# Patient Record
Sex: Male | Born: 1970
Health system: Southern US, Community
[De-identification: ages and names within clinical notes are randomized; demographics above are authoritative.]

## PROBLEM LIST (undated history)

## (undated) ENCOUNTER — Emergency Department (HOSPITAL_BASED_OUTPATIENT_CLINIC_OR_DEPARTMENT_OTHER): Admission: EM | Payer: Commercial Managed Care - PPO

## (undated) DIAGNOSIS — K859 Acute pancreatitis without necrosis or infection, unspecified: Secondary | ICD-10-CM

## (undated) DIAGNOSIS — R0789 Other chest pain: Secondary | ICD-10-CM

## (undated) DIAGNOSIS — J189 Pneumonia, unspecified organism: Secondary | ICD-10-CM

## (undated) DIAGNOSIS — I4891 Unspecified atrial fibrillation: Secondary | ICD-10-CM

## (undated) DIAGNOSIS — I471 Supraventricular tachycardia, unspecified: Secondary | ICD-10-CM

## (undated) DIAGNOSIS — F419 Anxiety disorder, unspecified: Secondary | ICD-10-CM

## (undated) DIAGNOSIS — I499 Cardiac arrhythmia, unspecified: Secondary | ICD-10-CM

## (undated) DIAGNOSIS — I498 Other specified cardiac arrhythmias: Secondary | ICD-10-CM

## (undated) DIAGNOSIS — J45909 Unspecified asthma, uncomplicated: Secondary | ICD-10-CM

## (undated) DIAGNOSIS — R0683 Snoring: Secondary | ICD-10-CM

## (undated) DIAGNOSIS — R002 Palpitations: Secondary | ICD-10-CM

## (undated) DIAGNOSIS — I214 Non-ST elevation (NSTEMI) myocardial infarction: Secondary | ICD-10-CM

## (undated) DIAGNOSIS — R0602 Shortness of breath: Secondary | ICD-10-CM

## (undated) DIAGNOSIS — I456 Pre-excitation syndrome: Secondary | ICD-10-CM

## (undated) DIAGNOSIS — K219 Gastro-esophageal reflux disease without esophagitis: Secondary | ICD-10-CM

## (undated) DIAGNOSIS — E119 Type 2 diabetes mellitus without complications: Secondary | ICD-10-CM

## (undated) HISTORY — DX: Other specified cardiac arrhythmias: I49.8

## (undated) HISTORY — PX: WRIST FRACTURE SURGERY: SHX121

## (undated) HISTORY — DX: Shortness of breath: R06.02

## (undated) HISTORY — DX: Anxiety disorder, unspecified: F41.9

## (undated) HISTORY — DX: Palpitations: R00.2

## (undated) HISTORY — DX: Other chest pain: R07.89

## (undated) HISTORY — DX: Pre-excitation syndrome: I45.6

## (undated) HISTORY — PX: FRACTURE SURGERY: SHX138

## (undated) HISTORY — DX: Snoring: R06.83

## (undated) HISTORY — PX: CARDIAC CATHETERIZATION: SHX172

## (undated) HISTORY — DX: Unspecified atrial fibrillation: I48.91

## (undated) HISTORY — PX: ATRIAL FIBRILLATION ABLATION: SHX5732

## (undated) HISTORY — DX: Morbid (severe) obesity due to excess calories: E66.01

## (undated) HISTORY — DX: Type 2 diabetes mellitus without complications: E11.9

---

## 2009-07-05 ENCOUNTER — Emergency Department (HOSPITAL_COMMUNITY): Admission: EM | Admit: 2009-07-05 | Discharge: 2009-07-06 | Payer: Self-pay | Admitting: Emergency Medicine

## 2010-11-19 LAB — POCT CARDIAC MARKERS
CKMB, poc: 1.4 ng/mL (ref 1.0–8.0)
CKMB, poc: 1.9 ng/mL (ref 1.0–8.0)
Myoglobin, poc: 66.2 ng/mL (ref 12–200)

## 2010-11-19 LAB — CBC
HCT: 44.8 % (ref 39.0–52.0)
Hemoglobin: 15.3 g/dL (ref 13.0–17.0)
MCHC: 34.2 g/dL (ref 30.0–36.0)
MCV: 94.5 fL (ref 78.0–100.0)
RBC: 4.74 MIL/uL (ref 4.22–5.81)

## 2010-11-19 LAB — DIFFERENTIAL
Basophils Relative: 2 % — ABNORMAL HIGH (ref 0–1)
Eosinophils Absolute: 0.2 10*3/uL (ref 0.0–0.7)
Eosinophils Relative: 1 % (ref 0–5)
Monocytes Absolute: 1.2 10*3/uL — ABNORMAL HIGH (ref 0.1–1.0)
Monocytes Relative: 6 % (ref 3–12)

## 2010-11-19 LAB — BASIC METABOLIC PANEL
CO2: 29 mEq/L (ref 19–32)
Chloride: 102 mEq/L (ref 96–112)
GFR calc Af Amer: >60 mL/min (ref >60)
Sodium: 138 mEq/L (ref 135–145)

## 2013-05-01 DIAGNOSIS — R079 Chest pain, unspecified: Secondary | ICD-10-CM | POA: Insufficient documentation

## 2013-07-12 DIAGNOSIS — M25559 Pain in unspecified hip: Secondary | ICD-10-CM | POA: Insufficient documentation

## 2013-07-12 DIAGNOSIS — M87059 Idiopathic aseptic necrosis of unspecified femur: Secondary | ICD-10-CM | POA: Insufficient documentation

## 2013-07-12 DIAGNOSIS — K219 Gastro-esophageal reflux disease without esophagitis: Secondary | ICD-10-CM | POA: Insufficient documentation

## 2014-05-08 ENCOUNTER — Inpatient Hospital Stay (HOSPITAL_COMMUNITY)
Admission: EM | Admit: 2014-05-08 | Discharge: 2014-05-10 | DRG: 309 | Disposition: A | Discharge status: Home / Self Care | Payer: Medicare Other | Attending: Cardiology | Admitting: Cardiology

## 2014-05-08 ENCOUNTER — Emergency Department (HOSPITAL_COMMUNITY): Payer: Medicare Other

## 2014-05-08 ENCOUNTER — Encounter (HOSPITAL_COMMUNITY): Payer: Self-pay | Admitting: Emergency Medicine

## 2014-05-08 DIAGNOSIS — I498 Other specified cardiac arrhythmias: Principal | ICD-10-CM | POA: Diagnosis present

## 2014-05-08 DIAGNOSIS — Z79899 Other long term (current) drug therapy: Secondary | ICD-10-CM

## 2014-05-08 DIAGNOSIS — Z6841 Body Mass Index (BMI) 40.0 and over, adult: Secondary | ICD-10-CM

## 2014-05-08 DIAGNOSIS — F17200 Nicotine dependence, unspecified, uncomplicated: Secondary | ICD-10-CM | POA: Diagnosis present

## 2014-05-08 DIAGNOSIS — I4891 Unspecified atrial fibrillation: Secondary | ICD-10-CM | POA: Diagnosis present

## 2014-05-08 DIAGNOSIS — R0789 Other chest pain: Secondary | ICD-10-CM

## 2014-05-08 DIAGNOSIS — I456 Pre-excitation syndrome: Secondary | ICD-10-CM

## 2014-05-08 DIAGNOSIS — E119 Type 2 diabetes mellitus without complications: Secondary | ICD-10-CM | POA: Diagnosis present

## 2014-05-08 DIAGNOSIS — Z7982 Long term (current) use of aspirin: Secondary | ICD-10-CM

## 2014-05-08 DIAGNOSIS — Z8679 Personal history of other diseases of the circulatory system: Secondary | ICD-10-CM | POA: Diagnosis present

## 2014-05-08 DIAGNOSIS — R0602 Shortness of breath: Secondary | ICD-10-CM

## 2014-05-08 DIAGNOSIS — R002 Palpitations: Secondary | ICD-10-CM

## 2014-05-08 DIAGNOSIS — I471 Supraventricular tachycardia: Secondary | ICD-10-CM

## 2014-05-08 DIAGNOSIS — J45909 Unspecified asthma, uncomplicated: Secondary | ICD-10-CM | POA: Diagnosis present

## 2014-05-08 DIAGNOSIS — F411 Generalized anxiety disorder: Secondary | ICD-10-CM | POA: Diagnosis present

## 2014-05-08 HISTORY — DX: Shortness of breath: R06.02

## 2014-05-08 HISTORY — DX: Other chest pain: R07.89

## 2014-05-08 HISTORY — DX: Palpitations: R00.2

## 2014-05-08 HISTORY — DX: Supraventricular tachycardia, unspecified: I47.10

## 2014-05-08 HISTORY — DX: Supraventricular tachycardia: I47.1

## 2014-05-08 HISTORY — DX: Unspecified asthma, uncomplicated: J45.909

## 2014-05-08 LAB — BASIC METABOLIC PANEL
Anion gap: 15 (ref 5–15)
BUN: 13 mg/dL (ref 6–23)
CHLORIDE: 99 meq/L (ref 96–112)
CO2: 25 mEq/L (ref 19–32)
CREATININE: 0.64 mg/dL (ref 0.50–1.35)
Calcium: 9.1 mg/dL (ref 8.4–10.5)
GFR calc non Af Amer: >90 mL/min (ref >90)
GLUCOSE: 168 mg/dL — AB (ref 70–99)
POTASSIUM: 4.1 meq/L (ref 3.7–5.3)
Sodium: 139 mEq/L (ref 137–147)

## 2014-05-08 LAB — CBC
HEMATOCRIT: 45.5 % (ref 39.0–52.0)
HEMOGLOBIN: 16.2 g/dL (ref 13.0–17.0)
MCH: 31.5 pg (ref 26.0–34.0)
MCHC: 35.6 g/dL (ref 30.0–36.0)
MCV: 88.3 fL (ref 78.0–100.0)
Platelets: 287 10*3/uL (ref 150–400)
RBC: 5.15 MIL/uL (ref 4.22–5.81)
RDW: 12.7 % (ref 11.5–15.5)
WBC: 16.8 10*3/uL — ABNORMAL HIGH (ref 4.0–10.5)

## 2014-05-08 LAB — I-STAT TROPONIN, ED: Troponin i, poc: 0 ng/mL (ref 0.00–0.08)

## 2014-05-08 MED ORDER — DILTIAZEM HCL 100 MG IV SOLR
5.0000 mg/h | INTRAVENOUS | Status: DC
  Administered 2014-05-08: 10 mg/h via INTRAVENOUS
  Administered 2014-05-09: 15 mg/h via INTRAVENOUS
  Administered 2014-05-09: 20 mg/h via INTRAVENOUS
  Administered 2014-05-09 (×2): 15 mg/h via INTRAVENOUS
  Administered 2014-05-09: 20 mg/h via INTRAVENOUS
  Administered 2014-05-10: 15 mg/h via INTRAVENOUS

## 2014-05-08 MED ORDER — DILTIAZEM LOAD VIA INFUSION
10.0000 mg | Freq: Once | INTRAVENOUS | Status: AC
  Administered 2014-05-08: 10 mg via INTRAVENOUS
  Filled 2014-05-08: qty 10

## 2014-05-08 NOTE — ED Notes (Signed)
Pt in NSR, hr 82 after Cardizem bolus; pads in place. Reports hx of defibrillation in the past for SVT. Has had extensive workup for same without etiology. Pt reports cardiologist has started patient on various medications which "control rhythm, then stop."

## 2014-05-08 NOTE — ED Notes (Signed)
Pads placed on pt for frequent runs of SVT

## 2014-05-08 NOTE — ED Notes (Signed)
Pt to ED via [facility] c/o headache, shortness of breath, and chest pain. Pt reports intermittent headache starting today, described as "my head feels like it's going to blow off." Pt intermittently in SVT on assessment; vagals then converts to a-fib. Dr.Delo at bedside

## 2014-05-08 NOTE — ED Provider Notes (Signed)
CSN: [id number]     Arrival date & time 05/08/14  2203 History   First MD Initiated Contact with Patient 05/08/14 2226     Chief Complaint  Patient presents with  . Shortness of Breath  . Headache  . Chest Pain     (Consider location/radiation/quality/duration/timing/severity/associated sxs/prior Treatment) HPI Comments: Patient is a 43 year old male with history of paroxysmal atrial fibrillation status post multiple ablations in the past. He is followed by a cardiologist at [facility]. He presents today with complaints of tightness in his chest, palpitations and shortness of breath that has been occurring intermittently throughout the evening. He feels himself go into A. fib, then converts himself with vagal maneuvers. Shortly thereafter the arrhythmia returns.  Patient is a 43 y.o. male presenting with chest pain. The history is provided by the patient.  Chest Pain Pain location:  Substernal area Pain quality: tightness   Pain radiates to:  Does not radiate Pain radiates to the back: no   Pain severity:  Moderate Onset quality:  Sudden Duration:  6 hours Timing:  Intermittent Progression:  Worsening Chronicity:  Recurrent Relieved by:  Nothing Worsened by:  Nothing tried Ineffective treatments:  None tried   Past Medical History  Diagnosis Date  . Asthma   . SVT (supraventricular tachycardia)    Past Surgical History  Procedure Laterality Date  . Ablation    . Wrist surgery     History reviewed. No pertinent family history. History  Substance Use Topics  . Smoking status: Current Every Day Smoker  . Smokeless tobacco: Not on file  . Alcohol Use: Yes    Review of Systems  Cardiovascular: Positive for chest pain.  All other systems reviewed and are negative.     Allergies  Prednisone  Home Medications   Prior to Admission medications   Not on File   BP 127/64  Pulse 88  Temp(Src) 97.9 F (36.6 C)  Resp 18  SpO2 99% Physical Exam  Nursing note and  vitals reviewed. Constitutional: He is oriented to person, place, and time. He appears well-developed and well-nourished. No distress.  HENT:  Head: Normocephalic and atraumatic.  Mouth/Throat: Oropharynx is clear and moist.  Neck: Normal range of motion. Neck supple.  Cardiovascular: Normal rate, regular rhythm and normal heart sounds.   No murmur heard. Pulmonary/Chest: Effort normal and breath sounds normal. No respiratory distress. He has no wheezes.  Abdominal: Soft. Bowel sounds are normal. He exhibits no distension. There is no tenderness.  Musculoskeletal: Normal range of motion. He exhibits no edema.  Lymphadenopathy:    He has no cervical adenopathy.  Neurological: He is alert and oriented to person, place, and time.  Skin: Skin is warm and dry. He is not diaphoretic.    ED Course  Procedures (including critical care time) Labs Review Labs Reviewed - No data to display  Imaging Review No results found.   Date: 05/10/2014  Rate: 90's  Rhythm: sinus rhythm  QRS Axis: normal  Intervals: normal  ST/T Wave abnormalities: none  Conduction Disutrbances:none  Narrative Interpretation:   Old EKG Reviewed: unchanged    MDM   Final diagnoses:  None    Patient presents with complaints of palpitations and chest pain.  He has a history of paroxysmal afib and multiple ablations in the past at [facility].  He has gone in and out of afib with RVR on several occasions since arriving to the ED.  Labs are essentially unremarkable.  Troponin is negative.  I have initiated  a cardizem drip and will consult cardiology for admission.  CRITICAL CARE Performed by: Geoffery Lyons Total critical care time: 30 minutes Critical care time was exclusive of separately billable procedures and treating other patients. Critical care was necessary to treat or prevent imminent or life-threatening deterioration. Critical care was time spent personally by me on the following activities: development of  treatment plan with patient and/or surrogate as well as nursing, discussions with consultants, evaluation of patient's response to treatment, examination of patient, obtaining history from patient or surrogate, ordering and performing treatments and interventions, ordering and review of laboratory studies, ordering and review of radiographic studies, pulse oximetry and re-evaluation of patient's condition.     Geoffery Lyons, MD 05/10/14 [phone]

## 2014-05-09 DIAGNOSIS — R0789 Other chest pain: Secondary | ICD-10-CM | POA: Diagnosis present

## 2014-05-09 DIAGNOSIS — F17200 Nicotine dependence, unspecified, uncomplicated: Secondary | ICD-10-CM | POA: Diagnosis present

## 2014-05-09 DIAGNOSIS — J45909 Unspecified asthma, uncomplicated: Secondary | ICD-10-CM | POA: Diagnosis present

## 2014-05-09 DIAGNOSIS — I498 Other specified cardiac arrhythmias: Secondary | ICD-10-CM | POA: Diagnosis present

## 2014-05-09 DIAGNOSIS — I4891 Unspecified atrial fibrillation: Secondary | ICD-10-CM | POA: Diagnosis present

## 2014-05-09 DIAGNOSIS — Z6841 Body Mass Index (BMI) 40.0 and over, adult: Secondary | ICD-10-CM | POA: Diagnosis not present

## 2014-05-09 DIAGNOSIS — Z8679 Personal history of other diseases of the circulatory system: Secondary | ICD-10-CM | POA: Diagnosis present

## 2014-05-09 DIAGNOSIS — F411 Generalized anxiety disorder: Secondary | ICD-10-CM | POA: Diagnosis present

## 2014-05-09 DIAGNOSIS — Z79899 Other long term (current) drug therapy: Secondary | ICD-10-CM | POA: Diagnosis not present

## 2014-05-09 DIAGNOSIS — I369 Nonrheumatic tricuspid valve disorder, unspecified: Secondary | ICD-10-CM

## 2014-05-09 DIAGNOSIS — I456 Pre-excitation syndrome: Secondary | ICD-10-CM

## 2014-05-09 DIAGNOSIS — Z7982 Long term (current) use of aspirin: Secondary | ICD-10-CM | POA: Diagnosis not present

## 2014-05-09 DIAGNOSIS — E119 Type 2 diabetes mellitus without complications: Secondary | ICD-10-CM | POA: Diagnosis present

## 2014-05-09 LAB — CBC
HEMATOCRIT: 44.8 % (ref 39.0–52.0)
HEMOGLOBIN: 15.6 g/dL (ref 13.0–17.0)
MCH: 31.4 pg (ref 26.0–34.0)
MCHC: 34.8 g/dL (ref 30.0–36.0)
MCV: 90.1 fL (ref 78.0–100.0)
Platelets: 265 10*3/uL (ref 150–400)
RBC: 4.97 MIL/uL (ref 4.22–5.81)
RDW: 13 % (ref 11.5–15.5)
WBC: 15 10*3/uL — ABNORMAL HIGH (ref 4.0–10.5)

## 2014-05-09 LAB — BASIC METABOLIC PANEL
Anion gap: 11 (ref 5–15)
BUN: 12 mg/dL (ref 6–23)
CHLORIDE: 98 meq/L (ref 96–112)
CO2: 30 meq/L (ref 19–32)
CREATININE: 0.81 mg/dL (ref 0.50–1.35)
Calcium: 8.9 mg/dL (ref 8.4–10.5)
GFR calc non Af Amer: >90 mL/min (ref >90)
GLUCOSE: 242 mg/dL — AB (ref 70–99)
Potassium: 4.4 mEq/L (ref 3.7–5.3)
Sodium: 139 mEq/L (ref 137–147)

## 2014-05-09 LAB — MRSA PCR SCREENING: MRSA BY PCR: NEGATIVE

## 2014-05-09 LAB — MAGNESIUM: MAGNESIUM: 1.6 mg/dL (ref 1.5–2.5)

## 2014-05-09 LAB — LIPID PANEL
CHOL/HDL RATIO: 4.1 ratio
Cholesterol: 161 mg/dL (ref 0–200)
HDL: 39 mg/dL — ABNORMAL LOW (ref >39)
LDL Cholesterol: 82 mg/dL (ref 0–99)
TRIGLYCERIDES: 199 mg/dL — AB (ref <150)
VLDL: 40 mg/dL (ref 0–40)

## 2014-05-09 LAB — PROTIME-INR
INR: 1.04 (ref 0.00–1.49)
Prothrombin Time: 13.6 seconds (ref 11.6–15.2)

## 2014-05-09 LAB — T4, FREE: FREE T4: 1.17 ng/dL (ref 0.80–1.80)

## 2014-05-09 LAB — TSH: TSH: 3.09 u[IU]/mL (ref 0.350–4.500)

## 2014-05-09 LAB — HEMOGLOBIN A1C
Hgb A1c MFr Bld: 8.9 % — ABNORMAL HIGH (ref <5.7)
MEAN PLASMA GLUCOSE: 209 mg/dL — AB (ref <117)

## 2014-05-09 MED ORDER — ACETAMINOPHEN 325 MG PO TABS
650.0000 mg | ORAL_TABLET | ORAL | Status: DC | PRN
  Administered 2014-05-09 (×2): 650 mg via ORAL
  Filled 2014-05-09 (×2): qty 2

## 2014-05-09 MED ORDER — ADULT MULTIVITAMIN W/MINERALS CH
1.0000 | ORAL_TABLET | Freq: Every day | ORAL | Status: DC
  Administered 2014-05-09 – 2014-05-10 (×2): 1 via ORAL
  Filled 2014-05-09 (×2): qty 1

## 2014-05-09 MED ORDER — ALPRAZOLAM 0.25 MG PO TABS: 0.2500 mg | ORAL_TABLET | Freq: Three times a day (TID) | ORAL | Status: DC | PRN

## 2014-05-09 MED ORDER — NICOTINE 21 MG/24HR TD PT24
21.0000 mg | MEDICATED_PATCH | Freq: Every day | TRANSDERMAL | Status: DC
  Administered 2014-05-09 – 2014-05-10 (×2): 21 mg via TRANSDERMAL
  Filled 2014-05-09 (×2): qty 1

## 2014-05-09 MED ORDER — LEVALBUTEROL HCL 0.63 MG/3ML IN NEBU
0.6300 mg | INHALATION_SOLUTION | Freq: Four times a day (QID) | RESPIRATORY_TRACT | Status: DC | PRN
  Administered 2014-05-09 – 2014-05-10 (×4): 0.63 mg via RESPIRATORY_TRACT
  Filled 2014-05-09 (×3): qty 3

## 2014-05-09 MED ORDER — MONTELUKAST SODIUM 10 MG PO TABS
10.0000 mg | ORAL_TABLET | Freq: Every day | ORAL | Status: DC
  Administered 2014-05-09: 10 mg via ORAL
  Filled 2014-05-09 (×2): qty 1

## 2014-05-09 MED ORDER — LEVALBUTEROL HCL 0.63 MG/3ML IN NEBU
INHALATION_SOLUTION | RESPIRATORY_TRACT | Status: AC
  Filled 2014-05-09: qty 3

## 2014-05-09 MED ORDER — ALBUTEROL SULFATE (2.5 MG/3ML) 0.083% IN NEBU: 2.5000 mg | INHALATION_SOLUTION | Freq: Four times a day (QID) | RESPIRATORY_TRACT | Status: DC | PRN

## 2014-05-09 MED ORDER — LORATADINE 10 MG PO TABS
10.0000 mg | ORAL_TABLET | Freq: Every day | ORAL | Status: DC
  Administered 2014-05-09 – 2014-05-10 (×2): 10 mg via ORAL
  Filled 2014-05-09 (×2): qty 1

## 2014-05-09 MED ORDER — ONDANSETRON HCL 4 MG/2ML IJ SOLN: 4.0000 mg | Freq: Four times a day (QID) | INTRAMUSCULAR | Status: DC | PRN

## 2014-05-09 MED ORDER — CENTRUM PO CHEW: 1.0000 | CHEWABLE_TABLET | Freq: Every day | ORAL | Status: DC

## 2014-05-09 MED ORDER — DILTIAZEM HCL ER COATED BEADS 180 MG PO CP24
180.0000 mg | ORAL_CAPSULE | Freq: Two times a day (BID) | ORAL | Status: DC
  Administered 2014-05-09 – 2014-05-10 (×3): 180 mg via ORAL
  Filled 2014-05-09 (×5): qty 1

## 2014-05-09 MED ORDER — ALPRAZOLAM 0.5 MG PO TABS: 2.0000 mg | ORAL_TABLET | Freq: Three times a day (TID) | ORAL | Status: DC | PRN

## 2014-05-09 MED ORDER — PANTOPRAZOLE SODIUM 40 MG PO TBEC
40.0000 mg | DELAYED_RELEASE_TABLET | Freq: Every day | ORAL | Status: DC
Start: 2014-05-09 — End: 2014-05-10
  Administered 2014-05-09 – 2014-05-10 (×2): 40 mg via ORAL
  Filled 2014-05-09 (×2): qty 1

## 2014-05-09 MED ORDER — ASPIRIN EC 81 MG PO TBEC
81.0000 mg | DELAYED_RELEASE_TABLET | Freq: Every day | ORAL | Status: DC
  Administered 2014-05-09 – 2014-05-10 (×2): 81 mg via ORAL
  Filled 2014-05-09 (×2): qty 1

## 2014-05-09 MED ORDER — LEVALBUTEROL TARTRATE 45 MCG/ACT IN AERO: 1.0000 | INHALATION_SPRAY | Freq: Four times a day (QID) | RESPIRATORY_TRACT | Status: DC | PRN

## 2014-05-09 MED ORDER — ALPRAZOLAM 0.5 MG PO TABS
1.0000 mg | ORAL_TABLET | Freq: Three times a day (TID) | ORAL | Status: DC | PRN
  Administered 2014-05-09 (×2): 1 mg via ORAL
  Filled 2014-05-09 (×2): qty 2

## 2014-05-09 MED ORDER — ENOXAPARIN SODIUM 100 MG/ML ~~LOC~~ SOLN
85.0000 mg | SUBCUTANEOUS | Status: DC
  Administered 2014-05-09 – 2014-05-10 (×2): 85 mg via SUBCUTANEOUS
  Filled 2014-05-09 (×3): qty 1

## 2014-05-09 MED ORDER — DRONEDARONE HCL 400 MG PO TABS
400.0000 mg | ORAL_TABLET | Freq: Two times a day (BID) | ORAL | Status: DC
  Administered 2014-05-09 – 2014-05-10 (×3): 400 mg via ORAL
  Filled 2014-05-09 (×5): qty 1

## 2014-05-09 NOTE — H&P (Addendum)
Patient ID: Dean [name] MRN: [id number], DOB/AGE: 1971-02-13   Admit date: 05/08/2014   Primary Physician: No PCP Per Patient Primary Cardiologist: None  Pt. 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   Problem List  Past Medical History  Diagnosis Date  . Asthma   . SVT (supraventricular tachycardia)     Past Surgical History  Procedure Laterality Date  . Ablation    . Wrist surgery       Allergies  Allergies  Allergen Reactions  . Prednisone Other (See Comments)    "goes crazy"    HPI  44M with obesity, WPW and multiple atrial arrhythmias s/p ablation who presents with recurrent atrial arrhythmias.   Dean [name] reports he was diagnosed with WPW as a teenager and underwent an ablation at the age of 77. He reports that over the years he has been plaqued by many atrial arhythmias. He has previously failed flecainide (and he things propafenone) and is now on Multaq but is taking this daily. He has had a total of 6 ablations, all at [facility]. He reports that until a few days ago, he had been arrythmia free since his last ablation 3 years ago.   He states that over the past few days he has felt his "heart racing" like his "head was going to fall off." Episodes had associated chest tightness and SOB. In the setting of recurrent episodes he presented to the ED for evaluation. He was found to have many runs of fast SVT (up to over 200bpm). He was started on a diltizem drip and cardiology was consulted.   Of note, he is a a 1PPD smoker. Denies recreational drugs. Drinks a cup of coffee in the AM, otherwise no caffeine or energy drinks. His fiance states that he frequently snores and has apneic events. He has not been tested for OSA.   Of note, we wishes to transfer his CV care to [facility].   Home Medications  Prior to Admission medications   Medication Sig Start Date End Date Taking? Authorizing  Provider  alprazolam Prudy Feeler) 2 MG tablet Take 2 mg by mouth at bedtime as needed for sleep.    Yes Historical Provider, MD  aspirin 81 MG tablet Take 81 mg by mouth daily.   Yes Historical Provider, MD  budesonide-formoterol (SYMBICORT) 160-4.5 MCG/ACT inhaler Inhale 2 puffs into the lungs daily as needed (shortness of breath).    Yes Historical Provider, MD  cetirizine (ZYRTEC) 10 MG tablet Take 10 mg by mouth daily.   Yes Historical Provider, MD  diltiazem (CARDIZEM CD) 180 MG 24 hr capsule Take 180 mg by mouth 2 (two) times daily.   Yes Historical Provider, MD  dronedarone (MULTAQ) 400 MG tablet Take 400 mg by mouth daily.   Yes Historical Provider, MD  levalbuterol [facility] HFA) 45 MCG/ACT inhaler Inhale 1-2 puffs into the lungs every 6 (six) hours as needed for wheezing or shortness of breath.   Yes Historical Provider, MD  montelukast (SINGULAIR) 10 MG tablet Take 10 mg by mouth at bedtime.   Yes Historical Provider, MD  multivitamin-iron-minerals-folic acid (CENTRUM) chewable tablet Chew 1 tablet by mouth daily.   Yes Historical Provider, MD  omeprazole (PRILOSEC) 20 MG capsule Take 20 mg by mouth 2 (two) times daily.   Yes Historical Provider, MD    Family History  Father and grandmother with fatal MI. Father with unspecified heart rhythm problem.  Social History  History   Social History  . Marital Status: Single    Spouse Name: N/A    Number of Children: N/A  . Years of Education: N/A   Occupational History  . Not on file.   Social History Main Topics  . Smoking status: Current Every Day Smoker  . Smokeless tobacco: Not on file  . Alcohol Use: Yes  . Drug Use: No  . Sexual Activity: Not on file   Other Topics Concern  . Not on file   Social History Narrative  . No narrative on file     Review of Systems General:  No chills, fever, night sweats or weight changes.  Cardiovascular: See HPI Dermatological: No rash, lesions/masses Respiratory: No cough Urologic:  No hematuria, dysuria Abdominal:   No nausea, vomiting, diarrhea, bright red blood per rectum, melena, or hematemesis Neurologic:  No visual changes, wkns, changes in mental status. All other systems reviewed and are otherwise negative except as noted above.  Physical Exam  Blood pressure 124/58, pulse 43, temperature 97.4 F (36.3 C), temperature source Oral, resp. rate 15, height  (1.981 m), weight 170.4 kg (375 lb 10.6 oz), SpO2 93.00%.  General: Pleasant, NAD, obese Psych: Normal affect. Neuro: Alert and oriented X 3. Moves all extremities spontaneously. HEENT: Normal  Neck: Supple without bruits or JVD. Lungs:  Resp regular and unlabored, occasional expiratory wheeze Heart: RRR no s3, s4, or murmurs. Abdomen: Soft, non-tender, non-distended, BS + x 4.  Extremities: No clubbing, cyanosis or edema. DP/PT/Radials 2+ and equal bilaterally.  Labs  Troponin (Point of Care Test)  Recent Labs  05/08/14 2240  TROPIPOC 0.00   No results found for this basename: CKTOTAL, CKMB, TROPONINI,  in the last 72 hours Lab Results  Component Value Date   WBC 16.8* 05/08/2014   HGB 16.2 05/08/2014   HCT 45.5 05/08/2014   MCV 88.3 05/08/2014   PLT 287 05/08/2014    Recent Labs Lab 05/08/14 2222  NA 139  K 4.1  CL 99  CO2 25  BUN 13  CREATININE 0.64  CALCIUM 9.1  GLUCOSE 168*   No results found for this basename: CHOL, HDL, LDLCALC, TRIG   No results found for this basename: DDIMER     Radiology/Studies  Dg Chest Port 1 View  05/08/2014   CLINICAL DATA:  Chest pain and shortness of breath  EXAM: PORTABLE CHEST - 1 VIEW  COMPARISON:  06/1909  FINDINGS: Low lung volumes. Mild bilateral perihilar peribronchial wall thickening. Heart size normal. Vascular pattern normal. Lungs clear without effusion.  IMPRESSION: Mild chronic appearing bronchitic change.  Otherwise negative.   Electronically Signed   By: Esperanza Heir M.D.   On: 05/08/2014 22:55    ECG NSR, PAC. No delta wave.    Tele - Short bursts of AF - Occasional sustained narrow complex long RP tachs - Occasional WCT of unclear etiology  ASSESSMENT AND PLAN 49M with obesity, WPW and multiple atrial arrhythmias s/p ablation who presents with recurrent atrial arrhythmias. Suspect worsened atrial arrhythmias may be related at least in part to OSA. Bursts of AT are now better controlled on diltiazem drip. CHADSVASC = 0.  Plan Continue dilt drip for now along with home oral dilt Increase dronedarone to  BID [facility] records; will need TTE if no recent echo at [facility] EP consult for possible repeat atrial ablation TSH K>4, Mg>2 Outpatient sleep study Nicotine patch and smoking cessation counseling  Signed, Glori Luis, MD 05/09/2014, 3:57 AM

## 2014-05-09 NOTE — ED Notes (Addendum)
Patient went into VT for greater than 20 beats then went into SVT and vagel maneuvers were unsuccessful. Patient became diaphoretic and developed chest pain. He stated he felt like he was going to pass out. Finally after patient beared down he had a pause and converted to NSR. EDP at bedside. Cardiology notified.

## 2014-05-09 NOTE — Consult Note (Signed)
ELECTROPHYSIOLOGY CONSULT NOTE    Patient ID: Dean [name] MRN: [id number], DOB/AGE: [birth year] 41 y.o.  Admit date: 05/08/2014 Date of Consult: 05-09-14  Primary Physician: No PCP Per Patient Primary Cardiologist: previously [facility], would like to transfer to [facility]  Reason for Consultation: atrial arrhythmias  HPI:  Dean [name] is a 43 y.o. male with a past medical history significant for asthma, atrial arrhythmias, and anxiety.  He has been followed at [facility] since he was 43 years old.  He has had a total of 5 or 6 ablations since that time.  The first being around the age of 47 for what he remembers is WPW. He is unsure of the rhythms that were ablated subsequent to that.  He has been maintained on Multaq for the last 2 and 1/2 years and has been relatively well controlled.  Yesterday, he developed tachy-palpitations, chest tightness and shortness of breath.  He came to [name] for further evaluation.  He has been placed on IV Cardizem with reversion to SR.  Telemetry overnight demonstrated atrial fibrillation with RVR, ventricular rates up to 200 as well as a short RP tachycardia with a cycle length of about .    He is sleeping and difficult to arouse this morning.  His significant other provides most of his history.  Per their report, he has previously failed flecainide and propafenone.  Last echo >2 years ago.  He has had a remote sleep study, but does not remember results.  He does snore and has occasional apnea.   He is a current smoker but denies recreational drug use.    TSH and electrolytes are normal.  Echo pending.   EP has been asked to evaluate for treatment options.  ROS is negative except as outlined above.   Past Medical History  Diagnosis Date  . Asthma   . SVT (supraventricular tachycardia)      Surgical History:  Past Surgical History  Procedure Laterality Date  . Ablation    . Wrist surgery       Prescriptions prior to admission  Medication Sig Dispense  Refill  . alprazolam (XANAX) 2 MG tablet Take 2 mg by mouth at bedtime as needed for sleep.       Marland Kitchen aspirin 81 MG tablet Take 81 mg by mouth daily.      . budesonide-formoterol (SYMBICORT) 160-4.5 MCG/ACT inhaler Inhale 2 puffs into the lungs daily as needed (shortness of breath).       . cetirizine (ZYRTEC) 10 MG tablet Take 10 mg by mouth daily.      Marland Kitchen diltiazem (CARDIZEM CD) 180 MG 24 hr capsule Take 180 mg by mouth 2 (two) times daily.      Marland Kitchen dronedarone (MULTAQ) 400 MG tablet Take 400 mg by mouth daily.      Marland Kitchen levalbuterol (XOPENEX HFA) 45 MCG/ACT inhaler Inhale 1-2 puffs into the lungs every 6 (six) hours as needed for wheezing or shortness of breath.      . montelukast (SINGULAIR) 10 MG tablet Take 10 mg by mouth at bedtime.      . multivitamin-iron-minerals-folic acid (CENTRUM) chewable tablet Chew 1 tablet by mouth daily.      Marland Kitchen omeprazole (PRILOSEC) 20 MG capsule Take 20 mg by mouth 2 (two) times daily.        Inpatient Medications:  . aspirin EC  81 mg Oral Daily  . diltiazem  180 mg Oral BID  . dronedarone  400 mg Oral BID WC  . enoxaparin (LOVENOX)  injection  85 mg Subcutaneous Q24H  . levalbuterol      . loratadine  10 mg Oral Daily  . montelukast  10 mg Oral QHS  . multivitamin with minerals  1 tablet Oral Daily  . nicotine  21 mg Transdermal Daily  . pantoprazole  40 mg Oral Daily    Allergies:  Allergies  Allergen Reactions  . Prednisone Other (See Comments)    "goes crazy"    History   Social History  . Marital Status: Single    Spouse Name: N/A    Number of Children: N/A  . Years of Education: N/A   Occupational History  . Not on file.   Social History Main Topics  . Smoking status: Current Every Day Smoker  . Smokeless tobacco: Not on file  . Alcohol Use: Yes  . Drug Use: No  . Sexual Activity: Not on file   Other Topics Concern  . Not on file   Social History Narrative  . No narrative on file     Family History - pt unable to  provide  BP 129/55  Pulse 66  Temp(Src) 97.4 F (36.3 C) (Oral)  Resp 15  Ht  (1.981 m)  Wt 375 lb 10.6 oz (170.4 kg)  BMI 43.42 kg/m2  SpO2 99%  Physical Exam: Morbidly obese appearing 43 yo man, NAD HEENT: Unremarkable,[place], AT Neck:  6 JVD, no thyromegally Back:  No CVA tenderness Lungs:  Clear with no wheezes, rales, or rhonchi HEART:  Regular rate rhythm, no murmurs, no rubs, no clicks Abd:  soft, obese, positive bowel sounds, no organomegally, no rebound, no guarding Ext:  2 plus pulses, no edema, no cyanosis, no clubbing Skin:  No rashes no nodules Neuro:  CN II through XII intact, motor grossly intact    Labs:   Lab Results  Component Value Date   WBC 15.0* 05/09/2014   HGB 15.6 05/09/2014   HCT 44.8 05/09/2014   MCV 90.1 05/09/2014   PLT 265 05/09/2014    Recent Labs Lab 05/09/14 0436  NA 139  K 4.4  CL 98  CO2 30  BUN 12  CREATININE 0.81  CALCIUM 8.9  GLUCOSE 242*    Radiology/Studies: Dg Chest Port 1 View 05/08/2014   CLINICAL DATA:  Chest pain and shortness of breath  EXAM: PORTABLE CHEST - 1 VIEW  COMPARISON:  06/1909  FINDINGS: Low lung volumes. Mild bilateral perihilar peribronchial wall thickening. Heart size normal. Vascular pattern normal. Lungs clear without effusion.  IMPRESSION: Mild chronic appearing bronchitic change.  Otherwise negative.   Electronically Signed   By: Esperanza Heir M.D.   On: 05/08/2014 22:55    GNF:AOZHY rhythm, PACs, normal intervals, QTc 442  TELEMETRY: sinus rhythm, atrial fibrillation with ventricular rates up to 200, short RP tachycardia cycle length  A/P 1. Recurrent SVT and atrial fib 2. WPW syndrome with minimal pre-excitations status post multiple ablations. The patient states that his extra circuit is very close to his normal electrical connection but we do not have any records from [facility]. 3. Morbid obesity 4. DM Rec: He appears to have only been taking Multaq once a day. He has had this  changed to twice daily. I have recommended that he restart twice daily Multaq and we will see how he does. If his arrhythmia cannot be controlled on Multaq, I would switch to amiodarone despite his relative young age. He is physiologically not young. He could be discharge home in the next  24-48 hours if his SVT is controlled.    Leonia Reeves.D.

## 2014-05-09 NOTE — Progress Notes (Signed)
Utilization Review Completed.Dean [name] T9/23/2015  

## 2014-05-09 NOTE — Progress Notes (Signed)
  Echocardiogram 2D Echocardiogram has been performed.  Arvil Chaco 05/09/2014, 11:18 AM

## 2014-05-09 NOTE — Progress Notes (Signed)
eLink Physician-Brief Progress Note Patient Name: Dean [name] DOB: [birth year] MRN: [id number]   Date of Service  05/09/2014  HPI/Events of Note  43 yo man, admitted to ICU with runs of SVT and wide complex tachycardia in setting of HA. He had near syncope on at least one occasion. He is being managed with dilt IV gtt, is stable.   eICU Interventions  No new CCM intervention needed at this time. Will follow on tele     Intervention Category Intermediate Interventions: Other:  Dean [name],Dean S. 05/09/2014, 3:10 AM

## 2014-05-10 DIAGNOSIS — E119 Type 2 diabetes mellitus without complications: Secondary | ICD-10-CM

## 2014-05-10 MED ORDER — DRONEDARONE HCL 400 MG PO TABS: 400.0000 mg | ORAL_TABLET | Freq: Two times a day (BID) | ORAL | Status: DC

## 2014-05-10 NOTE — Progress Notes (Signed)
Patient Name: Dean [name]      SUBJECTIVE:no sighnificant symptons  Past Medical History  Diagnosis Date  . Asthma   . SVT (supraventricular tachycardia)     Scheduled Meds:  Scheduled Meds: . aspirin EC  81 mg Oral Daily  . diltiazem  180 mg Oral BID  . dronedarone  400 mg Oral BID WC  . enoxaparin (LOVENOX) injection  85 mg Subcutaneous Q24H  . loratadine  10 mg Oral Daily  . montelukast  10 mg Oral QHS  . multivitamin with minerals  1 tablet Oral Daily  . nicotine  21 mg Transdermal Daily  . pantoprazole  40 mg Oral Daily   Continuous Infusions: . diltiazem (CARDIZEM) infusion Stopped (05/10/14 0600)   acetaminophen, ALPRAZolam, levalbuterol, ondansetron (ZOFRAN) IV    PHYSICAL EXAM Filed Vitals:   05/10/14 0400 05/10/14 0500 05/10/14 0600 05/10/14 0800  BP: 154/58 151/73 148/73 129/43  Pulse: 70 71 67 69  Temp:      TempSrc:      Resp: Height:      Weight:      SpO2: 93% 96% 96% 96%  Well developed and nourished in no acute distress HENT normal Neck supple with JVP-flat wheezes Regular rate and rhythm, no murmurs or gallops Abd-soft with active BS No Clubbing cyanosis edema Skin-warm and dry A & Oriented  Grossly normal sensory and motor function   TELEMETRY: Reviewed telemetry pt in short runs of short RP tachy   Intake/Output Summary (Last 24 hours) at 05/10/14 0926 Last data filed at 05/10/14 0600  Gross per 24 hour  Intake    865 ml  Output    700 ml  Net    165 ml    LABS: Basic Metabolic Panel:  Recent Labs Lab 05/08/14 2222 05/09/14 0436  NA 139 139  K 4.1 4.4  CL 99 98  CO2 25 30  GLUCOSE 168* 242*  BUN 13 12  CREATININE 0.64 0.81  CALCIUM 9.1 8.9  MG  --  1.6   Cardiac Enzymes: No results found for this basename: CKTOTAL, CKMB, CKMBINDEX, TROPONINI,  in the last 72 hours CBC:  Recent Labs Lab 05/08/14 2222 05/09/14 0436  WBC 16.8* 15.0*  HGB 16.2 15.6  HCT 45.5 44.8  MCV 88.3 90.1    PLT 287 265   PROTIME:  Recent Labs  05/09/14 0436  LABPROT 13.6  INR 1.04   Liver Function Tests: No results found for this basename: AST, ALT, ALKPHOS, BILITOT, PROT, ALBUMIN,  in the last 72 hours No results found for this basename: LIPASE, AMYLASE,  in the last 72 hours BNP: BNP (last 3 results) No results found for this basename: PROBNP,  in the last 8760 hours D-Dimer: No results found for this basename: DDIMER,  in the last 72 hours Hemoglobin A1C:  Recent Labs  05/09/14 0436  HGBA1C 8.9*   Fasting Lipid Panel:  Recent Labs  05/09/14 0436  CHOL 161  HDL 39*  LDLCALC 82  TRIG 409*  CHOLHDL 4.1   Thyroid Function Tests:  Recent Labs  05/09/14 0436  TSH 3.090      ASSESSMENT AND PLAN:  Principal Problem:   SVT (supraventricular tachycardia) Active Problems:   WPW (Wolff-Parkinson-White syndrome)   Type II or unspecified type diabetes mellitus without mention of complication, not stated as uncontrolled  Complex hx   dronaderone resumed  Will discharge to home  And followup with [facility]  in 3-4 weeks Diabetec coordinator consult  Prev lost 80 lbs on low carb diet and DM resolved Signed, Sherryl Manges MD  05/10/2014

## 2014-05-10 NOTE — Discharge Summary (Signed)
CARDIOLOGY DISCHARGE SUMMARY   Patient ID: Dean [name] MRN: [id number] DOB/AGE: 1971-01-10 43 y.o.  Admit date: 05/08/2014 Discharge date: 05/10/2014  PCP: No PCP Per Patient Primary Cardiologist: Dr. Ladona Ridgel  Primary Discharge Diagnosis: SVT (supraventricular tachycardia) Secondary Discharge Diagnosis:    WPW (Wolff-Parkinson-White syndrome)   Type II or unspecified type diabetes mellitus without mention of complication, not stated as uncontrolled  Consults: Electrophysiology  Procedures: 2-D echocardiogram  Hospital Course: Dean [name] is a 43 y.o. male with a history of WPW and multiple atrial arrhythmias with ablation. He was previously followed at [facility]. He had chest tightness and palpitations so he came to the emergency room. He had converted himself with vagal maneuvers several times but the arrhythmia was recurrent. He was found to have recurrent atrial arrhythmias and was admitted for further evaluation and treatment.  He was seen in consult by Dr. Ladona Ridgel with electrophysiology. He was noted to have atrial fibrillation with rapid ventricular response, ventricular rate up to 200 as well as short RP tachycardia, cycle length of 240 ms. He had previously failed flecainide and pop and then. Based on symptomatology, there is also concern for sleep apnea.  His TSH was normal. An echocardiogram was performed that also showed preserved left ventricular function, results below.  He had been on Multaq prior to admission, but was only taking it once a day. It was increased to twice daily, and he will be followed. He could be switched to amiodarone but Dr. Ladona Ridgel was reluctant to do this.  On 09/24, he was seen by Dr. Graciela Husbands and all data were reviewed. His rhythm was more controlled, and he was feeling much better. He was seen by the diabetes coordinator and his A1c was noted to be significantly elevated. He had been on insulin in the past but had been taken off it after his blood  sugars improved. He was encouraged to stick tightly to a diabetic diet and follow up with his primary care physician.  Dr. Graciela Husbands felt no further inpatient workup is indicated and Dean [name] is considered stable for discharge, to follow up as an outpatient.  Labs:   Lab Results  Component Value Date   WBC 15.0* 05/09/2014   HGB 15.6 05/09/2014   HCT 44.8 05/09/2014   MCV 90.1 05/09/2014   PLT 265 05/09/2014     Recent Labs Lab 05/09/14 0436  NA 139  K 4.4  CL 98  CO2 30  BUN 12  CREATININE 0.81  CALCIUM 8.9  GLUCOSE 242*   Lipid Panel     Component Value Date/Time   CHOL 161 05/09/2014 0436   TRIG 199* 05/09/2014 0436   HDL 39* 05/09/2014 0436   CHOLHDL 4.1 05/09/2014 0436   VLDL 40 05/09/2014 0436   LDLCALC 82 05/09/2014 0436    Recent Labs  05/09/14 0436  INR 1.04   Lab Results  Component Value Date   TSH 3.090 05/09/2014   Lab Results  Component Value Date   HGBA1C 8.9* 05/09/2014      Radiology: Dg Chest Port 1 View 05/08/2014   CLINICAL DATA:  Chest pain and shortness of breath  EXAM: PORTABLE CHEST - 1 VIEW  COMPARISON:  06/1909  FINDINGS: Low lung volumes. Mild bilateral perihilar peribronchial wall thickening. Heart size normal. Vascular pattern normal. Lungs clear without effusion.  IMPRESSION: Mild chronic appearing bronchitic change.  Otherwise negative.   Electronically Signed   By: Esperanza Heir M.D.   On: 05/08/2014 22:55  EKG: 05/08/2014 Sinus rhythm Vent. rate 86 BPM PR interval 157 ms QRS duration 85 ms QT/QTc 370/442 ms P-R-T axes 68 68 69  Echo: 05/09/2014 Conclusions - Left ventricle: Abnormal septal motion. The cavity size was normal. Systolic function was normal. The estimated ejection fraction was in the range of 50% to 55%. - Left atrium: The atrium was mildly to moderately dilated. - Atrial septum: No defect or patent foramen ovale was identified.  FOLLOW UP PLANS AND APPOINTMENTS Allergies  Allergen Reactions  . Prednisone Other  (See Comments)    "goes crazy"     Medication List         alprazolam 2 MG tablet  Commonly known as:  XANAX  Take 2 mg by mouth at bedtime as needed for sleep.     aspirin 81 MG tablet  Take 81 mg by mouth daily.     budesonide-formoterol 160-4.5 MCG/ACT inhaler  Commonly known as:  SYMBICORT  Inhale 2 puffs into the lungs daily as needed (shortness of breath).     cetirizine 10 MG tablet  Commonly known as:  ZYRTEC  Take 10 mg by mouth daily.     diltiazem 180 MG 24 hr capsule  Commonly known as:  CARDIZEM CD  Take 180 mg by mouth 2 (two) times daily.     dronedarone 400 MG tablet  Commonly known as:  MULTAQ  Take 1 tablet (400 mg total) by mouth 2 (two) times daily with a meal.     levalbuterol 45 MCG/ACT inhaler  Commonly known as:  XOPENEX HFA  Inhale 1-2 puffs into the lungs every 6 (six) hours as needed for wheezing or shortness of breath.     montelukast 10 MG tablet  Commonly known as:  SINGULAIR  Take 10 mg by mouth at bedtime.     multivitamin-iron-minerals-folic acid chewable tablet  Chew 1 tablet by mouth daily.     omeprazole 20 MG capsule  Commonly known as:  PRILOSEC  Take 20 mg by mouth 2 (two) times daily.        Discharge Instructions   Diet - low sodium heart healthy    Complete by:  As directed      Diet Carb Modified    Complete by:  As directed      Increase activity slowly    Complete by:  As directed           Follow-up Information   Follow up with Lewayne Bunting, MD. (The office will call.)    Specialty:  Cardiology   Contact information:   [address] [place] [address] [phone]       BRING ALL MEDICATIONS WITH YOU TO FOLLOW UP APPOINTMENTS  Time spent with patient to include physician time: 38 min Signed: Theodore Demark, PA-C 05/10/2014, 2:28 PM Co-Sign MD

## 2014-05-10 NOTE — Progress Notes (Signed)
Reviewed AVS with patient and his significant other using teachback method. Pt and significant other deny questions at this time. Belongings including phone and charger sent home with patient. Pt taken out by wheelchair to private vehicle. No complaints at this time.   Dawson Bills, RN

## 2014-05-10 NOTE — Progress Notes (Signed)
Inpatient Diabetes Program Recommendations  AACE/ADA: New Consensus Statement on Inpatient Glycemic Control (2013)  Target Ranges:  Prepandial:   less than 140 mg/dL      Peak postprandial:   less than 180 mg/dL (1-2 hours)      Critically ill patients:  140 - 180 mg/dL   Inpatient Diabetes Program Recommendations HgbA1C: =8.9 This coordinator met with patient to discuss A1C=8.9 and recommend follow-up after discharge with PCP.  Patient reports he has been on insulin before and he has a glucose meter at home.  He has not been monitoring his glucose and has not taken insulin for approximately 3 years because his doctor told him he did not need the insulin anymore.  Recommended lifestyle intervention through healthy eating and exercise.  Also recommended patient monitor glucose at least before breakfast and at bedtime and report to PCP. Patient was given a diabetes meal planning guide. No further questions/concerns at the end of our visit.   Thank you  Raoul Pitch BSN, RN,CDE Inpatient Diabetes Coordinator [phone] (team pager)

## 2014-06-14 ENCOUNTER — Ambulatory Visit: Payer: Medicare Other | Admitting: Internal Medicine

## 2014-06-18 ENCOUNTER — Encounter: Payer: Self-pay | Admitting: Internal Medicine

## 2014-06-18 ENCOUNTER — Ambulatory Visit (INDEPENDENT_AMBULATORY_CARE_PROVIDER_SITE_OTHER): Payer: Medicare Other | Admitting: Internal Medicine

## 2014-06-18 VITALS — BP 160/72 | HR 136 | Ht 78.0 in | Wt 377.6 lb

## 2014-06-18 DIAGNOSIS — I481 Persistent atrial fibrillation: Secondary | ICD-10-CM

## 2014-06-18 DIAGNOSIS — I4819 Other persistent atrial fibrillation: Secondary | ICD-10-CM

## 2014-06-18 NOTE — Patient Instructions (Signed)
Your physician recommends that you schedule a follow-up appointment in: 3-4 months with Dr Taylor  

## 2014-06-18 NOTE — Progress Notes (Signed)
Primary Care Physician: No PCP Per Patient Referring Physician:   [name] is a 43 y.o. male  Here today for EP evaluation with a h/o WPW, s/p several ablations at [facility], morbid obesity, tobacco abuse, ? hypertension and borderline DM. Recently hospitalized for afib with RVR. Recommendations were to increase multaq to 400 mg bid as the patient was taking incorrectly at once a day. He has failed flecainide and tikosyn in the past. Amiodarone was discussed with recent hospitalization but  avoided at this point  due to his young age and possible side effect profile. Chadsvasc score is 1-2 with borderline diabetes, BP today at 160 systolic but he states BP is usually controlled. He states he was on  hctz at one point but it was d/c. He does drink 10-12 beers on the week-end and smokes a pack a day. Drinks many diet mountain dews during the day. Snores, but the wife denies apneic episodes. He does not think he could tolerate mask if sleep study positive. He had BiPAP at hospital and could not tolerate. He did lose a lot of weight several years ago and has lost 4-5 pounds over the last few weeks. Multaq was working well until last few days, when he started noticing afib in the evening. He does feel the palps but is only minimally symptomatic. Can usually break the rhythm with valsalva maneuvers.   Today, he denies symptoms of palpitations, chest pain, shortness of breath, orthopnea, PND, lower extremity edema, dizziness, presyncope, syncope, or neurologic sequela. The patient is tolerating medications without difficulties and is otherwise without complaint today.   Past Medical History  Diagnosis Date  . Asthma   . SVT (supraventricular tachycardia)   . WPW (Wolff-Parkinson-White syndrome)   . Diabetes mellitus, type II   . Atrial arrhythmia     multiples with 5-6 ablations  . Chest tightness 05/08/14  . Palpitations 05/08/14  . Atrial fibrillation with rapid ventricular response       With rates up to 200 as well as short PR tachycardia, cycle length of 240 ms.  . Anxiety   . SOB (shortness of breath) 05/08/14  . Snores     Wil occasional apnea. Had a remote sleep study but does not remember results.  . Morbid obesity    Past Surgical History  Procedure Laterality Date  . Ablation    . Wrist surgery      Current Outpatient Prescriptions  Medication Sig Dispense Refill  . alprazolam (XANAX) 2 MG tablet Take 2 mg by mouth at bedtime as needed for sleep.     Marland Kitchen. aspirin 81 MG tablet Take 81 mg by mouth daily.    . budesonide-formoterol (SYMBICORT) 160-4.5 MCG/ACT inhaler Inhale 2 puffs into the lungs daily as needed (shortness of breath).     . cetirizine (ZYRTEC) 10 MG tablet Take 10 mg by mouth daily.    Marland Kitchen. diltiazem (CARDIZEM CD) 180 MG 24 hr capsule Take 180 mg by mouth 2 (two) times daily.    Marland Kitchen. dronedarone (MULTAQ) 400 MG tablet Take 1 tablet (400 mg total) by mouth 2 (two) times daily with a meal. 60 tablet 6  . levalbuterol (XOPENEX HFA) 45 MCG/ACT inhaler Inhale 1-2 puffs into the lungs every 6 (six) hours as needed for wheezing or shortness of breath.    . montelukast (SINGULAIR) 10 MG tablet Take 10 mg by mouth at bedtime.    . multivitamin-iron-minerals-folic acid (CENTRUM) chewable tablet Chew 1 tablet by mouth daily.    .Marland Kitchen  omeprazole (PRILOSEC) 20 MG capsule Take 20 mg by mouth 2 (two) times daily.     No current facility-administered medications for this visit.    Allergies  Allergen Reactions  . Prednisone Other (See Comments)    "goes crazy"    History   Social History  . Marital Status: Single    Spouse Name: N/A    Number of Children: N/A  . Years of Education: N/A   Occupational History  . Not on file.   Social History Main Topics  . Smoking status: Current Every Day Smoker  . Smokeless tobacco: Not on file  . Alcohol Use: Yes  . Drug Use: No  . Sexual Activity: Not on file   Other Topics Concern  . Not on file   Social  History Narrative    No family history on file.  ROS- All systems are reviewed and negative except as per the HPI above  Physical Exam: Filed Vitals:   06/18/14 1403  BP: 160/72  Pulse: 136  Height: 6\' 6"  (1.981 m)  Weight: 377 lb 9.6 oz (171.278 kg)    GEN- The patient is well appearing, obese, alert and oriented x 3 today.   Head- normocephalic, atraumatic Eyes-  Sclera clear, conjunctiva pink Ears- hearing intact Oropharynx- clear Neck- supple, no JVP Lungs-Scattered wheezes, normal work of breathing. Heart- Regular rate and rhythm, no murmurs, rubs or gallops, PMI not laterally displaced GI- soft, NT, ND, + BS Extremities- no clubbing, cyanosis, or edema MS- no significant deformity or atrophy Skin- no rash or lesion Psych- euthymic mood, full affect Neuro- strength and sensation are intact  EKG- showed SR with brief episode of afib with RVR at 136 bpm.  ECHO-- Left ventricle: Abnormal septal motion. The cavity size was normal. Systolic function was normal. The estimated ejection fraction was in the range of 50% to 55%. - Left atrium: The atrium was mildly to moderately dilated, 45.36  Atrial septum: No defect or patent foramen ovale was identified   Assessment and Plan:  1. WPW s/p ablation at [facility] Patient will sign consent  to have records released to further understand prior procedures. By history he has a midseptal AP, limiting ablation options out of concern for creating complete heart block  2. Afib with RVR Continue Multaq for now. Pt will be very hard to manage due to already failing several antiarrythmic's, s/p several ablations, as well as morbid obesity, tobacco abuse, alcohol and caffeine use, possible sleep apnea. Recommendations are to work on lifestyle efforts as above, continue multaq 400 mg bid, obtain records for further review, and continue with asa for now, with borderline chadsvasc score of 1-2. Ultimately, he will likely  require repeat ablation and may need PPM.  F/u with me in 3 months.

## 2014-06-20 ENCOUNTER — Encounter: Payer: Self-pay | Admitting: Internal Medicine

## 2014-06-25 ENCOUNTER — Encounter: Payer: Self-pay | Admitting: Emergency Medicine

## 2014-10-02 ENCOUNTER — Ambulatory Visit: Payer: Medicare Other | Admitting: Internal Medicine

## 2015-06-03 ENCOUNTER — Telehealth: Payer: Self-pay | Admitting: Internal Medicine

## 2015-06-03 NOTE — Telephone Encounter (Signed)
New Message      Pt's fiance calling stating that pt's PCP wants pt to start taking Zorelto 20 mg and they are wanting to know if that is ok w/ Dr. Ladona Ridgelaylor. Please call back and advise.

## 2015-06-03 NOTE — Telephone Encounter (Signed)
Needs follow up appointment.  Was due to see Dr Ladona Ridgelaylor I Feb 2016 and the appointment was canceled.  I will have Melissa call schedule the patient

## 2015-06-13 ENCOUNTER — Ambulatory Visit: Payer: Medicare Other | Admitting: Internal Medicine

## 2015-07-18 ENCOUNTER — Ambulatory Visit (INDEPENDENT_AMBULATORY_CARE_PROVIDER_SITE_OTHER): Payer: Medicare Other | Admitting: Internal Medicine

## 2015-07-18 ENCOUNTER — Encounter: Payer: Self-pay | Admitting: Internal Medicine

## 2015-07-18 ENCOUNTER — Other Ambulatory Visit: Payer: Self-pay

## 2015-07-18 VITALS — BP 130/82 | HR 150 | Ht 78.0 in | Wt 376.0 lb

## 2015-07-18 DIAGNOSIS — I480 Paroxysmal atrial fibrillation: Secondary | ICD-10-CM | POA: Diagnosis not present

## 2015-07-18 DIAGNOSIS — Z131 Encounter for screening for diabetes mellitus: Secondary | ICD-10-CM | POA: Diagnosis not present

## 2015-07-18 DIAGNOSIS — I10 Essential (primary) hypertension: Secondary | ICD-10-CM | POA: Insufficient documentation

## 2015-07-18 DIAGNOSIS — I456 Pre-excitation syndrome: Secondary | ICD-10-CM

## 2015-07-18 LAB — HEPATIC FUNCTION PANEL
ALT: 55 U/L — ABNORMAL HIGH (ref 9–46)
AST: 37 U/L (ref 10–40)
Albumin: 4.2 g/dL (ref 3.6–5.1)
Alkaline Phosphatase: 133 U/L — ABNORMAL HIGH (ref 40–115)
Bilirubin, Direct: 0.1 mg/dL (ref <=0.2)
Indirect Bilirubin: 0.4 mg/dL (ref 0.2–1.2)
TOTAL PROTEIN: 7 g/dL (ref 6.1–8.1)
Total Bilirubin: 0.5 mg/dL (ref 0.2–1.2)

## 2015-07-18 LAB — BASIC METABOLIC PANEL
BUN: 19 mg/dL (ref 7–25)
CO2: 27 mmol/L (ref 20–31)
Calcium: 9.3 mg/dL (ref 8.6–10.3)
Chloride: 98 mmol/L (ref 98–110)
Creat: 0.87 mg/dL (ref 0.60–1.35)
GLUCOSE: 234 mg/dL — AB (ref 65–99)
Potassium: 5.1 mmol/L (ref 3.5–5.3)
SODIUM: 136 mmol/L (ref 135–146)

## 2015-07-18 LAB — HEMOGLOBIN A1C
Hgb A1c MFr Bld: 9.5 % — ABNORMAL HIGH (ref <5.7)
Mean Plasma Glucose: 226 mg/dL — ABNORMAL HIGH (ref <117)

## 2015-07-18 LAB — TSH: TSH: 2.722 u[IU]/mL (ref 0.350–4.500)

## 2015-07-18 MED ORDER — AMIODARONE HCL 200 MG PO TABS: ORAL_TABLET | ORAL | Status: DC

## 2015-07-18 NOTE — Assessment & Plan Note (Signed)
He is in atrial fib today with a RVR. I am concerned about the development of a tachy induced CM. He has failed multiple AA meds. I have recommended he start Xarelto and amiodarone. He will stop Multaq. I will obtain screening labs. His CHADSVASC score is 1 but I suspect he has DM and will be obtaining a hgb A1C today. I will see him back in 5-6 weeks. I would anticipate checking a 2D echo once his rate is controlled. If he is still out of rhythm, he will need to undergo DCCV.

## 2015-07-18 NOTE — Progress Notes (Signed)
HPI Dean [name] returns today for followup. He is a pleasant 44 yo morbidly obese man with a long h/o tachypalpitations and WPW syndrome. He has undergone multiple catheter ablations but has persistent WPW and has developed worsening atrial fib with a RVR. He does not feel palpitations. He has had progressive PND and orthopnea. The patient got a cold several weeks ago. He denies syncope or chest pain. Minimal peripheral edema. 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  Allergies  Allergen Reactions  . Prednisone Other (See Comments)    "goes crazy"     Current Outpatient Prescriptions  Medication Sig Dispense Refill  . alprazolam (XANAX) 2 MG tablet Take 2 mg by mouth 2 (two) times daily.     Marland Kitchen aspirin 81 MG tablet Take 81 mg by mouth daily.    . budesonide-formoterol (SYMBICORT) 160-4.5 MCG/ACT inhaler Inhale 2 puffs into the lungs daily as needed (shortness of breath).     . cetirizine (ZYRTEC) 10 MG tablet Take 10 mg by mouth daily.    Marland Kitchen diltiazem (CARDIZEM CD) 180 MG 24 hr capsule Take 180 mg by mouth daily.     Marland Kitchen dronedarone (MULTAQ) 400 MG tablet Take 400 mg by mouth daily.    Marland Kitchen levalbuterol (XOPENEX HFA) 45 MCG/ACT inhaler Inhale 1-2 puffs into the lungs every 6 (six) hours as needed for wheezing or shortness of breath.    . montelukast (SINGULAIR) 10 MG tablet Take 10 mg by mouth at bedtime.    . multivitamin-iron-minerals-folic acid (CENTRUM) chewable tablet Chew 1 tablet by mouth daily.    Marland Kitchen omeprazole (PRILOSEC) 20 MG capsule Take 20 mg by mouth 2 (two) times daily.     No current facility-administered medications for this visit.     Past Medical History  Diagnosis Date  . Asthma   . SVT (supraventricular tachycardia) (HCC)   . WPW (Wolff-Parkinson-White syndrome)   . Diabetes mellitus, type II (HCC)   . Atrial arrhythmia     multiples with 5-6 ablations  . Chest tightness 05/08/14  . Palpitations 05/08/14  . Atrial fibrillation with rapid ventricular  response (HCC)     With rates up to 200 as well as short PR tachycardia, cycle length of 240 ms.  . Anxiety   . SOB (shortness of breath) 05/08/14  . Snores     Wil occasional apnea. Had a remote sleep study but does not remember results.  . Morbid obesity (HCC)     ROS:   All systems reviewed and negative except as noted in the HPI.   Past Surgical History  Procedure Laterality Date  . Ablation    . Wrist surgery       No family history on file.   Social History   Social History  . Marital Status: Single    Spouse Name: N/A  . Number of Children: N/A  . Years of Education: N/A   Occupational History  . Not on file.   Social History Main Topics  . Smoking status: Current Every Day Smoker  . Smokeless tobacco: Not on file  . Alcohol Use: Yes  . Drug Use: No  . Sexual Activity: Not on file   Other Topics Concern  . Not on file   Social History Narrative     BP 130/82 mmHg  Pulse 150  Ht  (1.981 m)  Wt 376 lb (170.552 kg)  BMI 43.46 kg/m2  Physical Exam:  obese appearing 44 yo man,  NAD HEENT: Unremarkable Neck:  7 cm JVD, no thyromegally Lymphatics:  No adenopathy Back:  No CVA tenderness Lungs:  Clear with no wheezes HEART:  IRIR rate and rhythm, no murmurs, no rubs, no clicks Abd:  soft, positive bowel sounds, no organomegally, no rebound, no guarding Ext:  2 plus pulses, no edema, no cyanosis, no clubbing Skin:  No rashes no nodules Neuro:  CN II through XII intact, motor grossly intact  EKG - atrial fib with a RVR, minimal pre-excitation  Assess/Plan:

## 2015-07-18 NOTE — Patient Instructions (Signed)
Medication Instructions: 1) Stop Aspirin 2) Stop Multaq 3) Start Xarelto 20 mg one tablet by mouth once daily in the evening 4) Start Amiodarone 200 mg two tablets (400 mg) twice daily x 2 weeks, then decrease to one tablet (200 mg) by mouth twice daily  Labwork: - Your physician recommends that you return for lab work today: HgBA1C, BMP/ TSH/ Liver  Procedures/Testing: - none  Follow-Up: - Your physician recommends that you schedule a follow-up appointment: the first week of January with Dr. Ladona Ridgelaylor  Any Additional Special Instructions Will Be Listed Below (If Applicable). - none

## 2015-07-18 NOTE — Assessment & Plan Note (Signed)
We have started to discuss weight loss. He needs to lose 100 lbs.

## 2015-07-18 NOTE — Assessment & Plan Note (Signed)
I suspect his pre-excitation will go away with amiodarone.

## 2015-07-18 NOTE — Addendum Note (Signed)
Addended bySherri Rad: Klaryssa Fauth C on: 07/18/2015 10:11 AM   Modules accepted: Orders

## 2015-07-18 NOTE — Assessment & Plan Note (Signed)
His blood pressure is controlled. Will follow.  

## 2015-07-23 ENCOUNTER — Telehealth: Payer: Self-pay | Admitting: Internal Medicine

## 2015-07-23 NOTE — Telephone Encounter (Signed)
[name] authorized us to talk with his girlfriend, Dean [name].  She states since starting the Amiodarone he has been very weak.  States he wants to sit in chair all the time.  This AM HR was 90 per pulse Ox.  Has not taken BP. Advised that the medication could make him feel weak initially. Advised to try to reassure him and to try to get him to do some walking around house.  She states she will try to encourage to be more active.  Also advised would route message to Anselm PancoastKelly Lanier,RN, Dr. Lubertha Basqueaylor's nurse for any recommendations.  He is to be on Amiodarone 400 mg BID starting 12/1 for 2 wks then 200 mg BID.

## 2015-07-23 NOTE — Telephone Encounter (Signed)
Girlfriend calling. We do not have a DPR stating we can speak with her. He is not currently with her.  Advised her to have him call us.  She states she will.

## 2015-07-23 NOTE — Telephone Encounter (Signed)
New message     Pt is complaining of weakness and body is "sore".  He is on amiodarone and xarelto----according to girlfriend.  Could these medications be making his feel weak?

## 2015-08-15 ENCOUNTER — Telehealth: Payer: Self-pay | Admitting: Cardiology

## 2015-08-15 NOTE — Telephone Encounter (Signed)
Contacted by [facility], patient in ER with abdominal pain. Cardiology contacted for aflutter with rates of 120s. Patient is hemodynamically stable, primary complaint is regarding his abdominal pain. From chart review he has history of WPW with multiple failed ablations, he has history of afib followed by Dr Ladona Ridgelaylor that has failed tikasyn, flecanide, and multaq. Recently started on amiodarone. I have recommended his workup for abdominal pain be completed in the ER (a CT scan is planned) and if admission is required an admission to medicine for his abdominal pain with cardiology following as consult. Our likely management would be IV loading of amiodarone for his aflutter. If [facility] admitted to [facility] may contact us overnight for consultation.    Dominga FerryJ Charlot Gouin MD

## 2015-08-16 DIAGNOSIS — R109 Unspecified abdominal pain: Secondary | ICD-10-CM | POA: Insufficient documentation

## 2015-08-16 DIAGNOSIS — D751 Secondary polycythemia: Secondary | ICD-10-CM | POA: Insufficient documentation

## 2015-08-16 DIAGNOSIS — J9600 Acute respiratory failure, unspecified whether with hypoxia or hypercapnia: Secondary | ICD-10-CM | POA: Insufficient documentation

## 2015-08-16 DIAGNOSIS — R1013 Epigastric pain: Secondary | ICD-10-CM | POA: Insufficient documentation

## 2015-08-16 DIAGNOSIS — Z7901 Long term (current) use of anticoagulants: Secondary | ICD-10-CM | POA: Insufficient documentation

## 2015-08-16 DIAGNOSIS — K2970 Gastritis, unspecified, without bleeding: Secondary | ICD-10-CM | POA: Insufficient documentation

## 2015-08-16 DIAGNOSIS — Z9889 Other specified postprocedural states: Secondary | ICD-10-CM | POA: Insufficient documentation

## 2015-08-16 DIAGNOSIS — Z8679 Personal history of other diseases of the circulatory system: Secondary | ICD-10-CM | POA: Insufficient documentation

## 2015-08-22 ENCOUNTER — Encounter: Payer: Self-pay | Admitting: Internal Medicine

## 2015-08-22 ENCOUNTER — Ambulatory Visit (INDEPENDENT_AMBULATORY_CARE_PROVIDER_SITE_OTHER): Payer: Medicare Other | Admitting: Internal Medicine

## 2015-08-22 VITALS — HR 78 | Ht 78.0 in | Wt 369.0 lb

## 2015-08-22 DIAGNOSIS — I480 Paroxysmal atrial fibrillation: Secondary | ICD-10-CM

## 2015-08-22 DIAGNOSIS — I10 Essential (primary) hypertension: Secondary | ICD-10-CM

## 2015-08-22 DIAGNOSIS — I456 Pre-excitation syndrome: Secondary | ICD-10-CM | POA: Diagnosis not present

## 2015-08-22 NOTE — Assessment & Plan Note (Signed)
He has minimal pre-excitation on his ECG. Will follow.

## 2015-08-22 NOTE — Progress Notes (Signed)
HPI Dean [name] returns today for followup. He is a pleasant 45 yo morbidly obese man with a long h/o tachypalpitations and WPW syndrome. He has undergone multiple catheter ablations but has persistent WPW and has developed worsening atrial fib with a RVR. He does not feel palpitations. He has had progressive PND and orthopnea.  He denies syncope or chest pain. Minimal peripheral edema. 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 When I saw him last several weeks ago, he was in atrial fib with an RVR and his heart failure symptoms had worsened. I started amiodarone. He developed some gastritis and has been placed on protonix. He is now seeing someone for his diabetes and his sugars are better. He has lost 10 lbs.  Allergies  Allergen Reactions  . Prednisone Other (See Comments)    "goes crazy"     Current Outpatient Prescriptions  Medication Sig Dispense Refill  . alprazolam (XANAX) 2 MG tablet Take 2 mg by mouth 2 (two) times daily.     Marland Kitchen. amiodarone (PACERONE) 200 MG tablet Take 1 tablet by mouth 2 (two) times daily.    . budesonide-formoterol (SYMBICORT) 160-4.5 MCG/ACT inhaler Inhale 2 puffs into the lungs daily as needed (shortness of breath).     . cetirizine (ZYRTEC) 10 MG tablet Take 10 mg by mouth daily.    Marland Kitchen. diltiazem (CARDIZEM CD) 180 MG 24 hr capsule Take 180 mg by mouth daily.     Marland Kitchen. HYDROcodone-acetaminophen (NORCO/VICODIN) 5-325 MG tablet Take 1-2 tablets by mouth every 6 (six) hours as needed.    Marland Kitchen. ipratropium (ATROVENT) 0.02 % nebulizer solution Inhale 250 mcg into the lungs every 4 (four) hours as needed. As needed for shortness of breath    . levalbuterol (XOPENEX HFA) 45 MCG/ACT inhaler Inhale 1-2 puffs into the lungs every 6 (six) hours as needed for wheezing or shortness of breath.    . metFORMIN (GLUCOPHAGE) 1000 MG tablet Take 1 tablet by mouth 2 (two) times daily.    . montelukast (SINGULAIR) 10 MG tablet Take 10 mg by mouth at bedtime.    .  multivitamin-iron-minerals-folic acid (CENTRUM) chewable tablet Chew 1 tablet by mouth daily.    . pantoprazole (PROTONIX) 40 MG tablet Take 40 mg by mouth.    . rivaroxaban (XARELTO) 20 MG TABS tablet Take 1 tablet (20 mg total) by mouth daily with supper.     No current facility-administered medications for this visit.     Past Medical History  Diagnosis Date  . Asthma   . SVT (supraventricular tachycardia) (HCC)   . WPW (Wolff-Parkinson-White syndrome)   . Diabetes mellitus, type II (HCC)   . Atrial arrhythmia     multiples with 5-6 ablations  . Chest tightness 05/08/14  . Palpitations 05/08/14  . Atrial fibrillation with rapid ventricular response (HCC)     With rates up to 200 as well as short PR tachycardia, cycle length of 240 ms.  . Anxiety   . SOB (shortness of breath) 05/08/14  . Snores     Wil occasional apnea. Had a remote sleep study but does not remember results.  . Morbid obesity (HCC)     ROS:   All systems reviewed and negative except as noted in the HPI.   Past Surgical History  Procedure Laterality Date  . Ablation    . Wrist surgery       No family history on file.   Social History   Social History  .  Marital Status: Single    Spouse Name: N/A  . Number of Children: N/A  . Years of Education: N/A   Occupational History  . Not on file.   Social History Main Topics  . Smoking status: Current Every Day Smoker  . Smokeless tobacco: Not on file  . Alcohol Use: Yes  . Drug Use: No  . Sexual Activity: Not on file   Other Topics Concern  . Not on file   Social History Narrative     Ht 6\' 6"  (1.981 m)  Wt 369 lb (167.377 kg)  BMI 42.65 kg/m2  Physical Exam:  obese appearing 45 yo man, NAD HEENT: Unremarkable Neck:  7 cm JVD, no thyromegally Lymphatics:  No adenopathy Back:  No CVA tenderness Lungs:  Clear with no wheezes HEART:  IRIR rate and rhythm, no murmurs, no rubs, no clicks Abd:  soft, positive bowel sounds, no  organomegally, no rebound, no guarding Ext:  2 plus pulses, no edema, no cyanosis, no clubbing Skin:  No rashes no nodules Neuro:  CN II through XII intact, motor grossly intact  EKG - NSR with minimal pre-excitation  Assess/Plan:

## 2015-08-22 NOTE — Assessment & Plan Note (Signed)
His blood pressure was 142/84 by me today. Will follow. He is encouraged to lose weight.

## 2015-08-22 NOTE — Assessment & Plan Note (Signed)
We discussed portion control and the importance of weight loss. He will try and lose weight.

## 2015-08-22 NOTE — Patient Instructions (Signed)
Medication Instructions:  Your physician recommends that you continue on your current medications as directed. Please refer to the Current Medication list given to you today.   Labwork: None ordered   Testing/Procedures: Your physician has requested that you have an echocardiogram. Echocardiography is a painless test that uses sound waves to create images of your heart. It provides your doctor with information about the size and shape of your heart and how well your heart's chambers and valves are working. This procedure takes approximately one hour. There are no restrictions for this procedure.    Follow-Up: Your physician recommends that you schedule a follow-up appointment in: 3 months with Dr Sinda Duaylor---same day as echo   Any Other Special Instructions Will Be Listed Below (If Applicable).     If you need a refill on your cardiac medications before your next appointment, please call your pharmacy.

## 2015-08-22 NOTE — Assessment & Plan Note (Signed)
He is maintaining NSR. He will continue his amiodarone. I plan to reduce his dose in 3 months if he is maintaining NSR.

## 2015-08-23 NOTE — Addendum Note (Signed)
Addended by: Reesa ChewJONES, Demond Shallenberger G on: 08/23/2015 04:48 PM   Modules accepted: Orders

## 2015-09-10 ENCOUNTER — Other Ambulatory Visit: Payer: Self-pay | Admitting: Internal Medicine

## 2015-11-05 ENCOUNTER — Other Ambulatory Visit: Payer: Self-pay | Admitting: Internal Medicine

## 2015-11-19 ENCOUNTER — Ambulatory Visit: Payer: Medicare Other | Admitting: Internal Medicine

## 2015-11-19 ENCOUNTER — Other Ambulatory Visit (HOSPITAL_COMMUNITY): Payer: Medicare Other

## 2015-12-24 ENCOUNTER — Ambulatory Visit: Payer: Medicare Other | Admitting: Internal Medicine

## 2015-12-24 ENCOUNTER — Other Ambulatory Visit (HOSPITAL_COMMUNITY): Payer: Medicare Other

## 2015-12-25 ENCOUNTER — Encounter: Payer: Self-pay | Admitting: Internal Medicine

## 2016-01-10 ENCOUNTER — Encounter: Payer: Self-pay | Admitting: Internal Medicine

## 2016-01-10 ENCOUNTER — Telehealth: Payer: Self-pay | Admitting: Internal Medicine

## 2016-01-10 NOTE — Telephone Encounter (Signed)
Error attempting to send letter

## 2016-03-02 ENCOUNTER — Encounter (INDEPENDENT_AMBULATORY_CARE_PROVIDER_SITE_OTHER): Payer: Self-pay

## 2016-03-02 ENCOUNTER — Other Ambulatory Visit: Payer: Self-pay

## 2016-03-02 ENCOUNTER — Ambulatory Visit (HOSPITAL_COMMUNITY): Payer: Medicare Other | Attending: Cardiology

## 2016-03-02 ENCOUNTER — Ambulatory Visit (INDEPENDENT_AMBULATORY_CARE_PROVIDER_SITE_OTHER): Payer: Medicare Other | Admitting: Internal Medicine

## 2016-03-02 ENCOUNTER — Encounter: Payer: Self-pay | Admitting: Internal Medicine

## 2016-03-02 VITALS — BP 154/86 | HR 81 | Ht 78.75 in | Wt 371.0 lb

## 2016-03-02 DIAGNOSIS — I059 Rheumatic mitral valve disease, unspecified: Secondary | ICD-10-CM | POA: Insufficient documentation

## 2016-03-02 DIAGNOSIS — Z6841 Body Mass Index (BMI) 40.0 and over, adult: Secondary | ICD-10-CM | POA: Insufficient documentation

## 2016-03-02 DIAGNOSIS — Z720 Tobacco use: Secondary | ICD-10-CM | POA: Diagnosis not present

## 2016-03-02 DIAGNOSIS — E119 Type 2 diabetes mellitus without complications: Secondary | ICD-10-CM | POA: Insufficient documentation

## 2016-03-02 DIAGNOSIS — I4891 Unspecified atrial fibrillation: Secondary | ICD-10-CM | POA: Insufficient documentation

## 2016-03-02 DIAGNOSIS — I456 Pre-excitation syndrome: Secondary | ICD-10-CM | POA: Diagnosis not present

## 2016-03-02 DIAGNOSIS — I480 Paroxysmal atrial fibrillation: Secondary | ICD-10-CM

## 2016-03-02 DIAGNOSIS — I119 Hypertensive heart disease without heart failure: Secondary | ICD-10-CM | POA: Diagnosis not present

## 2016-03-02 DIAGNOSIS — Z79899 Other long term (current) drug therapy: Secondary | ICD-10-CM | POA: Diagnosis not present

## 2016-03-02 LAB — ECHOCARDIOGRAM COMPLETE
AOASC: 32 cm
CHL CUP MV DEC (S): 204
EERAT: 6.6
EWDT: 204 ms
FS: 36 % (ref 28–44)
IVS/LV PW RATIO, ED: 1.11
LA diam end sys: 53 mm
LA diam index: 1.71 cm/m2
LA vol index: 20.3 mL/m2
LA vol: 63 mL
LASIZE: 53 mm
LAVOLA4C: 56 mL
LV PW d: 12.3 mm — AB (ref 0.6–1.1)
LV SIMPSON'S DISK: 45
LV TDI E'MEDIAL: 9.47
LV dias vol index: 33 mL/m2
LV e' LATERAL: 12.4 cm/s
LVDIAVOL: 102 mL (ref 62–150)
LVEEAVG: 6.6
LVEEMED: 6.6
LVOT VTI: 18.3 cm
LVOT area: 4.15 cm2
LVOTD: 23 mm
LVOTPV: 104 cm/s
LVOTSV: 76 mL
LVSYSVOL: 56 mL (ref 21–61)
LVSYSVOLIN: 18 mL/m2
MV Peak grad: 3 mmHg
MV pk A vel: 64.4 m/s
MVPKEVEL: 81.8 m/s
RV LATERAL S' VELOCITY: 14.1 cm/s
Stroke v: 46 ml
TDI e' lateral: 12.4

## 2016-03-02 LAB — HEPATIC FUNCTION PANEL
ALK PHOS: 140 U/L — AB (ref 40–115)
ALT: 59 U/L — AB (ref 9–46)
AST: 32 U/L (ref 10–40)
Albumin: 4.1 g/dL (ref 3.6–5.1)
BILIRUBIN DIRECT: 0.1 mg/dL (ref <=0.2)
BILIRUBIN INDIRECT: 0.2 mg/dL (ref 0.2–1.2)
TOTAL PROTEIN: 6.9 g/dL (ref 6.1–8.1)
Total Bilirubin: 0.3 mg/dL (ref 0.2–1.2)

## 2016-03-02 LAB — TSH: TSH: 2.74 mIU/L (ref 0.40–4.50)

## 2016-03-02 MED ORDER — AMIODARONE HCL 200 MG PO TABS: 200.0000 mg | ORAL_TABLET | Freq: Every day | ORAL | Status: DC

## 2016-03-02 NOTE — Progress Notes (Signed)
HPI Mr. Phineas RealMabe returns today for followup. He is a pleasant 45 yo morbidly obese man with a long h/o tachypalpitations and WPW syndrome. He has undergone multiple catheter ablations but has persistent WPW and has developed worsening atrial fib with a RVR. Since being placed on amio, he has improved. No heart racing and no sob. He is outside all day and is quite sun exposed due to the amio.   Allergies  Allergen Reactions  . Prednisone Other (See Comments)    "goes crazy"     Current Outpatient Prescriptions  Medication Sig Dispense Refill  . alprazolam (XANAX) 2 MG tablet Take 2 mg by mouth 2 (two) times daily.     Marland Kitchen. amiodarone (PACERONE) 200 MG tablet Take 1 tablet (200 mg total) by mouth 2 (two) times daily. 60 tablet 9  . budesonide-formoterol (SYMBICORT) 160-4.5 MCG/ACT inhaler Inhale 2 puffs into the lungs daily as needed (shortness of breath).     . cetirizine (ZYRTEC) 10 MG tablet Take 10 mg by mouth daily.    Marland Kitchen. diltiazem (CARDIZEM CD) 180 MG 24 hr capsule Take 180 mg by mouth daily.     Marland Kitchen. HYDROcodone-acetaminophen (NORCO/VICODIN) 5-325 MG tablet Take 1-2 tablets by mouth every 6 (six) hours as needed (pain).     Marland Kitchen. ipratropium (ATROVENT) 0.02 % nebulizer solution Take 250 mcg by nebulization every 4 (four) hours as needed. As needed for shortness of breath    . levalbuterol (XOPENEX HFA) 45 MCG/ACT inhaler Inhale 1-2 puffs into the lungs every 6 (six) hours as needed for wheezing or shortness of breath.    . metFORMIN (GLUCOPHAGE) 1000 MG tablet Take 1 tablet by mouth 2 (two) times daily.    . montelukast (SINGULAIR) 10 MG tablet Take 10 mg by mouth at bedtime.    . multivitamin-iron-minerals-folic acid (CENTRUM) chewable tablet Chew 1 tablet by mouth daily.    . pantoprazole (PROTONIX) 40 MG tablet Take 40 mg by mouth daily.     . rivaroxaban (XARELTO) 20 MG TABS tablet Take 1 tablet (20 mg total) by mouth daily with supper.     No current facility-administered medications  for this visit.     Past Medical History  Diagnosis Date  . Asthma   . SVT (supraventricular tachycardia) (HCC)   . WPW (Wolff-Parkinson-White syndrome)   . Diabetes mellitus, type II (HCC)   . Atrial arrhythmia     multiples with 5-6 ablations  . Chest tightness 05/08/14  . Palpitations 05/08/14  . Atrial fibrillation with rapid ventricular response (HCC)     With rates up to 200 as well as short PR tachycardia, cycle length of 240 ms.  . Anxiety   . SOB (shortness of breath) 05/08/14  . Snores     Wil occasional apnea. Had a remote sleep study but does not remember results.  . Morbid obesity (HCC)     ROS:   All systems reviewed and negative except as noted in the HPI.   Past Surgical History  Procedure Laterality Date  . Ablation    . Wrist surgery       No family history on file.   Social History   Social History  . Marital Status: Single    Spouse Name: N/A  . Number of Children: N/A  . Years of Education: N/A   Occupational History  . Not on file.   Social History Main Topics  . Smoking status: Current Every Day Smoker  . Smokeless tobacco:  Not on file  . Alcohol Use: Yes  . Drug Use: No  . Sexual Activity: Not on file   Other Topics Concern  . Not on file   Social History Narrative     BP 154/86 mmHg  Pulse 81  Ht 6' 6.75" (2 m)  Wt 371 lb (168.284 kg)  BMI 42.07 kg/m2  Physical Exam:  obese appearing 45 yo man, NAD HEENT: Unremarkable Neck:  7 cm JVD, no thyromegally Lymphatics:  No adenopathy Back:  No CVA tenderness Lungs:  Clear with no wheezes HEART:  reg rate and rhythm, no murmurs, no rubs, no clicks Abd:  soft, positive bowel sounds, no organomegally, no rebound, no guarding Ext:  2 plus pulses, no edema, no cyanosis, no clubbing Skin:  No rashes no nodules Neuro:  CN II through XII intact, motor grossly intact  EKG - NSR with no pre-excitation  Assess/Plan: 1. WPW - he is without pre-excitation on his ECG today. Will  reduce his dose of amio to 200 mg daily. Will check TSH and liver panel. 2. PAF - he is maintaining NSR. Continue current meds. 3. Coags - he is tolerating Xarelto 4. HTN - his blood pressure remains elevated. He is encouraged to lose weight  Leonia Reeves.D.

## 2016-03-02 NOTE — Patient Instructions (Signed)
Medication Instructions:  Your physician has recommended you make the following change in your medication:  1) DECREASE Amiodarone to 200 mg once daily  Labwork: Today: TSH and LFT  Testing/Procedures: None ordered  Follow-Up: Your physician wants you to follow-up in: 6 months with Dr. Ladona Ridgelaylor. You will receive a reminder letter in the mail two months in advance. If you don't receive a letter, please call our office to schedule the follow-up appointment.  If you need a refill on your cardiac medications before your next appointment, please call your pharmacy.  Thank you for choosing [facility]!!

## 2016-05-17 ENCOUNTER — Emergency Department (HOSPITAL_COMMUNITY): Payer: Medicare Other

## 2016-05-17 ENCOUNTER — Inpatient Hospital Stay (HOSPITAL_COMMUNITY)
Admission: EM | Admit: 2016-05-17 | Discharge: 2016-05-19 | DRG: 065 | Disposition: A | Discharge status: Home / Self Care | Payer: Medicare Other | Attending: Internal Medicine | Admitting: Internal Medicine

## 2016-05-17 DIAGNOSIS — F17200 Nicotine dependence, unspecified, uncomplicated: Secondary | ICD-10-CM | POA: Diagnosis present

## 2016-05-17 DIAGNOSIS — I10 Essential (primary) hypertension: Secondary | ICD-10-CM | POA: Diagnosis present

## 2016-05-17 DIAGNOSIS — I639 Cerebral infarction, unspecified: Secondary | ICD-10-CM | POA: Diagnosis not present

## 2016-05-17 DIAGNOSIS — D72829 Elevated white blood cell count, unspecified: Secondary | ICD-10-CM | POA: Diagnosis present

## 2016-05-17 DIAGNOSIS — G8194 Hemiplegia, unspecified affecting left nondominant side: Secondary | ICD-10-CM | POA: Diagnosis present

## 2016-05-17 DIAGNOSIS — Y907 Blood alcohol level of 200-239 mg/100 ml: Secondary | ICD-10-CM | POA: Diagnosis present

## 2016-05-17 DIAGNOSIS — I2510 Atherosclerotic heart disease of native coronary artery without angina pectoris: Secondary | ICD-10-CM | POA: Diagnosis present

## 2016-05-17 DIAGNOSIS — I456 Pre-excitation syndrome: Secondary | ICD-10-CM | POA: Diagnosis present

## 2016-05-17 DIAGNOSIS — J449 Chronic obstructive pulmonary disease, unspecified: Secondary | ICD-10-CM | POA: Diagnosis present

## 2016-05-17 DIAGNOSIS — E782 Mixed hyperlipidemia: Secondary | ICD-10-CM | POA: Diagnosis present

## 2016-05-17 DIAGNOSIS — Z7984 Long term (current) use of oral hypoglycemic drugs: Secondary | ICD-10-CM

## 2016-05-17 DIAGNOSIS — Z79899 Other long term (current) drug therapy: Secondary | ICD-10-CM

## 2016-05-17 DIAGNOSIS — I480 Paroxysmal atrial fibrillation: Secondary | ICD-10-CM | POA: Diagnosis present

## 2016-05-17 DIAGNOSIS — F1010 Alcohol abuse, uncomplicated: Secondary | ICD-10-CM | POA: Diagnosis present

## 2016-05-17 DIAGNOSIS — I63311 Cerebral infarction due to thrombosis of right middle cerebral artery: Secondary | ICD-10-CM

## 2016-05-17 DIAGNOSIS — Z6841 Body Mass Index (BMI) 40.0 and over, adult: Secondary | ICD-10-CM

## 2016-05-17 DIAGNOSIS — G4733 Obstructive sleep apnea (adult) (pediatric): Secondary | ICD-10-CM | POA: Diagnosis present

## 2016-05-17 DIAGNOSIS — E1165 Type 2 diabetes mellitus with hyperglycemia: Secondary | ICD-10-CM | POA: Diagnosis present

## 2016-05-17 DIAGNOSIS — R4781 Slurred speech: Secondary | ICD-10-CM | POA: Diagnosis present

## 2016-05-17 DIAGNOSIS — F10129 Alcohol abuse with intoxication, unspecified: Secondary | ICD-10-CM | POA: Diagnosis present

## 2016-05-17 DIAGNOSIS — R29810 Facial weakness: Secondary | ICD-10-CM | POA: Diagnosis present

## 2016-05-17 DIAGNOSIS — G459 Transient cerebral ischemic attack, unspecified: Secondary | ICD-10-CM

## 2016-05-17 DIAGNOSIS — Z8679 Personal history of other diseases of the circulatory system: Secondary | ICD-10-CM

## 2016-05-17 DIAGNOSIS — D751 Secondary polycythemia: Secondary | ICD-10-CM | POA: Diagnosis present

## 2016-05-17 DIAGNOSIS — E119 Type 2 diabetes mellitus without complications: Secondary | ICD-10-CM

## 2016-05-17 DIAGNOSIS — Z8249 Family history of ischemic heart disease and other diseases of the circulatory system: Secondary | ICD-10-CM

## 2016-05-17 DIAGNOSIS — R2990 Unspecified symptoms and signs involving the nervous system: Secondary | ICD-10-CM | POA: Diagnosis present

## 2016-05-17 DIAGNOSIS — K219 Gastro-esophageal reflux disease without esophagitis: Secondary | ICD-10-CM | POA: Diagnosis present

## 2016-05-17 DIAGNOSIS — R911 Solitary pulmonary nodule: Secondary | ICD-10-CM | POA: Diagnosis present

## 2016-05-17 DIAGNOSIS — F419 Anxiety disorder, unspecified: Secondary | ICD-10-CM | POA: Diagnosis present

## 2016-05-17 DIAGNOSIS — R29898 Other symptoms and signs involving the musculoskeletal system: Secondary | ICD-10-CM

## 2016-05-17 DIAGNOSIS — R531 Weakness: Secondary | ICD-10-CM | POA: Diagnosis not present

## 2016-05-17 DIAGNOSIS — Z7901 Long term (current) use of anticoagulants: Secondary | ICD-10-CM

## 2016-05-17 HISTORY — DX: Pneumonia, unspecified organism: J18.9

## 2016-05-17 HISTORY — DX: Gastro-esophageal reflux disease without esophagitis: K21.9

## 2016-05-17 LAB — I-STAT CHEM 8, ED
BUN: 8 mg/dL (ref 6–20)
CALCIUM ION: 0.99 mmol/L — AB (ref 1.15–1.40)
Chloride: 95 mmol/L — ABNORMAL LOW (ref 101–111)
Creatinine, Ser: 1.2 mg/dL (ref 0.61–1.24)
Glucose, Bld: 260 mg/dL — ABNORMAL HIGH (ref 65–99)
HEMATOCRIT: 53 % — AB (ref 39.0–52.0)
HEMOGLOBIN: 18 g/dL — AB (ref 13.0–17.0)
Potassium: 3.9 mmol/L (ref 3.5–5.1)
SODIUM: 134 mmol/L — AB (ref 135–145)
TCO2: 25 mmol/L (ref 0–100)

## 2016-05-17 LAB — I-STAT TROPONIN, ED: TROPONIN I, POC: 0.01 ng/mL (ref 0.00–0.08)

## 2016-05-17 LAB — CBG MONITORING, ED: GLUCOSE-CAPILLARY: 272 mg/dL — AB (ref 65–99)

## 2016-05-17 MED ORDER — IOPAMIDOL (ISOVUE-370) INJECTION 76%
INTRAVENOUS | Status: AC
  Filled 2016-05-17: qty 100

## 2016-05-17 NOTE — ED Provider Notes (Signed)
[facility]-EMERGENCY DEPT Provider Note   CSN: [id number] Arrival date & time: 05/17/16  2333 By signing my name below, I, Levon Hedger, attest that this documentation has been prepared under the direction and in the presence of Tilden Fossa, MD . Electronically Signed: Levon Hedger, Scribe. 05/17/2016. 11:45 PM.   History   Chief Complaint Chief Complaint  Patient presents with  . Code Stroke   Level 5 caveat due to acuity of condition  HPI Dean [name] is a 45 y.o. male with hx of DM, A-fib, and HTN who presents to the Emergency Department complaining of sudden onset left-sided numbness. Per ems, he told his wife he wasn't feeling well, stood up to go to the restroom, and had syncopal episode, falling face first and hitting his head. Pt states he has had an exsisting headache for 3 or 4 days.  Pt also complains of dull, worsening chest pain onset 3 days ago. He rates his pain as an 8/10. Pt endorses alcohol use today, but is unsure of how much. He is taking Xarelto. BP on arrival was 163/80.  The history is provided by the patient and the EMS personnel. The history is limited by the condition of the patient. No language interpreter was used.   Past Medical History:  Diagnosis Date  . Anxiety   . Asthma   . Atrial arrhythmia    multiples with 5-6 ablations  . Atrial fibrillation with rapid ventricular response (HCC)    With rates up to 200 as well as short PR tachycardia, cycle length of 240 ms.  . Chest tightness 05/08/14  . Diabetes mellitus, type II (HCC)   . Morbid obesity (HCC)   . Palpitations 05/08/14  . Snores    Wil occasional apnea. Had a remote sleep study but does not remember results.  . SOB (shortness of breath) 05/08/14  . SVT (supraventricular tachycardia) (HCC)   . WPW (Wolff-Parkinson-White syndrome)     Patient Active Problem List   Diagnosis Date Noted  . Abdominal pain, epigastric 08/16/2015  . Abdominal pain 08/16/2015  . Acute respiratory failure (HCC)  08/16/2015  . Atrial fibrillation and flutter (HCC) 08/16/2015  . Long term current use of anticoagulant 08/16/2015  . Diabetes mellitus type 2 in obese (HCC) 08/16/2015  . Gastric catarrh 08/16/2015  . History of biliary T-tube placement 08/16/2015  . Personal history of other diseases of the circulatory system 08/16/2015  . Polycythemia 08/16/2015  . Atrial fibrillation (HCC) 07/18/2015  . Morbid obesity (HCC) 07/18/2015  . Essential hypertension 07/18/2015  . Type II or unspecified type diabetes mellitus without mention of complication, not stated as uncontrolled 05/10/2014  . SVT (supraventricular tachycardia) (HCC) 05/09/2014  . WPW (Wolff-Parkinson-White syndrome) 05/09/2014  . Avascular necrosis of bone of hip (HCC) 07/12/2013  . Acid reflux 07/12/2013  . Arthralgia of hip 07/12/2013  . Chest pain 05/01/2013   Past Surgical History:  Procedure Laterality Date  . ABLATION    . WRIST SURGERY      Home Medications    Prior to Admission medications   Medication Sig Start Date End Date Taking? Authorizing Provider  alprazolam Prudy Feeler) 2 MG tablet Take 2 mg by mouth 2 (two) times daily.     Historical Provider, MD  amiodarone (PACERONE) 200 MG tablet Take 1 tablet (200 mg total) by mouth daily. 03/02/16   Marinus Maw, MD  budesonide-formoterol [facility]) 160-4.5 MCG/ACT inhaler Inhale 2 puffs into the lungs daily as needed (shortness of breath).  Historical Provider, MD  cetirizine (ZYRTEC) 10 MG tablet Take 10 mg by mouth daily.    Historical Provider, MD  diltiazem (CARDIZEM CD) 180 MG 24 hr capsule Take 180 mg by mouth daily.     Historical Provider, MD  HYDROcodone-acetaminophen (NORCO/VICODIN) 5-325 MG tablet Take 1-2 tablets by mouth every 6 (six) hours as needed (pain).  08/16/15   Historical Provider, MD  ipratropium (ATROVENT) 0.02 % nebulizer solution Take 250 mcg by nebulization every 4 (four) hours as needed. As needed for shortness of breath    Historical  Provider, MD  levalbuterol (XOPENEX HFA) 45 MCG/ACT inhaler Inhale 1-2 puffs into the lungs every 6 (six) hours as needed for wheezing or shortness of breath.    Historical Provider, MD  metFORMIN (GLUCOPHAGE) 1000 MG tablet Take 1 tablet by mouth 2 (two) times daily. 08/17/15   Historical Provider, MD  montelukast (SINGULAIR) 10 MG tablet Take 10 mg by mouth at bedtime.    Historical Provider, MD  multivitamin-iron-minerals-folic acid (CENTRUM) chewable tablet Chew 1 tablet by mouth daily.    Historical Provider, MD  pantoprazole (PROTONIX) 40 MG tablet Take 40 mg by mouth daily.  08/16/15 08/15/16  Historical Provider, MD  rivaroxaban (XARELTO) 20 MG TABS tablet Take 1 tablet (20 mg total) by mouth daily with supper. 07/18/15   Marinus MawGregg W Taylor, MD    Family History No family history on file.  Social History Social History  Substance Use Topics  . Smoking status: Current Every Day Smoker  . Smokeless tobacco: Not on file  . Alcohol use Yes   Allergies   Prednisone  Review of Systems Review of Systems  Unable to perform ROS: Acuity of condition   Physical Exam Updated Vital Signs Ht 6\' 7"  (2.007 m)   Wt (!) 375 lb (170.1 kg)   BMI 42.25 kg/m   Physical Exam  Constitutional: He is oriented to person, place, and time. He appears well-developed and well-nourished.  HENT:  Head: Normocephalic and atraumatic.  Cardiovascular: Normal rate and regular rhythm.   No murmur heard. Pulmonary/Chest: Effort normal. No respiratory distress.  Occasional and expiratory wheezes  Abdominal: Soft. There is no tenderness. There is no rebound and no guarding.  Musculoskeletal: He exhibits no edema or tenderness.  Neurological: He is alert and oriented to person, place, and time.  Left sided weakness. Confusion. Intermittently follows commands.  Skin: Skin is warm and dry.  Erythroderma  Psychiatric: He has a normal mood and affect. His behavior is normal.  Nursing note and vitals  reviewed.   ED Treatments / Results  DIAGNOSTIC STUDIES:    COORDINATION OF CARE:  11:38 PM Discussed treatment plan with pt at bedside and pt agreed to plan.  Labs (all labs ordered are listed, but only abnormal results are displayed) Labs Reviewed - No data to display  EKG  EKG Interpretation None       Radiology No results found.  Procedures Procedures (including critical care time)  Medications Ordered in ED Medications - No data to display   Initial Impression / Assessment and Plan / ED Course  I have reviewed the triage vital signs and the nursing notes.  Pertinent labs & imaging results that were available during my care of the patient were reviewed by me and considered in my medical decision making (see chart for details).  Clinical Course    Patient presented as a code stroke for left-sided deficits. He has a history of atrial fibrillation and takes around toe.  Given his history of syncope, chest pain, headache CTA chest was obtained to rule out dissection. CT head negative for acute stroke and CTA is negative for dissection. Plan to admit to the hospitalist service for further workup and management.   Final Clinical Impressions(s) / ED Diagnoses   Final diagnoses:  TIA (transient ischemic attack)  TIA (transient ischemic attack)    New Prescriptions New Prescriptions   No medications on file  I personally performed the services described in this documentation, which was scribed in my presence. The recorded information has been reviewed and is accurate.    Tilden Fossa, MD 05/18/16 [phone]

## 2016-05-18 ENCOUNTER — Observation Stay (HOSPITAL_COMMUNITY): Payer: Medicare Other

## 2016-05-18 ENCOUNTER — Other Ambulatory Visit (HOSPITAL_COMMUNITY): Payer: Medicare Other

## 2016-05-18 ENCOUNTER — Encounter (HOSPITAL_COMMUNITY): Payer: Self-pay | Admitting: *Deleted

## 2016-05-18 DIAGNOSIS — I63311 Cerebral infarction due to thrombosis of right middle cerebral artery: Secondary | ICD-10-CM | POA: Diagnosis not present

## 2016-05-18 DIAGNOSIS — G4733 Obstructive sleep apnea (adult) (pediatric): Secondary | ICD-10-CM | POA: Diagnosis present

## 2016-05-18 DIAGNOSIS — R0789 Other chest pain: Secondary | ICD-10-CM

## 2016-05-18 DIAGNOSIS — E1165 Type 2 diabetes mellitus with hyperglycemia: Secondary | ICD-10-CM | POA: Diagnosis present

## 2016-05-18 DIAGNOSIS — I2510 Atherosclerotic heart disease of native coronary artery without angina pectoris: Secondary | ICD-10-CM | POA: Diagnosis present

## 2016-05-18 DIAGNOSIS — E782 Mixed hyperlipidemia: Secondary | ICD-10-CM

## 2016-05-18 DIAGNOSIS — F419 Anxiety disorder, unspecified: Secondary | ICD-10-CM | POA: Diagnosis present

## 2016-05-18 DIAGNOSIS — F1010 Alcohol abuse, uncomplicated: Secondary | ICD-10-CM | POA: Diagnosis not present

## 2016-05-18 DIAGNOSIS — R2990 Unspecified symptoms and signs involving the nervous system: Secondary | ICD-10-CM | POA: Diagnosis not present

## 2016-05-18 DIAGNOSIS — I10 Essential (primary) hypertension: Secondary | ICD-10-CM | POA: Diagnosis present

## 2016-05-18 DIAGNOSIS — I480 Paroxysmal atrial fibrillation: Secondary | ICD-10-CM

## 2016-05-18 DIAGNOSIS — F17200 Nicotine dependence, unspecified, uncomplicated: Secondary | ICD-10-CM | POA: Diagnosis present

## 2016-05-18 DIAGNOSIS — Z8249 Family history of ischemic heart disease and other diseases of the circulatory system: Secondary | ICD-10-CM | POA: Diagnosis not present

## 2016-05-18 DIAGNOSIS — R911 Solitary pulmonary nodule: Secondary | ICD-10-CM | POA: Diagnosis present

## 2016-05-18 DIAGNOSIS — Y907 Blood alcohol level of 200-239 mg/100 ml: Secondary | ICD-10-CM | POA: Diagnosis present

## 2016-05-18 DIAGNOSIS — R4781 Slurred speech: Secondary | ICD-10-CM | POA: Diagnosis present

## 2016-05-18 DIAGNOSIS — Z6841 Body Mass Index (BMI) 40.0 and over, adult: Secondary | ICD-10-CM | POA: Diagnosis not present

## 2016-05-18 DIAGNOSIS — G8194 Hemiplegia, unspecified affecting left nondominant side: Secondary | ICD-10-CM | POA: Diagnosis present

## 2016-05-18 DIAGNOSIS — J449 Chronic obstructive pulmonary disease, unspecified: Secondary | ICD-10-CM | POA: Diagnosis present

## 2016-05-18 DIAGNOSIS — R29810 Facial weakness: Secondary | ICD-10-CM | POA: Diagnosis present

## 2016-05-18 DIAGNOSIS — Z8679 Personal history of other diseases of the circulatory system: Secondary | ICD-10-CM | POA: Diagnosis not present

## 2016-05-18 DIAGNOSIS — D72829 Elevated white blood cell count, unspecified: Secondary | ICD-10-CM | POA: Diagnosis present

## 2016-05-18 DIAGNOSIS — R531 Weakness: Secondary | ICD-10-CM | POA: Diagnosis present

## 2016-05-18 DIAGNOSIS — F10129 Alcohol abuse with intoxication, unspecified: Secondary | ICD-10-CM | POA: Diagnosis present

## 2016-05-18 DIAGNOSIS — I639 Cerebral infarction, unspecified: Secondary | ICD-10-CM | POA: Diagnosis present

## 2016-05-18 DIAGNOSIS — Z7984 Long term (current) use of oral hypoglycemic drugs: Secondary | ICD-10-CM | POA: Diagnosis not present

## 2016-05-18 DIAGNOSIS — Z7901 Long term (current) use of anticoagulants: Secondary | ICD-10-CM | POA: Diagnosis not present

## 2016-05-18 DIAGNOSIS — I6300 Cerebral infarction due to thrombosis of unspecified precerebral artery: Secondary | ICD-10-CM

## 2016-05-18 DIAGNOSIS — E119 Type 2 diabetes mellitus without complications: Secondary | ICD-10-CM

## 2016-05-18 DIAGNOSIS — K219 Gastro-esophageal reflux disease without esophagitis: Secondary | ICD-10-CM | POA: Diagnosis present

## 2016-05-18 DIAGNOSIS — D751 Secondary polycythemia: Secondary | ICD-10-CM | POA: Diagnosis present

## 2016-05-18 DIAGNOSIS — Z79899 Other long term (current) drug therapy: Secondary | ICD-10-CM | POA: Diagnosis not present

## 2016-05-18 LAB — CBC
HCT: 48 % (ref 39.0–52.0)
Hemoglobin: 16.1 g/dL (ref 13.0–17.0)
MCH: 29.9 pg (ref 26.0–34.0)
MCHC: 33.5 g/dL (ref 30.0–36.0)
MCV: 89.1 fL (ref 78.0–100.0)
PLATELETS: 248 10*3/uL (ref 150–400)
RBC: 5.39 MIL/uL (ref 4.22–5.81)
RDW: 14.1 % (ref 11.5–15.5)
WBC: 13 10*3/uL — ABNORMAL HIGH (ref 4.0–10.5)

## 2016-05-18 LAB — BASIC METABOLIC PANEL
Anion gap: 13 (ref 5–15)
BUN: 7 mg/dL (ref 6–20)
CALCIUM: 8.9 mg/dL (ref 8.9–10.3)
CO2: 24 mmol/L (ref 22–32)
CREATININE: 0.9 mg/dL (ref 0.61–1.24)
Chloride: 100 mmol/L — ABNORMAL LOW (ref 101–111)
GFR calc Af Amer: >60 mL/min (ref >60)
GFR calc non Af Amer: >60 mL/min (ref >60)
GLUCOSE: 257 mg/dL — AB (ref 65–99)
Potassium: 4.2 mmol/L (ref 3.5–5.1)
Sodium: 137 mmol/L (ref 135–145)

## 2016-05-18 LAB — URINALYSIS, ROUTINE W REFLEX MICROSCOPIC
BILIRUBIN URINE: NEGATIVE
KETONES UR: NEGATIVE mg/dL
Leukocytes, UA: NEGATIVE
NITRITE: NEGATIVE
PH: 5.5 (ref 5.0–8.0)
Protein, ur: NEGATIVE mg/dL

## 2016-05-18 LAB — RAPID URINE DRUG SCREEN, HOSP PERFORMED
AMPHETAMINES: NOT DETECTED
BENZODIAZEPINES: NOT DETECTED
Barbiturates: NOT DETECTED
COCAINE: NOT DETECTED
OPIATES: NOT DETECTED
TETRAHYDROCANNABINOL: NOT DETECTED

## 2016-05-18 LAB — APTT: aPTT: 34 seconds (ref 24–36)

## 2016-05-18 LAB — GLUCOSE, CAPILLARY
GLUCOSE-CAPILLARY: 314 mg/dL — AB (ref 65–99)
Glucose-Capillary: 277 mg/dL — ABNORMAL HIGH (ref 65–99)
Glucose-Capillary: 343 mg/dL — ABNORMAL HIGH (ref 65–99)
Glucose-Capillary: 323 mg/dL — ABNORMAL HIGH (ref 65–99)

## 2016-05-18 LAB — CBG MONITORING, ED: Glucose-Capillary: 279 mg/dL — ABNORMAL HIGH (ref 65–99)

## 2016-05-18 LAB — DIFFERENTIAL
BASOS ABS: 0 10*3/uL (ref 0.0–0.1)
BASOS PCT: 0 %
EOS ABS: 0.3 10*3/uL (ref 0.0–0.7)
EOS PCT: 2 %
LYMPHS ABS: 3.2 10*3/uL (ref 0.7–4.0)
Lymphocytes Relative: 25 %
MONOS PCT: 6 %
Monocytes Absolute: 0.8 10*3/uL (ref 0.1–1.0)
Neutro Abs: 8.6 10*3/uL — ABNORMAL HIGH (ref 1.7–7.7)
Neutrophils Relative %: 67 %

## 2016-05-18 LAB — URINE MICROSCOPIC-ADD ON

## 2016-05-18 LAB — COMPREHENSIVE METABOLIC PANEL
ALT: 70 U/L — ABNORMAL HIGH (ref 17–63)
ANION GAP: 12 (ref 5–15)
AST: 41 U/L (ref 15–41)
Albumin: 3.9 g/dL (ref 3.5–5.0)
Alkaline Phosphatase: 133 U/L — ABNORMAL HIGH (ref 38–126)
BILIRUBIN TOTAL: 0.4 mg/dL (ref 0.3–1.2)
BUN: 7 mg/dL (ref 6–20)
CHLORIDE: 96 mmol/L — AB (ref 101–111)
CO2: 25 mmol/L (ref 22–32)
Calcium: 8.9 mg/dL (ref 8.9–10.3)
Creatinine, Ser: 0.96 mg/dL (ref 0.61–1.24)
Glucose, Bld: 260 mg/dL — ABNORMAL HIGH (ref 65–99)
POTASSIUM: 3.9 mmol/L (ref 3.5–5.1)
Sodium: 133 mmol/L — ABNORMAL LOW (ref 135–145)
TOTAL PROTEIN: 7.6 g/dL (ref 6.5–8.1)

## 2016-05-18 LAB — LIPID PANEL
CHOL/HDL RATIO: 5.9 ratio
Cholesterol: 196 mg/dL (ref 0–200)
HDL: 33 mg/dL — AB (ref >40)
LDL CALC: 98 mg/dL (ref 0–99)
Triglycerides: 324 mg/dL — ABNORMAL HIGH (ref <150)
VLDL: 65 mg/dL — AB (ref 0–40)

## 2016-05-18 LAB — TROPONIN I: Troponin I: <0.03 ng/mL (ref <0.03)

## 2016-05-18 LAB — PROTIME-INR
INR: 1.09
PROTHROMBIN TIME: 14.1 s (ref 11.4–15.2)

## 2016-05-18 LAB — ETHANOL: Alcohol, Ethyl (B): 200 mg/dL — ABNORMAL HIGH (ref <5)

## 2016-05-18 MED ORDER — LORAZEPAM 1 MG PO TABS
1.0000 mg | ORAL_TABLET | Freq: Four times a day (QID) | ORAL | Status: DC | PRN
  Administered 2016-05-18: 1 mg via ORAL
  Filled 2016-05-18: qty 1

## 2016-05-18 MED ORDER — INSULIN ASPART 100 UNIT/ML ~~LOC~~ SOLN
0.0000 [IU] | SUBCUTANEOUS | Status: DC
  Administered 2016-05-18 (×2): 8 [IU] via SUBCUTANEOUS
  Administered 2016-05-18 – 2016-05-19 (×3): 11 [IU] via SUBCUTANEOUS
  Administered 2016-05-19: 8 [IU] via SUBCUTANEOUS
  Filled 2016-05-18: qty 1

## 2016-05-18 MED ORDER — ACETAMINOPHEN 650 MG RE SUPP: 650.0000 mg | RECTAL | Status: DC | PRN

## 2016-05-18 MED ORDER — LEVALBUTEROL HCL 1.25 MG/0.5ML IN NEBU
1.2500 mg | INHALATION_SOLUTION | Freq: Four times a day (QID) | RESPIRATORY_TRACT | Status: DC | PRN
  Filled 2016-05-18: qty 0.5

## 2016-05-18 MED ORDER — STROKE: EARLY STAGES OF RECOVERY BOOK
Freq: Once | Status: DC
  Filled 2016-05-18: qty 1

## 2016-05-18 MED ORDER — AMIODARONE HCL 200 MG PO TABS
200.0000 mg | ORAL_TABLET | Freq: Every day | ORAL | Status: DC
  Administered 2016-05-19: 200 mg via ORAL
  Filled 2016-05-18: qty 1

## 2016-05-18 MED ORDER — LEVALBUTEROL HCL 1.25 MG/0.5ML IN NEBU
1.2500 mg | INHALATION_SOLUTION | Freq: Once | RESPIRATORY_TRACT | Status: AC
  Administered 2016-05-18: 1.25 mg via RESPIRATORY_TRACT
  Filled 2016-05-18: qty 0.5

## 2016-05-18 MED ORDER — SODIUM CHLORIDE 0.9 % IV SOLN
INTRAVENOUS | Status: DC
  Administered 2016-05-18: 04:00:00 via INTRAVENOUS

## 2016-05-18 MED ORDER — ACETAMINOPHEN 325 MG PO TABS: 650.0000 mg | ORAL_TABLET | ORAL | Status: DC | PRN

## 2016-05-18 MED ORDER — ASPIRIN 300 MG RE SUPP: 300.0000 mg | Freq: Every day | RECTAL | Status: DC

## 2016-05-18 MED ORDER — FOLIC ACID 1 MG PO TABS
1.0000 mg | ORAL_TABLET | Freq: Every day | ORAL | Status: DC
  Administered 2016-05-18 – 2016-05-19 (×2): 1 mg via ORAL
  Filled 2016-05-18 (×2): qty 1

## 2016-05-18 MED ORDER — MOMETASONE FURO-FORMOTEROL FUM 200-5 MCG/ACT IN AERO
2.0000 | INHALATION_SPRAY | Freq: Two times a day (BID) | RESPIRATORY_TRACT | Status: DC
  Administered 2016-05-18: 2 via RESPIRATORY_TRACT
  Filled 2016-05-18: qty 8.8

## 2016-05-18 MED ORDER — THIAMINE HCL 100 MG/ML IJ SOLN: 100.0000 mg | Freq: Every day | INTRAMUSCULAR | Status: DC

## 2016-05-18 MED ORDER — ASPIRIN 325 MG PO TABS
325.0000 mg | ORAL_TABLET | Freq: Every day | ORAL | Status: DC
  Administered 2016-05-18 – 2016-05-19 (×2): 325 mg via ORAL
  Filled 2016-05-18 (×2): qty 1

## 2016-05-18 MED ORDER — ENOXAPARIN SODIUM 80 MG/0.8ML ~~LOC~~ SOLN
80.0000 mg | Freq: Every day | SUBCUTANEOUS | Status: DC
  Administered 2016-05-18 – 2016-05-19 (×2): 80 mg via SUBCUTANEOUS
  Filled 2016-05-18 (×2): qty 0.8

## 2016-05-18 MED ORDER — ATORVASTATIN CALCIUM 10 MG PO TABS
20.0000 mg | ORAL_TABLET | Freq: Every day | ORAL | Status: DC
  Administered 2016-05-18 – 2016-05-19 (×2): 20 mg via ORAL
  Filled 2016-05-18 (×3): qty 2

## 2016-05-18 MED ORDER — ADULT MULTIVITAMIN W/MINERALS CH
1.0000 | ORAL_TABLET | Freq: Every day | ORAL | Status: DC
  Administered 2016-05-18 – 2016-05-19 (×2): 1 via ORAL
  Filled 2016-05-18 (×2): qty 1

## 2016-05-18 MED ORDER — METOPROLOL TARTRATE 5 MG/5ML IV SOLN
5.0000 mg | Freq: Three times a day (TID) | INTRAVENOUS | Status: DC
  Administered 2016-05-18 – 2016-05-19 (×5): 5 mg via INTRAVENOUS
  Filled 2016-05-18 (×5): qty 5

## 2016-05-18 MED ORDER — PANTOPRAZOLE SODIUM 40 MG PO TBEC
40.0000 mg | DELAYED_RELEASE_TABLET | Freq: Every day | ORAL | Status: DC
Start: 2016-05-19 — End: 2016-05-19
  Administered 2016-05-19: 40 mg via ORAL
  Filled 2016-05-18: qty 1

## 2016-05-18 MED ORDER — IPRATROPIUM-ALBUTEROL 0.5-2.5 (3) MG/3ML IN SOLN
3.0000 mL | Freq: Once | RESPIRATORY_TRACT | Status: DC
  Filled 2016-05-18: qty 3

## 2016-05-18 MED ORDER — IPRATROPIUM-ALBUTEROL 0.5-2.5 (3) MG/3ML IN SOLN
3.0000 mL | Freq: Four times a day (QID) | RESPIRATORY_TRACT | Status: DC
  Administered 2016-05-18 (×2): 3 mL via RESPIRATORY_TRACT
  Filled 2016-05-18 (×3): qty 3

## 2016-05-18 MED ORDER — LORAZEPAM 2 MG/ML IJ SOLN: 1.0000 mg | Freq: Four times a day (QID) | INTRAMUSCULAR | Status: DC | PRN

## 2016-05-18 MED ORDER — IOPAMIDOL (ISOVUE-370) INJECTION 76%
INTRAVENOUS | Status: AC
  Filled 2016-05-18: qty 100

## 2016-05-18 MED ORDER — NICOTINE 14 MG/24HR TD PT24
14.0000 mg | MEDICATED_PATCH | Freq: Every day | TRANSDERMAL | Status: DC
Start: 2016-05-18 — End: 2016-05-19
  Administered 2016-05-18 – 2016-05-19 (×2): 14 mg via TRANSDERMAL
  Filled 2016-05-18 (×2): qty 1

## 2016-05-18 MED ORDER — VITAMIN B-1 100 MG PO TABS
100.0000 mg | ORAL_TABLET | Freq: Every day | ORAL | Status: DC
  Administered 2016-05-18 – 2016-05-19 (×2): 100 mg via ORAL
  Filled 2016-05-18 (×2): qty 1

## 2016-05-18 MED ORDER — IPRATROPIUM-ALBUTEROL 0.5-2.5 (3) MG/3ML IN SOLN
3.0000 mL | Freq: Three times a day (TID) | RESPIRATORY_TRACT | Status: DC
  Administered 2016-05-19: 3 mL via RESPIRATORY_TRACT
  Filled 2016-05-18 (×2): qty 3

## 2016-05-18 MED ORDER — DILTIAZEM HCL ER COATED BEADS 180 MG PO CP24
180.0000 mg | ORAL_CAPSULE | Freq: Every day | ORAL | Status: DC
Start: 2016-05-19 — End: 2016-05-19
  Administered 2016-05-19: 180 mg via ORAL
  Filled 2016-05-18: qty 1

## 2016-05-18 MED ORDER — LORAZEPAM 1 MG PO TABS: 2.0000 mg | ORAL_TABLET | Freq: Once | ORAL | Status: DC

## 2016-05-18 MED ORDER — SODIUM CHLORIDE 0.9 % IV SOLN
INTRAVENOUS | Status: DC
  Administered 2016-05-19: 06:00:00 via INTRAVENOUS

## 2016-05-18 NOTE — ED Notes (Addendum)
Pt has been comfortably sleeping for this RN since 4am; Pt was a little combative on arrival for prior RN; Pt given BP med at 6am; Pt's spouse is Crystal and can be reached at [phone]; pt has Ethanol of 200 on arrival.

## 2016-05-18 NOTE — Progress Notes (Signed)
Inpatient Diabetes Program Recommendations  AACE/ADA: New Consensus Statement on Inpatient Glycemic Control (2015)  Target Ranges:  Prepandial:   less than 140 mg/dL      Peak postprandial:   less than 180 mg/dL (1-2 hours)      Critically ill patients:  140 - 180 mg/dL   Lab Results  Component Value Date   GLUCAP 277 (H) 05/18/2016   HGBA1C 9.5 (H) 07/18/2015    Review of Glycemic ControlResults for Pecola LeisureMABE, Beniah (MRN [id number]) as of 05/18/2016 12:50  Ref. Range 05/17/2016 23:35 05/18/2016 03:59 05/18/2016 11:57  Glucose-Capillary Latest Ref Range: 65 - 99 mg/dL 409272 (H) 811279 (H) 914277 (H)    Diabetes history: Type 2 diabetes Outpatient Diabetes medications: Metformin 1000 mg bid Current orders for Inpatient glycemic control:  Novolog moderate tid with meals and HS  Inpatient Diabetes Program Recommendations:    Please consider adding basal insulin such as Lantus 25 units daily. A1C pending.   Thanks, Beryl MeagerJenny Regie Bunner, RN, BC-ADM Inpatient Diabetes Coordinator Pager [phone] (8a-5p)

## 2016-05-18 NOTE — ED Notes (Signed)
A repeat ekg was requested by the admitting doctor

## 2016-05-18 NOTE — Progress Notes (Signed)
STROKE TEAM PROGRESS NOTE   SUBJECTIVE (INTERVAL HISTORY) His wife and mom are at the bedside.  Overall he feels his condition is stable. He still has left hemiparesis but able to lift up the arm. Wife said left facial droop and slurry speech getting better than yesterday. Pt initially refused MRI due to anxiety but after my explanation, he is OK with it. Will give ativan.    OBJECTIVE Temp:  [97.7 F (36.5 C)] 97.7 F (36.5 C) (10/02 0758) Pulse Rate:  [80-108] 95 (10/02 0758) Cardiac Rhythm: Normal sinus rhythm;Atrial fibrillation (10/02 0833) Resp:  [14-21] 16 (10/02 0758) BP: (124-177)/(61-101) 157/79 (10/02 0758) SpO2:  [91 %-97 %] 93 % (10/02 0758) Weight:  [375 lb (170.1 kg)] 375 lb (170.1 kg) (10/02 0050)   Recent Labs Lab 05/17/16 2335 05/18/16 0359 05/18/16 1157  GLUCAP 272* 279* 277*    Recent Labs Lab 05/17/16 2334 05/17/16 2352 05/18/16 0405  NA 133* 134* 137  K 3.9 3.9 4.2  CL 96* 95* 100*  CO2 25  --  24  GLUCOSE 260* 260* 257*  BUN 7 8 7   CREATININE 0.96 1.20 0.90  CALCIUM 8.9  --  8.9    Recent Labs Lab 05/17/16 2334  AST 41  ALT 70*  ALKPHOS 133*  BILITOT 0.4  PROT 7.6  ALBUMIN 3.9    Recent Labs Lab 05/17/16 2334 05/17/16 2352  WBC 13.0*  --   NEUTROABS 8.6*  --   HGB 16.1 18.0*  HCT 48.0 53.0*  MCV 89.1  --   PLT 248  --     Recent Labs Lab 05/18/16 0405  TROPONINI <0.03    Recent Labs  05/17/16 2334  LABPROT 14.1  INR 1.09    Recent Labs  05/18/16 0058  COLORURINE YELLOW  LABSPEC <1.005*  PHURINE 5.5  GLUCOSEU >1000*  HGBUR TRACE*  BILIRUBINUR NEGATIVE  KETONESUR NEGATIVE  PROTEINUR NEGATIVE  NITRITE NEGATIVE  LEUKOCYTESUR NEGATIVE       Component Value Date/Time   CHOL 196 05/18/2016 0405   TRIG 324 (H) 05/18/2016 0405   HDL 33 (L) 05/18/2016 0405   CHOLHDL 5.9 05/18/2016 0405   VLDL 65 (H) 05/18/2016 0405   LDLCALC 98 05/18/2016 0405   Lab Results  Component Value Date   HGBA1C 9.5 (H)  07/18/2015      Component Value Date/Time   LABOPIA NONE DETECTED 05/18/2016 0058   COCAINSCRNUR NONE DETECTED 05/18/2016 0058   LABBENZ NONE DETECTED 05/18/2016 0058   AMPHETMU NONE DETECTED 05/18/2016 0058   THCU NONE DETECTED 05/18/2016 0058   LABBARB NONE DETECTED 05/18/2016 0058     Recent Labs Lab 05/17/16 2334  ETH 200*    I have personally reviewed the radiological images below and agree with the radiology interpretations.  Ct Angio Head and neck W Or Wo Contrast 05/18/2016 IMPRESSION: Early venous phase acquisition. Suboptimal assessment for small aneurysm or stenosis. 1. Patent carotid and vertebral arteries of the neck. 2. Patent circle of Willis.   Ct Angio Chest/abd/pel For Dissection W And/or W/wo 05/18/2016 IMPRESSION: No acute intrathoracic, abdominal, or pelvic pathology. No CT evidence of aortic dissection or aneurysm. Coronary vascular calcification advanced for the patient's age. Cardiology consult/referral is advised. An incidental finding of potential clinical significance has been found. A 12 mm right upper lobe ground-glass nodule. Initial follow-up with CT at 6-12 months is recommended to confirm persistence. If persistent, repeat CT is recommended every 2 years until 5 years of stability has been established. This  recommendation follows the consensus statement: Guidelines for Management of Incidental Pulmonary Nodules Detected on CT Images: From the Fleischner Society 2017; Radiology 2017; 284:228-243.  Ct Head Code Stroke W/o Cm 05/17/2016 IMPRESSION: 1. No acute intracranial abnormality is identified. 2. ASPECTS is 10   MRI and MRA pending  2D Echocardiogram  pending   PHYSICAL EXAM  Temp:  [97.7 F (36.5 C)] 97.7 F (36.5 C) (10/02 0758) Pulse Rate:  [80-108] 95 (10/02 0758) Resp:  [14-21] 16 (10/02 0758) BP: (124-177)/(61-101) 157/79 (10/02 0758) SpO2:  [91 %-97 %] 93 % (10/02 0758) Weight:  [375 lb (170.1 kg)] 375 lb (170.1 kg) (10/02  0050)  General - morbid obesity, well developed, in no apparent distress, depressed mood.  Ophthalmologic - fundi not visualized due to noncooperation.  Cardiovascular - Regular rate and rhythm with no murmur, not in afib rhythm.  Neck - supple, no carotid bruits  Mental Status -  Level of arousal and orientation to time, place, and person were intact. Language including expression, naming, repetition, comprehension was assessed and found intact. Mildly dyarthria. Fund of Knowledge was assessed and was intact.  Cranial Nerves II - XII - II - Visual field intact OU. III, IV, VI - Extraocular movements intact. V - Facial sensation decreased on the left, 50% of the right. VII - Facial movement intact bilaterally. VIII - Hearing & vestibular intact bilaterally. X - Palate elevates symmetrically, mildly dyarthric. XI - Chin turning & shoulder shrug intact bilaterally. XII - Tongue protrusion intact.  Motor Strength - The patient's strength was 5/5 RUE and RLE, but 3/5 LUE and 2/5 LLE proximal and 0/5 DF/PF.  Bulk was normal and fasciculations were absent.   Motor Tone - Muscle tone was assessed at the neck and appendages and was normal.  Reflexes - The patient's reflexes were symmetrical in all extremities and he had no pathological reflexes.  Sensory - Light touch, temperature/pinprick were assessed and were decreased on the left, about 60% of the right.    Coordination - The patient had normal movements in the right hand with no ataxia or dysmetria.  Tremor was absent.  Gait and Station - not tested due to weakness.   ASSESSMENT/PLAN [name] is a 45 y.o. 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 Symptoms stable.    Stroke:  Suspect a right subcortical infarct may be small vessel disease due to stroke risk factors of DM, HTN, smoker,  morbid obesity, ? OSA. However, hypercoagulable state due to secondary polycythymia or cardioembolic due to pAfib are still in DDx. MRI pending   MRI  pending  MRA  pending  CTA head and neck - negative   2D Echo  pending  LDL 98  HgbA1c pending  lovenox for VTE prophylaxis  Diet Heart Room service appropriate? Yes; Fluid consistency: Thin   Xarelto (rivaroxaban) daily prior to admission, now on aspirin 325 mg daily. Will decide regimen after MRI brain.   Patient counseled to be compliant with his antithrombotic medications  Ongoing aggressive stroke risk factor management  Therapy recommendations:  Pending   Disposition:  Pending  pAfib s/p ablation  On amiodarone  On Xarelto - pt stated compliance  Currently not in afib rhythm  Follows up with Dr. Ladona Ridgelaylor  On ASA now, will resume Xarelto or change to Eliquis depends on MRI result  Secondary polycythemia  Due to smoking  Last phlebotomy 03/2016  Hb 16.1->18.0  Recommend IVF and avoid dehydration  Follows with [facility]  Diabetes  HgbA1c pending goal < 7.0  Uncontrolled  Currently on metformin at home  CBG monitoring  SSI   Hypertension  Home meds:   none Permissive hypertension (OK if <220/120) for 24-48 hours post stroke and then gradually normalized within 5-7 days. Currently on none  Stable  Hyperlipidemia  Home meds:  none   LDL 98, goal < 70  Add lipitor 20mg   Continue statin at discharge  Tobacco abuse  Current smoker  Smoking cessation counseling provided  Pt is willing to quit  Morbid obesity  BMI 42.25  Recommend weight loss program  Healthy diet and regular exercise  Other Stroke Risk Factors  Coronary artery disease - shown on CTA  Likely Obstructive sleep apnea - need outpt sleepy study  Other Active Problems  High TG  Other Pertinent History  Leukocytosis - mild  Hospital day # 0   Marvel Plan, MD PhD Stroke Neurology 05/18/2016 1:28  PM    To contact Stroke Continuity provider, please refer to [email or web address]. After hours, contact General Neurology

## 2016-05-18 NOTE — Evaluation (Signed)
Physical Therapy Evaluation Patient Details Name: Dean LeisureRobert Tewksbury MRN: [id number] DOB: 05/14/1971 Today's Date: 05/18/2016   History of Present Illness  Pt adm with lt sided weakness and ETOH level of 200.  CT neg. Repeat CT pending. PMH - OA of bil hips, afib, HTN, anxiety, DM, Wolf Parkinson White Syndrome, morbid obesity  Clinical Impression  Pt admitted with above diagnosis and presents to PT with functional limitations due to deficits listed below (See PT problem list). Pt needs skilled PT to maximize independence and safety to allow discharge to home with family support. Expect mobility will be adequate to return home but feel he could benefit from OPPT to assist with return for work.     Follow Up Recommendations Outpatient PT    Equipment Recommendations  Other (comment) (To be assessed)    Recommendations for Other Services       Precautions / Restrictions Precautions Precautions: Fall Restrictions Weight Bearing Restrictions: No      Mobility  Bed Mobility Overal bed mobility: Modified Independent             General bed mobility comments: Incr time and effort  Transfers Overall transfer level: Needs assistance Equipment used: None Transfers: Sit to/from Stand Sit to Stand: Supervision         General transfer comment: Assist for safety  Ambulation/Gait Ambulation/Gait assistance: Min guard Ambulation Distance (Feet): 60 Feet Assistive device: None (Pushing IV pole) Gait Pattern/deviations: Step-through pattern;Decreased step length - left;Wide base of support Gait velocity: decr Gait velocity interpretation: Below normal speed for age/gender General Gait Details: Assist for safety. LLE fatigues quickly with pt relying more on IV pole as distance increases  Stairs            Wheelchair Mobility    Modified Rankin (Stroke Patients Only) Modified Rankin (Stroke Patients Only) Pre-Morbid Rankin Score: No symptoms Modified Rankin: Moderately  severe disability     Balance Overall balance assessment: Needs assistance Sitting-balance support: No upper extremity supported;Feet supported Sitting balance-Leahy Scale: Good     Standing balance support: No upper extremity supported;During functional activity Standing balance-Leahy Scale: Fair                               Pertinent Vitals/Pain Pain Assessment: No/denies pain    Home Living Family/patient expects to be discharged to:: Private residence Living Arrangements: Spouse/significant other Available Help at Discharge: Family (significant other) Type of Home: House Home Access: Stairs to enter Entrance Stairs-Rails: Doctor, general practiceight;Left Entrance Stairs-Number of Steps: 3-4 Home Layout: One level Home Equipment: None      Prior Function Level of Independence: Independent         Comments: Works in Education officer, [employer]     Hand Dominance   Dominant Hand: Right    Extremity/Trunk Assessment   Upper Extremity Assessment: Defer to OT evaluation           Lower Extremity Assessment: LLE deficits/detail   LLE Deficits / Details: to manual muscle test 3-/5 but functionally able to support pt in weight bearing     Communication   Communication: No difficulties  Cognition Arousal/Alertness: Awake/alert Behavior During Therapy: Flat affect Overall Cognitive Status: Impaired/Different from baseline Area of Impairment: Problem solving             Problem Solving: Slow processing General Comments: Pt reports he just feels a little slow.    General Comments      Exercises  Assessment/Plan    PT Assessment Patient needs continued PT services  PT Problem List Decreased strength;Decreased activity tolerance;Decreased balance;Decreased mobility;Obesity          PT Treatment Interventions DME instruction;Gait training;Stair training;Functional mobility training;Therapeutic activities;Therapeutic exercise;Balance training;Patient/family  education    PT Goals (Current goals can be found in the Care Plan section)  Acute Rehab PT Goals Patient Stated Goal: return home PT Goal Formulation: With patient Time For Goal Achievement: 05/25/16 Potential to Achieve Goals: Good    Frequency Min 4X/week   Barriers to discharge        Co-evaluation               End of Session   Activity Tolerance: Patient tolerated treatment well Patient left: in bed;with call bell/phone within reach;with bed alarm set;with family/visitor present Nurse Communication: Mobility status    Functional Assessment Tool Used: clinical judgement Functional Limitation: Mobility: Walking and moving around Mobility: Walking and Moving Around Current Status (Z6109): At least 1 percent but less than 20 percent impaired, limited or restricted Mobility: Walking and Moving Around Goal Status [phone]): 0 percent impaired, limited or restricted    Time: 1151-1210 PT Time Calculation (min) (ACUTE ONLY): 19 min   Charges:   PT Evaluation $PT Eval Moderate Complexity: 1 Procedure     PT G Codes:   PT G-Codes **NOT FOR INPATIENT CLASS** Functional Assessment Tool Used: clinical judgement Functional Limitation: Mobility: Walking and moving around Mobility: Walking and Moving Around Current Status (U9811): At least 1 percent but less than 20 percent impaired, limited or restricted Mobility: Walking and Moving Around Goal Status [phone]): 0 percent impaired, limited or restricted    [facility] 05/18/2016, 12:29 PM [facility] [phone]

## 2016-05-18 NOTE — Care Management Obs Status (Signed)
MEDICARE OBSERVATION STATUS NOTIFICATION   Patient Details  Name: Dean [name] MRN: [id number] Date of Birth: 06/24/1971   Medicare Observation Status Notification Given:  Yes    Kermit BaloKelli F Ashantee Deupree, RN 05/18/2016, 1:31 PM

## 2016-05-18 NOTE — H&P (Signed)
History and Physical  Patient Name: Dean [name]     ZOX:[id number]    DOB: 1970-09-17    DOA: 05/17/2016 PCP: Mickle Plumb, NP   Patient coming from: Home     Chief Complaint: Left-sided weakness  HPI: Dean [name] is a 45 y.o. male with a past medical history significant for atrial fibrillation on Xarelto, WPW s/p numerous ablations, and HTN who presents with left sided weakness and intoxication.  The patient was drinking with friends and family at home tonight ("perhaps 30 beers" per partner) when he fell off a stool he was balancing on and struck the back of his head.    Shortly after, he went in to go to bed, went into the bathroom and then came out telling his partner that he "didn't feel right" and had chest discomfort. He put down his cigarette, and told her that he felt nauseated, and then went to the bathroom where his partner heard him dry heaving. Shortly after he came out of the bathroom and fell on the ground, and when his partner reached him, he said he "couldn't get up" so she called 9-1-1. When EMS arrived they found him with left-sided weakness and transported to the ER as a code stroke.  ED course: -Heart rate 80s, respirations and pulse ox normal, blood pressure 153/76 -Na 133, K 3.9, Cr 0.96, WBC 13 K, Hgb 16 -Alcohol level 200 mg/dL -Coags normal, troponin negative, urine drug screening negative, urinalysis glucosuria -CT of the head showed no fracture or intracranial bleeding -CTA of the chest showed coronary calcifications advanced for age with no dissection -CT angiogram of the head and neck showed patent carotids and vertebral arteries and circle of Willis -He was evaluated by neurology who suspected a stroke and TRH were asked to evaluate for admission     Review of systems:  Review of Systems  Unable to perform ROS: Patient unresponsive         Past Medical History:  Diagnosis Date  . Anxiety   . Asthma   . Atrial arrhythmia    multiples with 5-6  ablations  . Atrial fibrillation with rapid ventricular response (HCC)    With rates up to 200 as well as short PR tachycardia, cycle length of 240 ms.  . Chest tightness 05/08/14  . Diabetes mellitus, type II (HCC)   . Morbid obesity (HCC)   . Palpitations 05/08/14  . Snores    Wil occasional apnea. Had a remote sleep study but does not remember results.  . SOB (shortness of breath) 05/08/14  . SVT (supraventricular tachycardia) (HCC)   . WPW (Wolff-Parkinson-White syndrome)     Past Surgical History:  Procedure Laterality Date  . ABLATION    . WRIST SURGERY      Social History: Patient lives with his girlfriend and daugther.  Patient walks unassisted.  He smokes.  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  He uses alcohol only on weekends, typically 12+ beers at a time.    Allergies  Allergen Reactions  . Prednisone Other (See Comments)    "goes crazy"    Family history: family history includes Clotting disorder in his father; Diabetes in his father; Heart attack in his father; Hypertension in his father.  Prior to Admission medications   Medication Sig Start Date End Date Taking? Authorizing Provider  alprazolam Prudy Feeler) 2 MG tablet Take 2 mg by mouth 2 (two) times daily.    Yes Historical Provider, MD  amiodarone (PACERONE) 200 MG tablet Take  1 tablet (200 mg total) by mouth daily. 03/02/16  Yes Marinus Maw, MD  budesonide-formoterol [facility]) 160-4.5 MCG/ACT inhaler Inhale 2 puffs into the lungs daily as needed (shortness of breath).    Yes Historical Provider, MD  cetirizine (ZYRTEC) 10 MG tablet Take 10 mg by mouth daily.   Yes Historical Provider, MD  diltiazem (CARDIZEM CD) 180 MG 24 hr capsule Take 180 mg by mouth daily.    Yes Historical Provider, MD  HYDROcodone-acetaminophen (NORCO/VICODIN) 5-325 MG tablet Take 1-2 tablets by mouth every 6 (six) hours as needed (pain).  08/16/15  Yes Historical Provider, MD  ipratropium (ATROVENT) 0.02 % nebulizer solution Take 250 mcg by  nebulization every 4 (four) hours as needed. As needed for shortness of breath   Yes Historical Provider, MD  levalbuterol (XOPENEX HFA) 45 MCG/ACT inhaler Inhale 1-2 puffs into the lungs every 6 (six) hours as needed for wheezing or shortness of breath.   Yes Historical Provider, MD  metFORMIN (GLUCOPHAGE) 1000 MG tablet Take 1 tablet by mouth 2 (two) times daily. 08/17/15  Yes Historical Provider, MD  montelukast (SINGULAIR) 10 MG tablet Take 10 mg by mouth every morning.    Yes Historical Provider, MD  multivitamin-iron-minerals-folic acid (CENTRUM) chewable tablet Chew 1 tablet by mouth daily.   Yes Historical Provider, MD  pantoprazole (PROTONIX) 40 MG tablet Take 40 mg by mouth daily.  08/16/15 08/15/16 Yes Historical Provider, MD  rivaroxaban (XARELTO) 20 MG TABS tablet Take 20 mg by mouth every morning.  07/18/15  Yes Marinus Maw, MD     Physical Exam: BP 175/73   Pulse 88   Resp 17   Ht 6\' 7"  (2.007 m)   Wt (!) 170.1 kg (375 lb)   SpO2 94%   BMI 42.25 kg/m  General appearance: Well-developed, adult male, somnolent and not arousable, shifts with noxious stimuli, snoring Eyes: Anicteric, conjunctiva pink, lids and lashes normal. PERRL.    ENT: No nasal deformity, discharge, epistaxis.  OP dry without lesions.   Dentition normal. Lymph: No cervical, supraclavicular or axillary lymphadenopathy. Skin: Warm and dry.  Sunburnt.  No jaundice.  No suspicious rashes or lesions. Cardiac: RRR, nl S1-S2, no murmurs appreciated.  Capillary refill is brisk.  JVP normal.  No LE edema.  Radial and DP pulses 2+ and symmetric.  No carotid bruits. Respiratory: Normal respiratory rate and rhythm.  Snoring respirations, coarse expiratory sounds, ?wheezing. GI: Abdomen soft without rigidity.  No TTP. No ascites, distension, no hepatosplenomegaly.   MSK: No deformities or effusions. Neuro: Pupils are 6 mm and reactive to 4 mm. Otherwise does not follow commands or respond to questions.  Psych:  Unable to assess.    Labs on Admission:  I have personally reviewed following labs and imaging studies: CBC:  Recent Labs Lab 05/17/16 2334 05/17/16 2352  WBC 13.0*  --   NEUTROABS 8.6*  --   HGB 16.1 18.0*  HCT 48.0 53.0*  MCV 89.1  --   PLT 248  --    Basic Metabolic Panel:  Recent Labs Lab 05/17/16 2334 05/17/16 2352  NA 133* 134*  K 3.9 3.9  CL 96* 95*  CO2 25  --   GLUCOSE 260* 260*  BUN 7 8  CREATININE 0.96 1.20  CALCIUM 8.9  --    GFR: Estimated Creatinine Clearance: 139.5 mL/min (by C-G formula based on SCr of 1.2 mg/dL). Liver Function Tests:  Recent Labs Lab 05/17/16 2334  AST 41  ALT 70*  ALKPHOS  133*  BILITOT 0.4  PROT 7.6  ALBUMIN 3.9   No results for input(s): LIPASE, AMYLASE in the last 168 hours. No results for input(s): AMMONIA in the last 168 hours. Coagulation Profile:  Recent Labs Lab 05/17/16 2334  INR 1.09   Cardiac Enzymes: No results for input(s): CKTOTAL, CKMB, CKMBINDEX, TROPONINI in the last 168 hours. BNP (last 3 results) No results for input(s): PROBNP in the last 8760 hours. HbA1C: No results for input(s): HGBA1C in the last 72 hours. CBG:  Recent Labs Lab 05/17/16 2335  GLUCAP 272*   Lipid Profile: No results for input(s): CHOL, HDL, LDLCALC, TRIG, CHOLHDL, LDLDIRECT in the last 72 hours. Thyroid Function Tests: No results for input(s): TSH, T4TOTAL, FREET4, T3FREE, THYROIDAB in the last 72 hours. Anemia Panel: No results for input(s): VITAMINB12, FOLATE, FERRITIN, TIBC, IRON, RETICCTPCT in the last 72 hours. Sepsis Labs: Invalid input(s): PROCALCITONIN, LACTICIDVEN No results found for this or any previous visit (from the past 240 hour(s)).    Radiological Exams on Admission: Personally reviewed the following reports: Ct Angio Head W Or Wo Contrast  Result Date: 05/18/2016 CLINICAL DATA:  45 y/o  M; code stroke with right arm weakness. EXAM: CT ANGIOGRAPHY HEAD AND NECK TECHNIQUE: Multidetector CT  imaging of the head and neck was performed using the standard protocol during bolus administration of intravenous contrast. Multiplanar CT image reconstructions and MIPs were obtained to evaluate the vascular anatomy. Carotid stenosis measurements (when applicable) are obtained utilizing NASCET criteria, using the distal internal carotid diameter as the denominator. Manually triggered contrast bolus due to insufficient opacification of the aorta for automatic triggering. CONTRAST:  70 cc Isovue 370. COMPARISON:  05/17/2016 CT of the head. FINDINGS: CTA NECK FINDINGS Early venous phase acquisition. Suboptimal assessment for small aneurysm or stenosis. Aortic arch: Standard branching. Imaged portion shows no evidence of aneurysm or dissection. No significant stenosis of the major arch vessel origins. Right carotid system: Patent.  No high-grade stenosis. Left carotid system: Patent.  No high-grade stenosis. Vertebral arteries: Patent. Skeleton: Multilevel cervical spondylosis greatest from C5 through C7 with there are endplate degenerative changes and marginal osteophytes. No high-grade bony canal stenosis or foraminal narrowing. Other neck: None. Upper chest: Clear appear Review of the MIP images confirms the above findings CTA HEAD FINDINGS Early venous phase acquisition. Suboptimal assessment for small aneurysm or stenosis. Anterior circulation: Bilateral M1 and A1 segments are patent. Distal anterior and middle cerebral artery circulation appears symmetric. Internal carotid arteries are obscured at the level of the distal cavernous segment due to artifact from bone. Otherwise the internal carotid arteries are patent. Posterior circulation: Bilateral vertebral arteries, the basilar artery, and bilateral posterior cerebral arteries are patent. Venous sinuses: As permitted by contrast timing, patent. Anatomic variants: No definite posterior communicating artery is identified, likely hypoplastic or absent. Review of  the MIP images confirms the above findings IMPRESSION: Early venous phase acquisition. Suboptimal assessment for small aneurysm or stenosis. 1. Patent carotid and vertebral arteries of the neck. 2. Patent circle of Willis. Electronically Signed   By: Mitzi Hansen M.D.   On: 05/18/2016 01:05   Ct Angio Neck W Or Wo Contrast  Result Date: 05/18/2016 CLINICAL DATA:  45 y/o  M; code stroke with right arm weakness. EXAM: CT ANGIOGRAPHY HEAD AND NECK TECHNIQUE: Multidetector CT imaging of the head and neck was performed using the standard protocol during bolus administration of intravenous contrast. Multiplanar CT image reconstructions and MIPs were obtained to evaluate the vascular anatomy. Carotid  stenosis measurements (when applicable) are obtained utilizing NASCET criteria, using the distal internal carotid diameter as the denominator. Manually triggered contrast bolus due to insufficient opacification of the aorta for automatic triggering. CONTRAST:  70 cc Isovue 370. COMPARISON:  05/17/2016 CT of the head. FINDINGS: CTA NECK FINDINGS Early venous phase acquisition. Suboptimal assessment for small aneurysm or stenosis. Aortic arch: Standard branching. Imaged portion shows no evidence of aneurysm or dissection. No significant stenosis of the major arch vessel origins. Right carotid system: Patent.  No high-grade stenosis. Left carotid system: Patent.  No high-grade stenosis. Vertebral arteries: Patent. Skeleton: Multilevel cervical spondylosis greatest from C5 through C7 with there are endplate degenerative changes and marginal osteophytes. No high-grade bony canal stenosis or foraminal narrowing. Other neck: None. Upper chest: Clear appear Review of the MIP images confirms the above findings CTA HEAD FINDINGS Early venous phase acquisition. Suboptimal assessment for small aneurysm or stenosis. Anterior circulation: Bilateral M1 and A1 segments are patent. Distal anterior and middle cerebral artery  circulation appears symmetric. Internal carotid arteries are obscured at the level of the distal cavernous segment due to artifact from bone. Otherwise the internal carotid arteries are patent. Posterior circulation: Bilateral vertebral arteries, the basilar artery, and bilateral posterior cerebral arteries are patent. Venous sinuses: As permitted by contrast timing, patent. Anatomic variants: No definite posterior communicating artery is identified, likely hypoplastic or absent. Review of the MIP images confirms the above findings IMPRESSION: Early venous phase acquisition. Suboptimal assessment for small aneurysm or stenosis. 1. Patent carotid and vertebral arteries of the neck. 2. Patent circle of Willis. Electronically Signed   By: Mitzi Hansen M.D.   On: 05/18/2016 01:05   Ct Angio Chest/abd/pel For Dissection W And/or W/wo  Result Date: 05/18/2016 CLINICAL DATA:  45 year old male follow-up for code stroke with left-sided chest pain and right arm weakness. Evaluate for dissection. EXAM: CT ANGIOGRAPHY CHEST, ABDOMEN AND PELVIS TECHNIQUE: Multidetector CT imaging through the chest, abdomen and pelvis was performed using the standard protocol during bolus administration of intravenous contrast. Multiplanar reconstructed images and MIPs were obtained and reviewed to evaluate the vascular anatomy. CONTRAST:  100 cc Isovue 370 COMPARISON:  Chest radiograph dated 05/08/2014 FINDINGS: CTA CHEST FINDINGS Cardiovascular: The thoracic aorta appears unremarkable. There is no aneurysmal dilatation or evidence of dissection. The origins of the great vessels of the aortic arch appear patent. The main pulmonary trunk is patent. There is no cardiomegaly or pericardial effusion. There is coronary vascular calcification involving the LAD, advanced for the patient's age. Cardiology consult/referral is advised. Mediastinum/Nodes: There is no hilar or mediastinal adenopathy. The esophagus is grossly unremarkable. No  thyroid nodules identified. Lungs/Pleura: There is a 12 mm ground-glass nodular density in the right upper lobe (series 19 image 36). The lungs are otherwise clear. There is no pleural effusion or pneumothorax. The central airways are patent. Musculoskeletal: There is no axillary adenopathy. The chest wall soft tissues appear unremarkable. The osseous structures are intact. Review of the MIP images confirms the above findings. CTA ABDOMEN AND PELVIS FINDINGS VASCULAR Aorta: Normal caliber aorta without aneurysm, dissection, vasculitis or significant stenosis. Celiac: Patent without evidence of aneurysm, dissection, vasculitis or significant stenosis. SMA: Patent without evidence of aneurysm, dissection, vasculitis or significant stenosis. Renals: Both renal arteries are patent without evidence of aneurysm, dissection, vasculitis, fibromuscular dysplasia or significant stenosis. IMA: Patent without evidence of aneurysm, dissection, vasculitis or significant stenosis. Veins: No obvious venous abnormality within the limitations of this arterial phase study. Review of the MIP  images confirms the above findings. NON-VASCULAR Hepatobiliary: Diffuse fatty infiltration of the liver. No intrahepatic biliary ductal dilatation. The gallbladder is predominantly collapsed. Pancreas: Unremarkable. No pancreatic ductal dilatation or surrounding inflammatory changes. Spleen: Normal in size without focal abnormality. Adrenals/Urinary Tract: Adrenal glands are unremarkable. Kidneys are normal, without renal calculi, focal lesion, or hydronephrosis. Bladder is unremarkable. Stomach/Bowel: Moderate stool throughout the colon. No evidence of bowel obstruction or active inflammation. Normal appendix. Lymphatic: No lymphadenopathy. Reproductive: The prostate and seminal vesicles are grossly unremarkable. Other: Small fat containing umbilical hernia. Musculoskeletal: Right L4 pars defect. No listhesis. L5-S1 disc desiccation with vacuum  phenomena. No acute fracture. Review of the MIP images confirms the above findings. IMPRESSION: No acute intrathoracic, abdominal, or pelvic pathology. No CT evidence of aortic dissection or aneurysm. Coronary vascular calcification advanced for the patient's age. Cardiology consult/referral is advised. **An incidental finding of potential clinical significance has been found. A 12 mm right upper lobe ground-glass nodule. Initial follow-up with CT at 6-12 months is recommended to confirm persistence. If persistent, repeat CT is recommended every 2 years until 5 years of stability has been established. This recommendation follows the consensus statement: Guidelines for Management of Incidental Pulmonary Nodules Detected on CT Images: From the Fleischner Society 2017; Radiology 2017; 284:228-243.** Electronically Signed   By: Elgie CollardArash  Radparvar M.D.   On: 05/18/2016 01:42   Ct Head Code Stroke W/o Cm  Result Date: 05/17/2016 CLINICAL DATA:  Code stroke.  Left-sided deficits and confusion. EXAM: CT HEAD WITHOUT CONTRAST TECHNIQUE: Contiguous axial images were obtained from the base of the skull through the vertex without intravenous contrast. COMPARISON:  None. FINDINGS: Brain: No evidence of acute infarction, hemorrhage, hydrocephalus, extra-axial collection or mass lesion/mass effect. Motion and streak artifact partially obscuring temporal lobes and cerebellum. Vascular: No hyperdense vessel or unexpected calcification. Skull: Normal. Negative for fracture or focal lesion. Sinuses/Orbits: Mild ethmoid sinus mucosal thickening. Orbits are unremarkable. Other: None. ASPECTS [facility]([place] Stroke Program Early CT Score) - Ganglionic level infarction (caudate, lentiform nuclei, internal capsule, insula, M1-M3 cortex): 7 - Supraganglionic infarction (M4-M6 cortex): 3 Total score (0-10 with 10 being normal): 10 IMPRESSION: 1. No acute intracranial abnormality is identified. 2. ASPECTS is 10 These results were called by  telephone at the time of interpretation on 05/17/2016 at 11:58 pm to Dr. Pincus LargeKrikpatrick, who verbally acknowledged these results. Electronically Signed   By: Mitzi HansenLance  Furusawa-Stratton M.D.   On: 05/17/2016 23:59     EKG: Independently reviewed. Rate 88, QTc 476, sinus rhythm without ST changes.    Assessment/Plan 1. Suspected Acute Stroke:  This is new.  MRI pending.  Failed swallow bedside eval in ER. -Admit to telemetry -Neuro checks, NIHSS per protocol -Daily aspirin 325 mg -Permissive hypertension for now -Lipids, hemoglobin A1c -Carotid doppler, MRA per Neurology, ordered -Echocardiogram ordered -PT/OT/SLP consultation -Consult to Neurology, appreciate recommendations   2. HTN:  -Permissive HTN is okay  3. NIDDM:  Reported history, not currently on antiglycemics.  Hyperglycemic on admission. -SSI q4hrs while NPO  4. Atrial fibrillation:  CHADS2Vasc 2.  On Xarelto. -Restart Xarelto and stop Lovenox subQ ppx dose when able to swallow and pending MRI -Restart amiodarone when able -Metoprolol IV q8hrs while NPO, then restart diltiazem -Trend troponin  5. Alcohol intoxication: -Fluids -CIWA protocol       DVT prophylaxis: Lovenox  Code Status: FULL  Family Communication: Girlfriend at bedside.  Disposition Plan: Anticipate Stroke work up as above and consult to ancillary services.  Expect discharge within 1  day. Consults called: Neurology, Dr. Amada Jupiter has seen patient. Admission status: Telemetry, OBS status  Core measures: -VTE prophylaxis ordered at time of admission -Aspirin ordered at admission -Atrial fibrillation: known, and compliant with anticoagulation -tPA not given because of chronic anticoagulation -Dysphagia screen ordered in ER and failed -Lipids ordered -PT eval ordered -Smoking cessation teaching ordered    Medical decision making: Patient seen at 2:20 AM on 05/18/2016.  The patient was discussed with Dr. Madilyn Hook. What exists of the patient's  chart was reviewed in depth and outside records in [facility] were requested and summarized above.  Clinical condition: stable.       Alberteen Sam [facility] Pager [phone]

## 2016-05-18 NOTE — Progress Notes (Signed)
Pt arrived to unit via ED stretcher. Informed patient on what to expect with hospital activities today and further stroke work-up diagnostics.  Pt adamantly refusing MRI. States he tried to do it once and had a bad experience.  States "no one will put him in a barrel". Transporter arrived to take patient to MRI. Pt will not go at this time and MD notified. Lawson RadarHeather M Antara Brecheisen

## 2016-05-18 NOTE — Evaluation (Signed)
Speech Language Pathology Evaluation Patient Details Name: Dean [name] MRN: [id number] DOB: 12-12-1970 Today's Date: 05/18/2016 Time: [id number] SLP Time Calculation (min) (ACUTE ONLY): 13 min  Problem List:  Patient Active Problem List   Diagnosis Date Noted  . Stroke-like symptoms 05/18/2016  . Cerebrovascular accident (CVA) due to thrombosis of right middle cerebral artery (HCC)   . Mixed hyperlipidemia   . Long term current use of anticoagulant 08/16/2015  . Gastric catarrh 08/16/2015  . History of biliary T-tube placement 08/16/2015  . Personal history of other diseases of the circulatory system 08/16/2015  . Polycythemia, secondary 08/16/2015  . Paroxysmal atrial fibrillation (HCC) 07/18/2015  . Morbid obesity (HCC) 07/18/2015  . Essential hypertension 07/18/2015  . Type 2 diabetes mellitus without complication, without long-term current use of insulin (HCC) 05/10/2014  . SVT (supraventricular tachycardia) (HCC) 05/09/2014  . WPW (Wolff-Parkinson-White syndrome) 05/09/2014  . Avascular necrosis of bone of hip (HCC) 07/12/2013  . Acid reflux 07/12/2013  . Arthralgia of hip 07/12/2013  . Chest pain of unknown etiology 05/01/2013   Past Medical History:  Past Medical History:  Diagnosis Date  . Anxiety   . Asthma   . Atrial arrhythmia    multiples with 5-6 ablations  . Atrial fibrillation with rapid ventricular response (HCC)    With rates up to 200 as well as short PR tachycardia, cycle length of 240 ms.  . Chest tightness 05/08/14  . Diabetes mellitus, type II (HCC)   . Morbid obesity (HCC)   . Palpitations 05/08/14  . Snores    Wil occasional apnea. Had a remote sleep study but does not remember results.  . SOB (shortness of breath) 05/08/14  . SVT (supraventricular tachycardia) (HCC)   . WPW (Wolff-Parkinson-White syndrome)    Past Surgical History:  Past Surgical History:  Procedure Laterality Date  . ABLATION    . WRIST SURGERY     HPI:  Pt adm with left  sided weakness, slurred speech and ETOH level of 200.CT neg. MRI  pending. PMH:  OA of bil hips, afib, HTN, anxiety, DM, Wolf Parkinson White Syndrome, morbid obesity.   Assessment / Plan / Recommendation Clinical Impression  Pt scored a 21 on MOCA with difficulty in the following subtests: simple calculations, recall and abstractions. Pt states his "thinking is a little slower." Pt would benefit from short term ST for complex cognitive tasks promoting independence    SLP Assessment       Follow Up Recommendations  None    Frequency and Duration min 1 x/week  1 week      SLP Evaluation Cognition  Overall Cognitive Status: Impaired/Different from baseline Arousal/Alertness: Awake/alert Orientation Level: Oriented X4 Attention: Sustained Sustained Attention: Appears intact Memory: Impaired Memory Impairment: Storage deficit;Retrieval deficit Awareness: Appears intact Problem Solving: Appears intact Safety/Judgment:  (questionable)       Comprehension  Auditory Comprehension Overall Auditory Comprehension: Appears within functional limits for tasks assessed Visual Recognition/Discrimination Discrimination: Not tested Reading Comprehension Reading Status: Not tested    Expression Expression Primary Mode of Expression: Verbal Verbal Expression Overall Verbal Expression: Appears within functional limits for tasks assessed Initiation: No impairment Level of Generative/Spontaneous Verbalization: Sentence Repetition: No impairment Naming: Not tested Pragmatics: No impairment Written Expression Dominant Hand: Right Written Expression: Not tested   Oral / Motor  Oral Motor/Sensory Function Overall Oral Motor/Sensory Function: Mild impairment Facial ROM: Reduced left;Suspected CN VII (facial) dysfunction Facial Symmetry: Abnormal symmetry left;Suspected CN VII (facial) dysfunction Facial Strength: Reduced left Facial Sensation: Within  Functional Limits Lingual ROM:  (?  decreased protrusion) Lingual Symmetry: Within Functional Limits Velum: Within Functional Limits Mandible: Within Functional Limits Motor Speech Overall Motor Speech: Impaired Respiration: Within functional limits Phonation: Normal Resonance: Within functional limits Articulation: Within functional limitis Intelligibility: Intelligible (possible distortion if rate increased) Motor Planning: Witnin functional limits   GO          Functional Assessment Tool Used: skilled clinical judgement Functional Limitations: Memory Swallow Current Status (Z6109): 0 percent impaired, limited or restricted Swallow Goal Status (U0454): 0 percent impaired, limited or restricted Swallow Discharge Status (U9811): 0 percent impaired, limited or restricted Memory Current Status (B1478): At least 60 percent but less than 80 percent impaired, limited or restricted Memory Goal Status (G9562): At least 40 percent but less than 60 percent impaired, limited or restricted         Royce Macadamia 05/18/2016, 3:12 PM  Breck Coons [place].Ed [employer] [phone]

## 2016-05-18 NOTE — Progress Notes (Addendum)
Patient admitted after midnight, please see H&P.   -refusing MRI-- will defer to neurology repeat head CT -CTA of chest showed advanced CAD--- cardiology consult-- smoker, obese -CIWA for alcohol abuse-- level high upon admission -add duonebs and xopenex for wheezing/coarse breath sounds- avoid prednisone for now due to "reaction"  Dean CanaryJessica Cornellius Kropp DO

## 2016-05-18 NOTE — ED Notes (Signed)
Admitting doctor at the bedside 

## 2016-05-18 NOTE — Evaluation (Signed)
Clinical/Bedside Swallow Evaluation Patient Details  Name: Dean [name] MRN: [id number] Date of Birth: 10-Apr-1971  Today's Date: 05/18/2016 Time: SLP Start Time (ACUTE ONLY): 1429 SLP Stop Time (ACUTE ONLY): 1442 SLP Time Calculation (min) (ACUTE ONLY): 13 min  Past Medical History:  Past Medical History:  Diagnosis Date  . Anxiety   . Asthma   . Atrial arrhythmia    multiples with 5-6 ablations  . Atrial fibrillation with rapid ventricular response (HCC)    With rates up to 200 as well as short PR tachycardia, cycle length of 240 ms.  . Chest tightness 05/08/14  . Diabetes mellitus, type II (HCC)   . Morbid obesity (HCC)   . Palpitations 05/08/14  . Snores    Wil occasional apnea. Had a remote sleep study but does not remember results.  . SOB (shortness of breath) 05/08/14  . SVT (supraventricular tachycardia) (HCC)   . WPW (Wolff-Parkinson-White syndrome)    Past Surgical History:  Past Surgical History:  Procedure Laterality Date  . ABLATION    . WRIST SURGERY     HPI:  Pt adm with left sided weakness, slurred speech and ETOH level of 200.CT neg. MRI  pending. PMH:  OA of bil hips, afib, HTN, anxiety, DM, Wolf Parkinson White Syndrome, morbid obesity.   Assessment / Plan / Recommendation Clinical Impression  Pt scored a 21 on MOCA with difficulty in the following subtests: simple calculations, recall and abstractions. Pt states his "thinking is a little slower." Pt would benefit from short term ST for complex cognitive tasks promoting independence    Aspiration Risk  Mild aspiration risk    Diet Recommendation Regular;Thin liquid   Liquid Administration via: Cup;Straw Medication Administration: Whole meds with liquid Supervision: Patient able to self feed Compensations: Slow rate;Small sips/bites Postural Changes: Seated upright at 90 degrees    Other  Recommendations Oral Care Recommendations: Oral care BID   Follow up Recommendations None      Frequency and  Duration            Prognosis        Swallow Study   General HPI: Pt adm with left sided weakness, slurred speech and ETOH level of 200.CT neg. MRI  pending. PMH:  OA of bil hips, afib, HTN, anxiety, DM, Wolf Parkinson White Syndrome, morbid obesity. Type of Study: Bedside Swallow Evaluation Previous Swallow Assessment:  (none) Diet Prior to this Study: Regular;Thin liquids Temperature Spikes Noted: No Respiratory Status: Room air History of Recent Intubation: No Behavior/Cognition: Alert;Cooperative Oral Cavity Assessment: Within Functional Limits Oral Care Completed by SLP: No Oral Cavity - Dentition:  (may be missing several) Vision: Functional for self-feeding (although confirms diplopia intermittently) Self-Feeding Abilities: Able to feed self Patient Positioning: Upright in bed Baseline Vocal Quality: Normal Volitional Cough: Strong Volitional Swallow: Able to elicit    Oral/Motor/Sensory Function Overall Oral Motor/Sensory Function: Mild impairment Facial ROM: Reduced left;Suspected CN VII (facial) dysfunction Facial Symmetry: Abnormal symmetry left;Suspected CN VII (facial) dysfunction Facial Strength: Reduced left Facial Sensation: Within Functional Limits Lingual ROM:  (? decreased protrusion) Lingual Symmetry: Within Functional Limits Velum: Within Functional Limits Mandible: Within Functional Limits   Ice Chips Ice chips: Not tested   Thin Liquid Thin Liquid: Within functional limits Presentation: Cup;Straw    Nectar Thick Nectar Thick Liquid: Not tested   Honey Thick Honey Thick Liquid: Not tested   Puree Puree: Not tested   Solid   GO   Solid: Within functional limits    Functional  Assessment Tool Used: skilled clinical judgement Functional Limitations: Swallowing Swallow Current Status (Z6109): 0 percent impaired, limited or restricted Swallow Goal Status (U0454): 0 percent impaired, limited or restricted Swallow Discharge Status [phone]): 0  percent impaired, limited or restricted   Dean [name] 05/18/2016,3:09 PM  Dean [name].Ed [employer] [phone]

## 2016-05-18 NOTE — Consult Note (Signed)
Reason for Consult:   CAD noted on CT scan  Requesting Physician: Dr Benjamine Mola Primary Cardiologist Dr Ladona Ridgel  HPI:   45 y/o morbidly obese Caucasian male with a history of past WPW- s/p RFA, and history of PSVT and PAF treated with Amiodarone and on Xarelto. He has not had known CAD, angina, or prior cardiac stress testing. He last saw Dr Ladona Ridgel in July and was doing well on Amiodarone and Xarelto. Other problems include a history of HTN, DM, HLD, morbid obesity, smoking and COPD.   He was admitted 05/17/16 after he collapsed at home and had Lt sided weakness. The patient was drinking with friends and family at home last night ("perhaps 30 beers" per partner) when he fell off a stool he was balancing on and struck the back of his head.  Shortly after, he went in to go to bed, went into the bathroom and then came out telling his partner that he "didn't feel right" and had chest discomfort. He put down his cigarette, and told her that he felt nauseated, and then went to the bathroom where his partner heard him dry heaving. Shortly after he came out of the bathroom and fell on the ground, and when his partner reached him, he said he "couldn't get up" so she called 9-1-1. When EMS arrived they found him with left-sided weakness and transported to the ER as a code stroke.   He initially refused an MRI secondary to anxiety but Dr Roda Shutters has seen him and promised we could pre medicate him prior to this. His Lt sided weakness is still present but he feels it may be improved c/w admission. He was in NSR on admission and is in NSR now. He denies any chest pain but adm H&P did mention he complained of chest pain at home. He reports compliance with his medications and his partner confirmed this. His work up has included a chest  CTA which reported "coronary vascular calcification involving the LAD, advanced for the patient's age". Pt's last echo showed normal LVF July 2017.   PMHx:  Past Medical History:    Diagnosis Date  . Anxiety   . Asthma   . Atrial arrhythmia    multiples with 5-6 ablations  . Atrial fibrillation with rapid ventricular response (HCC)    With rates up to 200 as well as short PR tachycardia, cycle length of 240 ms.  . Chest tightness 05/08/14  . Diabetes mellitus, type II (HCC)   . Morbid obesity (HCC)   . Palpitations 05/08/14  . Snores    Wil occasional apnea. Had a remote sleep study but does not remember results.  . SOB (shortness of breath) 05/08/14  . SVT (supraventricular tachycardia) (HCC)   . WPW (Wolff-Parkinson-White syndrome)     Past Surgical History:  Procedure Laterality Date  . ABLATION    . WRIST SURGERY      SOCHx:  reports that he has been smoking.  He has never used smokeless tobacco. He reports that he drinks alcohol. He reports that he does not use drugs.  FAMHx: Family History  Problem Relation Age of Onset  . Clotting disorder Father   . Diabetes Father   . Heart attack Father 27  . Hypertension Father     ALLERGIES: Allergies  Allergen Reactions  . Prednisone Other (See Comments)    "goes crazy"    ROS: Review of Systems: General: negative for chills, fever, night sweats or  weight changes.  Cardiovascular: negative for chest pain, dyspnea on exertion, edema, orthopnea, palpitations, paroxysmal nocturnal dyspnea or shortness of breath HEENT: negative for any visual disturbances, blindness, glaucoma Dermatological: negative for rash Respiratory: negative for cough, hemoptysis, or wheezing Urologic: negative for hematuria or dysuria Abdominal: negative for nausea, vomiting, diarrhea, bright red blood per rectum, melena, or hematemesis Neurologic: negative for visual changes, syncope, or dizziness Musculoskeletal: negative for back pain, joint pain, or swelling Psych: cooperative and appropriate All other systems reviewed and are otherwise negative except as noted above.   HOME MEDICATIONS: Prior to Admission medications    Medication Sig Start Date End Date Taking? Authorizing Provider  alprazolam Prudy Feeler) 2 MG tablet Take 2 mg by mouth 2 (two) times daily.    Yes Historical Provider, MD  amiodarone (PACERONE) 200 MG tablet Take 1 tablet (200 mg total) by mouth daily. 03/02/16  Yes Marinus Maw, MD  budesonide-formoterol [facility]) 160-4.5 MCG/ACT inhaler Inhale 2 puffs into the lungs daily as needed (shortness of breath).    Yes Historical Provider, MD  cetirizine (ZYRTEC) 10 MG tablet Take 10 mg by mouth daily.   Yes Historical Provider, MD  diltiazem (CARDIZEM CD) 180 MG 24 hr capsule Take 180 mg by mouth daily.    Yes Historical Provider, MD  HYDROcodone-acetaminophen (NORCO/VICODIN) 5-325 MG tablet Take 1-2 tablets by mouth every 6 (six) hours as needed (pain).  08/16/15  Yes Historical Provider, MD  ipratropium (ATROVENT) 0.02 % nebulizer solution Take 250 mcg by nebulization every 4 (four) hours as needed. As needed for shortness of breath   Yes Historical Provider, MD  levalbuterol (XOPENEX HFA) 45 MCG/ACT inhaler Inhale 1-2 puffs into the lungs every 6 (six) hours as needed for wheezing or shortness of breath.   Yes Historical Provider, MD  metFORMIN (GLUCOPHAGE) 1000 MG tablet Take 1 tablet by mouth 2 (two) times daily. 08/17/15  Yes Historical Provider, MD  montelukast (SINGULAIR) 10 MG tablet Take 10 mg by mouth every morning.    Yes Historical Provider, MD  multivitamin-iron-minerals-folic acid (CENTRUM) chewable tablet Chew 1 tablet by mouth daily.   Yes Historical Provider, MD  pantoprazole (PROTONIX) 40 MG tablet Take 40 mg by mouth daily.  08/16/15 08/15/16 Yes Historical Provider, MD  rivaroxaban (XARELTO) 20 MG TABS tablet Take 20 mg by mouth every morning.  07/18/15  Yes Marinus Maw, MD    HOSPITAL MEDICATIONS: I have reviewed the patient's current medications.  VITALS: Blood pressure (!) 157/79, pulse 95, temperature 97.7 F (36.5 C), temperature source Oral, resp. rate 16, height 6\' 7"   (2.007 m), weight (!) 375 lb (170.1 kg), SpO2 93 %.  PHYSICAL EXAM: General appearance: alert, cooperative, no distress and morbidly obese Neck: no carotid bruit and no JVD Lungs: diffuse wheezing Heart: regular rate and rhythm Abdomen: obese, non tender Extremities: extremities normal, atraumatic, no cyanosis or edema Pulses: 2+ and symmetric Skin: Skin color, texture, turgor normal. No rashes or lesions Neurologic: Speech seems slightly slurred, LUE weakness compared with Rt  LABS: Results for orders placed or performed during the hospital encounter of 05/17/16 (from the past 24 hour(s))  Ethanol     Status: Abnormal   Collection Time: 05/17/16 11:34 PM  Result Value Ref Range   Alcohol, Ethyl (B) 200 (H) <5 mg/dL  Protime-INR     Status: None   Collection Time: 05/17/16 11:34 PM  Result Value Ref Range   Prothrombin Time 14.1 11.4 - 15.2 seconds   INR 1.09  APTT     Status: None   Collection Time: 05/17/16 11:34 PM  Result Value Ref Range   aPTT 34 24 - 36 seconds  CBC     Status: Abnormal   Collection Time: 05/17/16 11:34 PM  Result Value Ref Range   WBC 13.0 (H) 4.0 - 10.5 K/uL   RBC 5.39 4.22 - 5.81 MIL/uL   Hemoglobin 16.1 13.0 - 17.0 g/dL   HCT 69.6 29.5 - 28.4 %   MCV 89.1 78.0 - 100.0 fL   MCH 29.9 26.0 - 34.0 pg   MCHC 33.5 30.0 - 36.0 g/dL   RDW 13.2 44.0 - 10.2 %   Platelets 248 150 - 400 K/uL  Differential     Status: Abnormal   Collection Time: 05/17/16 11:34 PM  Result Value Ref Range   Neutrophils Relative % 67 %   Neutro Abs 8.6 (H) 1.7 - 7.7 K/uL   Lymphocytes Relative 25 %   Lymphs Abs 3.2 0.7 - 4.0 K/uL   Monocytes Relative 6 %   Monocytes Absolute 0.8 0.1 - 1.0 K/uL   Eosinophils Relative 2 %   Eosinophils Absolute 0.3 0.0 - 0.7 K/uL   Basophils Relative 0 %   Basophils Absolute 0.0 0.0 - 0.1 K/uL  Comprehensive metabolic panel     Status: Abnormal   Collection Time: 05/17/16 11:34 PM  Result Value Ref Range   Sodium 133 (L) 135 - 145  mmol/L   Potassium 3.9 3.5 - 5.1 mmol/L   Chloride 96 (L) 101 - 111 mmol/L   CO2 25 22 - 32 mmol/L   Glucose, Bld 260 (H) 65 - 99 mg/dL   BUN 7 6 - 20 mg/dL   Creatinine, Ser 7.25 0.61 - 1.24 mg/dL   Calcium 8.9 8.9 - 36.6 mg/dL   Total Protein 7.6 6.5 - 8.1 g/dL   Albumin 3.9 3.5 - 5.0 g/dL   AST 41 15 - 41 U/L   ALT 70 (H) 17 - 63 U/L   Alkaline Phosphatase 133 (H) 38 - 126 U/L   Total Bilirubin 0.4 0.3 - 1.2 mg/dL   GFR calc non Af Amer >60 >60 mL/min   GFR calc Af Amer >60 >60 mL/min   Anion gap 12 5 - 15  CBG monitoring, ED     Status: Abnormal   Collection Time: 05/17/16 11:35 PM  Result Value Ref Range   Glucose-Capillary 272 (H) 65 - 99 mg/dL  I-stat troponin, ED (not at [facility])     Status: None   Collection Time: 05/17/16 11:50 PM  Result Value Ref Range   Troponin i, poc 0.01 0.00 - 0.08 ng/mL   Comment 3          I-Stat Chem 8, ED  (not at [facility])     Status: Abnormal   Collection Time: 05/17/16 11:52 PM  Result Value Ref Range   Sodium 134 (L) 135 - 145 mmol/L   Potassium 3.9 3.5 - 5.1 mmol/L   Chloride 95 (L) 101 - 111 mmol/L   BUN 8 6 - 20 mg/dL   Creatinine, Ser 4.40 0.61 - 1.24 mg/dL   Glucose, Bld 347 (H) 65 - 99 mg/dL   Calcium, Ion 4.25 (L) 1.15 - 1.40 mmol/L   TCO2 25 0 - 100 mmol/L   Hemoglobin 18.0 (H) 13.0 - 17.0 g/dL   HCT 95.6 (H) 38.7 - 56.4 %  Urine rapid drug screen (hosp performed)not at [facility]     Status: None  Collection Time: 05/18/16 12:58 AM  Result Value Ref Range   Opiates NONE DETECTED NONE DETECTED   Cocaine NONE DETECTED NONE DETECTED   Benzodiazepines NONE DETECTED NONE DETECTED   Amphetamines NONE DETECTED NONE DETECTED   Tetrahydrocannabinol NONE DETECTED NONE DETECTED   Barbiturates NONE DETECTED NONE DETECTED  Urinalysis, Routine w reflex microscopic (not at [facility])     Status: Abnormal   Collection Time: 05/18/16 12:58 AM  Result Value Ref Range   Color, Urine YELLOW YELLOW   APPearance CLEAR CLEAR   Specific  Gravity, Urine <1.005 (L) 1.005 - 1.030   pH 5.5 5.0 - 8.0   Glucose, UA >1000 (A) NEGATIVE mg/dL   Hgb urine dipstick TRACE (A) NEGATIVE   Bilirubin Urine NEGATIVE NEGATIVE   Ketones, ur NEGATIVE NEGATIVE mg/dL   Protein, ur NEGATIVE NEGATIVE mg/dL   Nitrite NEGATIVE NEGATIVE   Leukocytes, UA NEGATIVE NEGATIVE  Urine microscopic-add on     Status: Abnormal   Collection Time: 05/18/16 12:58 AM  Result Value Ref Range   Squamous Epithelial / LPF 0-5 (A) NONE SEEN   WBC, UA 0-5 0 - 5 WBC/hpf   RBC / HPF 0-5 0 - 5 RBC/hpf   Bacteria, UA RARE (A) NONE SEEN  CBG monitoring, ED     Status: Abnormal   Collection Time: 05/18/16  3:59 AM  Result Value Ref Range   Glucose-Capillary 279 (H) 65 - 99 mg/dL  Lipid panel     Status: Abnormal   Collection Time: 05/18/16  4:05 AM  Result Value Ref Range   Cholesterol 196 0 - 200 mg/dL   Triglycerides 161 (H) <150 mg/dL   HDL 33 (L) >09 mg/dL   Total CHOL/HDL Ratio 5.9 RATIO   VLDL 65 (H) 0 - 40 mg/dL   LDL Cholesterol 98 0 - 99 mg/dL  Troponin I (q 6hr x 3)     Status: None   Collection Time: 05/18/16  4:05 AM  Result Value Ref Range   Troponin I <0.03 <0.03 ng/mL  Basic metabolic panel     Status: Abnormal   Collection Time: 05/18/16  4:05 AM  Result Value Ref Range   Sodium 137 135 - 145 mmol/L   Potassium 4.2 3.5 - 5.1 mmol/L   Chloride 100 (L) 101 - 111 mmol/L   CO2 24 22 - 32 mmol/L   Glucose, Bld 257 (H) 65 - 99 mg/dL   BUN 7 6 - 20 mg/dL   Creatinine, Ser 6.04 0.61 - 1.24 mg/dL   Calcium 8.9 8.9 - 54.0 mg/dL   GFR calc non Af Amer >60 >60 mL/min   GFR calc Af Amer >60 >60 mL/min   Anion gap 13 5 - 15  Glucose, capillary     Status: Abnormal   Collection Time: 05/18/16 11:57 AM  Result Value Ref Range   Glucose-Capillary 277 (H) 65 - 99 mg/dL   Comment 1 Notify RN    Comment 2 Document in Chart   Troponin I (q 6hr x 3)     Status: None   Collection Time: 05/18/16 12:59 PM  Result Value Ref Range   Troponin I <0.03 <0.03  ng/mL    EKG: NSR, QTc 477  IMAGING: Ct Angio Head W Or Wo Contrast  Result Date: 05/18/2016 CLINICAL DATA:  45 y/o  M; code stroke with right arm weakness. EXAM: CT ANGIOGRAPHY HEAD AND NECK TECHNIQUE: Multidetector CT imaging of the head and neck was performed using the standard protocol during bolus  administration of intravenous contrast. Multiplanar CT image reconstructions and MIPs were obtained to evaluate the vascular anatomy. Carotid stenosis measurements (when applicable) are obtained utilizing NASCET criteria, using the distal internal carotid diameter as the denominator. Manually triggered contrast bolus due to insufficient opacification of the aorta for automatic triggering. CONTRAST:  70 cc Isovue 370. COMPARISON:  05/17/2016 CT of the head. FINDINGS: CTA NECK FINDINGS Early venous phase acquisition. Suboptimal assessment for small aneurysm or stenosis. Aortic arch: Standard branching. Imaged portion shows no evidence of aneurysm or dissection. No significant stenosis of the major arch vessel origins. Right carotid system: Patent.  No high-grade stenosis. Left carotid system: Patent.  No high-grade stenosis. Vertebral arteries: Patent. Skeleton: Multilevel cervical spondylosis greatest from C5 through C7 with there are endplate degenerative changes and marginal osteophytes. No high-grade bony canal stenosis or foraminal narrowing. Other neck: None. Upper chest: Clear appear Review of the MIP images confirms the above findings CTA HEAD FINDINGS Early venous phase acquisition. Suboptimal assessment for small aneurysm or stenosis. Anterior circulation: Bilateral M1 and A1 segments are patent. Distal anterior and middle cerebral artery circulation appears symmetric. Internal carotid arteries are obscured at the level of the distal cavernous segment due to artifact from bone. Otherwise the internal carotid arteries are patent. Posterior circulation: Bilateral vertebral arteries, the basilar artery,  and bilateral posterior cerebral arteries are patent. Venous sinuses: As permitted by contrast timing, patent. Anatomic variants: No definite posterior communicating artery is identified, likely hypoplastic or absent. Review of the MIP images confirms the above findings IMPRESSION: Early venous phase acquisition. Suboptimal assessment for small aneurysm or stenosis. 1. Patent carotid and vertebral arteries of the neck. 2. Patent circle of Willis. Electronically Signed   By: Mitzi Hansen M.D.   On: 05/18/2016 01:05   Ct Angio Neck W Or Wo Contrast  Result Date: 05/18/2016 CLINICAL DATA:  45 y/o  M; code stroke with right arm weakness. EXAM: CT ANGIOGRAPHY HEAD AND NECK TECHNIQUE: Multidetector CT imaging of the head and neck was performed using the standard protocol during bolus administration of intravenous contrast. Multiplanar CT image reconstructions and MIPs were obtained to evaluate the vascular anatomy. Carotid stenosis measurements (when applicable) are obtained utilizing NASCET criteria, using the distal internal carotid diameter as the denominator. Manually triggered contrast bolus due to insufficient opacification of the aorta for automatic triggering. CONTRAST:  70 cc Isovue 370. COMPARISON:  05/17/2016 CT of the head. FINDINGS: CTA NECK FINDINGS Early venous phase acquisition. Suboptimal assessment for small aneurysm or stenosis. Aortic arch: Standard branching. Imaged portion shows no evidence of aneurysm or dissection. No significant stenosis of the major arch vessel origins. Right carotid system: Patent.  No high-grade stenosis. Left carotid system: Patent.  No high-grade stenosis. Vertebral arteries: Patent. Skeleton: Multilevel cervical spondylosis greatest from C5 through C7 with there are endplate degenerative changes and marginal osteophytes. No high-grade bony canal stenosis or foraminal narrowing. Other neck: None. Upper chest: Clear appear Review of the MIP images confirms the  above findings CTA HEAD FINDINGS Early venous phase acquisition. Suboptimal assessment for small aneurysm or stenosis. Anterior circulation: Bilateral M1 and A1 segments are patent. Distal anterior and middle cerebral artery circulation appears symmetric. Internal carotid arteries are obscured at the level of the distal cavernous segment due to artifact from bone. Otherwise the internal carotid arteries are patent. Posterior circulation: Bilateral vertebral arteries, the basilar artery, and bilateral posterior cerebral arteries are patent. Venous sinuses: As permitted by contrast timing, patent. Anatomic variants: No definite posterior  communicating artery is identified, likely hypoplastic or absent. Review of the MIP images confirms the above findings IMPRESSION: Early venous phase acquisition. Suboptimal assessment for small aneurysm or stenosis. 1. Patent carotid and vertebral arteries of the neck. 2. Patent circle of Willis. Electronically Signed   By: Mitzi HansenLance  Furusawa-Stratton M.D.   On: 05/18/2016 01:05   Ct Angio Chest/abd/pel For Dissection W And/or W/wo  Result Date: 05/18/2016 CLINICAL DATA:  45 year old male follow-up for code stroke with left-sided chest pain and right arm weakness. Evaluate for dissection. EXAM: CT ANGIOGRAPHY CHEST, ABDOMEN AND PELVIS TECHNIQUE: Multidetector CT imaging through the chest, abdomen and pelvis was performed using the standard protocol during bolus administration of intravenous contrast. Multiplanar reconstructed images and MIPs were obtained and reviewed to evaluate the vascular anatomy. CONTRAST:  100 cc Isovue 370 COMPARISON:  Chest radiograph dated 05/08/2014 FINDINGS: CTA CHEST and ABD  IMPRESSION: No acute intrathoracic, abdominal, or pelvic pathology. No CT evidence of aortic dissection or aneurysm. Coronary vascular calcification advanced for the patient's age. Cardiology consult/referral is advised. **An incidental finding of potential clinical significance  has been found. A 12 mm right upper lobe ground-glass nodule. Initial follow-up with CT at 6-12 months is recommended to confirm persistence. If persistent, repeat CT is recommended every 2 years until 5 years of stability has been established. This recommendation follows the consensus statement: Guidelines for Management of Incidental Pulmonary Nodules Detected on CT Images: From the Fleischner Society 2017; Radiology 2017; 284:228-243.** Electronically Signed   By: Elgie CollardArash  Radparvar M.D.   On: 05/18/2016 01:42   Ct Head Code Stroke W/o Cm  Result Date: 05/17/2016 CLINICAL DATA:  Code stroke.  Left-sided deficits and confusion. EXAM: CT HEAD WITHOUT CONTRAST  IMPRESSION: 1. No acute intracranial abnormality is identified. 2. ASPECTS is 10 These results were called by telephone at the time of interpretation on 05/17/2016 at 11:58 pm to Dr. Pincus LargeKrikpatrick, who verbally acknowledged these results. Electronically Signed   By: Mitzi HansenLance  Furusawa-Stratton M.D.   On: 05/17/2016 23:59    IMPRESSION: Principal Problem:   Stroke-like symptoms Active Problems:   Type 2 diabetes mellitus without complication, without long-term current use of insulin (HCC)   Paroxysmal atrial fibrillation (HCC)   Essential hypertension   COPD (chronic obstructive pulmonary disease) (HCC)   ETOH abuse   CAD-noted on chest CT   History of PSVT (paroxysmal supraventricular tachycardia)   WPW (Wolff-Parkinson-White syndrome)   Morbid obesity (HCC)   Long term current use of anticoagulant   Mixed hyperlipidemia   Smoker   Pulmonary nodule, right   RECOMMENDATION: I encouraged pt to go ahead with MRI. He reports compliance with Amiodarone and Xarelto and he has been in NSR since admission. ? HTN stroke vs presumed embolic stroke. No bleed on head CT. There was a question on admission if he could take POs, now apparently on PO meds- resume home Amiodarone and Diltiazem.   He has incidental CAD on CT but it is not clear he is  symptomatic from this. Troponin negative x 3, LVF was normal by echo July 2017. OP Myoview when appropriate.   Time Spent Directly with Patient: 50 minutes  Corine ShelterLuke Kilroy, [place]  [phone] beeper 05/18/2016, 3:29 PM   Patient seen and examined and history reviewed. Agree with above findings and plan. 45 yo WM with history of HTN, HL, tobacco abuse and Etoh abuse. Admitted with acute left sided weakness. Initial head CT showed no bleed. Patient has a history of WPW s/p ablation and history  of afib controlled on amiodarone. He has been on Xarelto and reports compliance. Currently no chest pain or palpitations. Exam is remarkable for left hemiparesis.  Ecg is normal  CT chest shows extensive coronary calcifications  Recommend: continued amiodarone. Statin therapy with recent CVA and documented CAD. Resume Xarelto when OK with Neurology. Recommend a [employer] as outpatient when recovered from CVA to assess risk with CAD.  Sufyaan Palma [place], MDFACC 05/18/2016 3:59 PM

## 2016-05-18 NOTE — ED Notes (Signed)
The pts family reports that he has an inhaler and  aneb machine at home but he uses xopenox instead of albuterol because it makes his heart beat too fast

## 2016-05-18 NOTE — ED Notes (Signed)
Pt sleeping  hhn given

## 2016-05-18 NOTE — ED Notes (Signed)
Dr kirkpatrick at  the bedside °

## 2016-05-18 NOTE — Consult Note (Signed)
Neurology Consultation Reason for Consult: Left-sided weakness Referring Physician: Madilyn [name], E  CC: Left sided weakness  History is obtained from: Patient, wife  HPI: Dean [name] is a 45 y.o. male who is normal up until 22:00 when he lost use of his left arm and leg. He has a history of atrial fibrillation and takes Xarelto she states he is still currently taking. On arrival, he had a severe left hemiparesis.   LKW: 10 PM tpa given?: no, anticoagulation    ROS: A 14 point ROS was performed and is negative except as noted in the HPI.   Past Medical History:  Diagnosis Date  . Anxiety   . Asthma   . Atrial arrhythmia    multiples with 5-6 ablations  . Atrial fibrillation with rapid ventricular response (HCC)    With rates up to 200 as well as short PR tachycardia, cycle length of 240 ms.  . Chest tightness 05/08/14  . Diabetes mellitus, type II (HCC)   . Morbid obesity (HCC)   . Palpitations 05/08/14  . Snores    Wil occasional apnea. Had a remote sleep study but does not remember results.  . SOB (shortness of breath) 05/08/14  . SVT (supraventricular tachycardia) (HCC)   . WPW (Wolff-Parkinson-White syndrome)      FH: no hx stroke   Social History:  reports that he has been smoking.  He has never used smokeless tobacco. He reports that he drinks alcohol. He reports that he does not use drugs.   Exam: Current vital signs: BP (!) 162/101   Pulse 83   Resp 19   Ht 6\' 7"  (2.007 m)   Wt (!) 170.1 kg (375 lb)   SpO2 97%   BMI 42.25 kg/m  Vital signs in last 24 hours: Pulse Rate:  [83] 83 (10/02 0100) Resp:  [18-19] 19 (10/02 0100) BP: (153-162)/(76-101) 162/101 (10/02 0100) SpO2:  [91 %-97 %] 97 % (10/02 0100) Weight:  [170.1 kg (375 lb)] 170.1 kg (375 lb) (10/02 0050)   Physical Exam  Constitutional: Appears well-developed and well-nourished.  Psych: Affect appropriate to situation Eyes: No scleral injection HENT: No OP obstrucion Head: Normocephalic.   Cardiovascular: Normal rate and regular rhythm.  Respiratory: Effort normal and breath sounds normal to anterior ascultation GI: Soft.  No distension. There is no tenderness.  Skin: WDI  Neuro: Mental Status: Patient is awake, alert, oriented to person, place, month, year, and situation. Patient is able to give a clear and coherent history. No signs of aphasia or neglect Cranial Nerves: II: Visual Fields are full. Pupils are equal, round, and reactive to light.   III,IV, VI: EOMI without ptosis or diploplia.  V: Facial sensation is decreased on the left.  VII: Facial movement is grossly symmetric VIII: hearing is intact to voice X: Uvula elevates symmetrically XI: Shoulder shrug is symmetric. XII: tongue is midline without atrophy or fasciculations.  Motor: Tone is normal. Bulk is normal. 5/5 strength was present on the right, he has 2/5 strength in the left arm and leg.  Sensory: Sensation is decreased on the left.  Cerebellar: FNF intact on the right.      I have reviewed labs in epic and the results pertinent to this consultation are: cmp - elevated glucose.   I have reviewed the images obtained:CTA head - no clear LVO  Impression: 45 year old male with a history of atrial fibrillation who presents with new onset left-sided weakness. He is not a candidate for IV TPA due to  anticoagulation and no LVO as a target for intra-arterial thrombectomy. He'll need to be admitted for physical therapy and secondary prevention.  Recommendations: 1. HgbA1c, fasting lipid panel 2. MRI, MRA  of the brain without contrast 3. Frequent neuro checks 4. Echocardiogram 5. Carotid dopplers 6. Prophylactic therapy-Antiplatelet med: Aspirin - dose 325mg  PO or 300mg  PR 7. Risk factor modification 8. Telemetry monitoring 9. PT consult, OT consult, Speech consult 10. please page stroke NP  Or  PA  Or MD   from 8am -4 pm starting 10/2 as this patient will be followed by the stroke team at this  point.   You can look them up on [email or web address]     Ritta Slot, MD [facility] [phone]  If 7pm- 7am, please page neurology on call as listed in AMION.

## 2016-05-18 NOTE — ED Notes (Signed)
CBG 279 

## 2016-05-18 NOTE — ED Notes (Signed)
Pt has remained NPO. Pt failed bedside swallow screen with night shift RN.

## 2016-05-18 NOTE — ED Notes (Signed)
The pt s swallow screen was eneded when he could not follow commands.  He has been drinking alcohol not sure if this  Accounts for his  Problems following directions.  He failed his swallow screen

## 2016-05-18 NOTE — ED Notes (Signed)
Nasal 02 at 2 added   For sats lower than 91% prior

## 2016-05-18 NOTE — ED Triage Notes (Signed)
[place] [facility] [place]  Headache for 2-3 days chest pain all day sl sob.  He \\lost  the use of his lt arm and lt leg approx 2000  He arrived [id number]  To c-t  2340  Back from c-t 0040  Alert oriented skin warm and dry.  C/o a headache chest pain  Sl nauseated intermittently

## 2016-05-19 ENCOUNTER — Other Ambulatory Visit (HOSPITAL_COMMUNITY): Payer: Medicare Other

## 2016-05-19 ENCOUNTER — Inpatient Hospital Stay (HOSPITAL_COMMUNITY): Payer: Medicare Other

## 2016-05-19 DIAGNOSIS — F1010 Alcohol abuse, uncomplicated: Secondary | ICD-10-CM

## 2016-05-19 DIAGNOSIS — Z7901 Long term (current) use of anticoagulants: Secondary | ICD-10-CM

## 2016-05-19 DIAGNOSIS — I63311 Cerebral infarction due to thrombosis of right middle cerebral artery: Secondary | ICD-10-CM

## 2016-05-19 DIAGNOSIS — R2990 Unspecified symptoms and signs involving the nervous system: Secondary | ICD-10-CM

## 2016-05-19 LAB — BASIC METABOLIC PANEL
Anion gap: 8 (ref 5–15)
BUN: 13 mg/dL (ref 6–20)
CO2: 26 mmol/L (ref 22–32)
Calcium: 8.9 mg/dL (ref 8.9–10.3)
Chloride: 101 mmol/L (ref 101–111)
Creatinine, Ser: 0.73 mg/dL (ref 0.61–1.24)
GFR calc Af Amer: >60 mL/min (ref >60)
GFR calc non Af Amer: >60 mL/min (ref >60)
Glucose, Bld: 275 mg/dL — ABNORMAL HIGH (ref 65–99)
Potassium: 4.8 mmol/L (ref 3.5–5.1)
Sodium: 135 mmol/L (ref 135–145)

## 2016-05-19 LAB — GLUCOSE, CAPILLARY
GLUCOSE-CAPILLARY: 226 mg/dL — AB (ref 65–99)
GLUCOSE-CAPILLARY: 264 mg/dL — AB (ref 65–99)
GLUCOSE-CAPILLARY: 263 mg/dL — AB (ref 65–99)
Glucose-Capillary: 275 mg/dL — ABNORMAL HIGH (ref 65–99)

## 2016-05-19 LAB — C DIFFICILE QUICK SCREEN W PCR REFLEX
C Diff antigen: NEGATIVE
C Diff interpretation: NOT DETECTED
C Diff toxin: NEGATIVE

## 2016-05-19 LAB — CBC
HCT: 46.6 % (ref 39.0–52.0)
Hemoglobin: 15.3 g/dL (ref 13.0–17.0)
MCH: 29.4 pg (ref 26.0–34.0)
MCHC: 32.8 g/dL (ref 30.0–36.0)
MCV: 89.6 fL (ref 78.0–100.0)
Platelets: 259 10*3/uL (ref 150–400)
RBC: 5.2 MIL/uL (ref 4.22–5.81)
RDW: 14.4 % (ref 11.5–15.5)
WBC: 12.8 10*3/uL — ABNORMAL HIGH (ref 4.0–10.5)

## 2016-05-19 LAB — HEMOGLOBIN A1C
HEMOGLOBIN A1C: 11.4 % — AB (ref 4.8–5.6)
Mean Plasma Glucose: 280 mg/dL

## 2016-05-19 MED ORDER — LORATADINE 10 MG PO TABS
10.0000 mg | ORAL_TABLET | Freq: Every day | ORAL | Status: DC | PRN
  Administered 2016-05-19: 10 mg via ORAL
  Filled 2016-05-19: qty 1

## 2016-05-19 MED ORDER — NICOTINE 14 MG/24HR TD PT24: 14.0000 mg | MEDICATED_PATCH | Freq: Every day | TRANSDERMAL | 0 refills | Status: DC

## 2016-05-19 MED ORDER — UNABLE TO FIND: 0 refills | Status: DC

## 2016-05-19 MED ORDER — ASPIRIN EC 81 MG PO TBEC: 81.0000 mg | DELAYED_RELEASE_TABLET | Freq: Every day | ORAL | Status: DC

## 2016-05-19 MED ORDER — ATORVASTATIN CALCIUM 20 MG PO TABS: 20.0000 mg | ORAL_TABLET | Freq: Every day | ORAL | 0 refills | Status: DC

## 2016-05-19 MED ORDER — INSULIN ASPART 100 UNIT/ML ~~LOC~~ SOLN
0.0000 [IU] | Freq: Three times a day (TID) | SUBCUTANEOUS | Status: DC
  Administered 2016-05-19 (×2): 8 [IU] via SUBCUTANEOUS

## 2016-05-19 MED ORDER — INSULIN ASPART 100 UNIT/ML ~~LOC~~ SOLN: 0.0000 [IU] | Freq: Every day | SUBCUTANEOUS | Status: DC

## 2016-05-19 MED ORDER — RIVAROXABAN 20 MG PO TABS: 20.0000 mg | ORAL_TABLET | Freq: Every day | ORAL | Status: DC

## 2016-05-19 NOTE — Progress Notes (Signed)
Inpatient Diabetes Program Recommendations  AACE/ADA: New Consensus Statement on Inpatient Glycemic Control (2015)  Target Ranges:  Prepandial:   less than 140 mg/dL      Peak postprandial:   less than 180 mg/dL (1-2 hours)      Critically ill patients:  140 - 180 mg/dL   Lab Results  Component Value Date   GLUCAP 275 (H) 05/19/2016   HGBA1C 9.5 (H) 07/18/2015   Results for Dean [name], Dean [name] (MRN [id number]) as of 05/19/2016 08:11  Ref. Range 05/18/2016 11:57 05/18/2016 17:51 05/18/2016 20:42 05/18/2016 23:51 05/19/2016 05:06  Glucose-Capillary Latest Ref Range: 65 - 99 mg/dL 784277 (H) 696343 (H) 295323 (H) 314 (H) 275 (H)    Diabetes history: Type 2 diabetes  Outpatient Diabetes medications: Metformin 1000 mg bid  Current orders for Inpatient glycemic control: Novolog moderate q 4h  Inpatient Diabetes Program Recommendations:  Please consider adding basal insulin Lantus 25 units daily- fasting blood sugar 275mg /dl.    Since the patient has a diet ordered, please consider changing Novolog correction to tid and add Novolog 0-5 units qhs.    A1C pending.   Susette RacerJulie Roran Wegner, RN, BA, MHA, CDE Diabetes Coordinator Inpatient Diabetes Program  [phone] (Team Pager) [phone] [facility] Office) 05/19/2016 8:12 AM

## 2016-05-19 NOTE — Progress Notes (Signed)
Discharge Note:  Patient alert and oriented X 4 and in no distress.  Patient given discharge instructions regarding signs and symptoms to report, medications, diet, activity, and upcoming appointments. He verbalized understanding of all instructions.  Peripheral IV discontinued.  Patient confirmed that he had all of his personal belongings. He was transported out via wheelchair by NT.

## 2016-05-19 NOTE — Progress Notes (Signed)
Physical Therapy Treatment Patient Details Name: Dean [name] MRN: [id number] DOB: 21-Jun-1971 Today's Date: 05/19/2016    History of Present Illness Pt adm with lt sided weakness and ETOH level of 200.  CT neg. Repeat CT pending. PMH - OA of bil hips, afib, HTN, anxiety, DM, Wolf Parkinson White Syndrome, morbid obesity    PT Comments    Patient required supervision/min guard for safe mobility. Pt more steady with SPC and reported having one at home. Pt continues to demo balance deficits and L side weakness and will benefit from further skilled PT services to maximize independence and safety with mobility.   Follow Up Recommendations  Outpatient PT     Equipment Recommendations  Other (comment) (To be assessed)    Recommendations for Other Services       Precautions / Restrictions Precautions Precautions: Fall Restrictions Weight Bearing Restrictions: No    Mobility  Bed Mobility Overal bed mobility: Modified Independent             General bed mobility comments: Incr time and effort  Transfers Overall transfer level: Needs assistance Equipment used: None Transfers: Sit to/from Stand Sit to Stand: Supervision         General transfer comment: supervision for safety; pt unsteady upon standing but no LOB  Ambulation/Gait Ambulation/Gait assistance: Min guard Ambulation Distance (Feet): 150 Feet Assistive device: Straight cane Gait Pattern/deviations: Step-through pattern;Decreased step length - left;Decreased stance time - left;Wide base of support Gait velocity: decr   General Gait Details: min guard for safety; pt more steady with SPC and reported he uses one at home when his L hip is bothering him; cues for safe use of AD and sequencing   Stairs            Wheelchair Mobility    Modified Rankin (Stroke Patients Only) Modified Rankin (Stroke Patients Only) Pre-Morbid Rankin Score: No symptoms Modified Rankin: Moderately severe disability      Balance Overall balance assessment: Needs assistance Sitting-balance support: No upper extremity supported;Feet supported Sitting balance-Leahy Scale: Good     Standing balance support: No upper extremity supported;During functional activity Standing balance-Leahy Scale: Fair Standing balance comment: pt unable to tandem stance or single leg stance with L LE and very briefly able to perform tandem stance and single leg stance with R LE                    Cognition Arousal/Alertness: Awake/alert Behavior During Therapy: Flat affect Overall Cognitive Status: Impaired/Different from baseline Area of Impairment: Problem solving             Problem Solving: Slow processing General Comments: Pt reports he just feels a little slow.    Exercises Other Exercises Other Exercises: Gave pt red putty and handout for squence of activities to do with it and I went over this with him. As well as another handout with FM and GM activities on it and I went over these with him. Made pt aware he needs to use, use, use his left upper extremity to get function back more readily.    General Comments        Pertinent Vitals/Pain Pain Assessment: No/denies pain    Home Living Family/patient expects to be discharged to:: Private residence Living Arrangements: Spouse/significant other;Children Available Help at Discharge: Family;Available 24 hours/day Type of Home: House Home Access: Stairs to enter Entrance Stairs-Rails: Right;Left Home Layout: One level Home Equipment: None      Prior Function Level of Independence:  Independent      Comments: Works in [employer] business   PT Goals (current goals can now be found in the care plan section) Acute Rehab PT Goals Patient Stated Goal: return home Progress towards PT goals: Progressing toward goals    Frequency    Min 4X/week      PT Plan Current plan remains appropriate    Co-evaluation             End of Session  Equipment Utilized During Treatment: Gait belt Activity Tolerance: Patient tolerated treatment well Patient left: with call bell/phone within reach;with family/visitor present;in chair     Time: [id number] PT Time Calculation (min) (ACUTE ONLY): 26 min  Charges:  $Gait Training: 8-22 mins $Therapeutic Activity: 8-22 mins                    G Codes:      Derek MoundKellyn R Laporchia Nakajima Clemma Johnsen, PTA Pager: [phone]   05/19/2016, 1:41 PM

## 2016-05-19 NOTE — Progress Notes (Signed)
STROKE TEAM PROGRESS NOTE   SUBJECTIVE (INTERVAL HISTORY) His wife and mom are at the bedside.  Overall he feels his condition is improving. He still refused MRI and MRA. Will repeat CT head.     OBJECTIVE Temp:  [97.9 F (36.6 C)-98.2 F (36.8 C)] 98.1 F (36.7 C) (10/03 1414) Pulse Rate:  [71-91] 71 (10/03 1414) Cardiac Rhythm: Normal sinus rhythm (10/03 0755) Resp:  [16-19] 16 (10/03 1414) BP: (150-163)/(80-92) 155/92 (10/03 1414) SpO2:  [93 %-97 %] 96 % (10/03 1414)   Recent Labs Lab 05/18/16 2042 05/18/16 2351 05/19/16 0506 05/19/16 0753 05/19/16 1214  GLUCAP 323* 314* 275* 226* 264*    Recent Labs Lab 05/17/16 2334 05/17/16 2352 05/18/16 0405  NA 133* 134* 137  K 3.9 3.9 4.2  CL 96* 95* 100*  CO2 25  --  24  GLUCOSE 260* 260* 257*  BUN 7 8 7   CREATININE 0.96 1.20 0.90  CALCIUM 8.9  --  8.9    Recent Labs Lab 05/17/16 2334  AST 41  ALT 70*  ALKPHOS 133*  BILITOT 0.4  PROT 7.6  ALBUMIN 3.9    Recent Labs Lab 05/17/16 2334 05/17/16 2352 05/19/16 1318  WBC 13.0*  --  12.8*  NEUTROABS 8.6*  --   --   HGB 16.1 18.0* 15.3  HCT 48.0 53.0* 46.6  MCV 89.1  --  89.6  PLT 248  --  259    Recent Labs Lab 05/18/16 0405 05/18/16 1259  TROPONINI <0.03 <0.03    Recent Labs  05/17/16 2334  LABPROT 14.1  INR 1.09    Recent Labs  05/18/16 0058  COLORURINE YELLOW  LABSPEC <1.005*  PHURINE 5.5  GLUCOSEU >1000*  HGBUR TRACE*  BILIRUBINUR NEGATIVE  KETONESUR NEGATIVE  PROTEINUR NEGATIVE  NITRITE NEGATIVE  LEUKOCYTESUR NEGATIVE       Component Value Date/Time   CHOL 196 05/18/2016 0405   TRIG 324 (H) 05/18/2016 0405   HDL 33 (L) 05/18/2016 0405   CHOLHDL 5.9 05/18/2016 0405   VLDL 65 (H) 05/18/2016 0405   LDLCALC 98 05/18/2016 0405   Lab Results  Component Value Date   HGBA1C 11.4 (H) 05/18/2016      Component Value Date/Time   LABOPIA NONE DETECTED 05/18/2016 0058   COCAINSCRNUR NONE DETECTED 05/18/2016 0058   LABBENZ NONE  DETECTED 05/18/2016 0058   AMPHETMU NONE DETECTED 05/18/2016 0058   THCU NONE DETECTED 05/18/2016 0058   LABBARB NONE DETECTED 05/18/2016 0058     Recent Labs Lab 05/17/16 2334  ETH 200*    I have personally reviewed the radiological images below and agree with the radiology interpretations.  Ct Angio Head and neck W Or Wo Contrast 05/18/2016 IMPRESSION: Early venous phase acquisition. Suboptimal assessment for small aneurysm or stenosis. 1. Patent carotid and vertebral arteries of the neck. 2. Patent circle of Willis.   Ct Angio Chest/abd/pel For Dissection W And/or W/wo 05/18/2016 IMPRESSION: No acute intrathoracic, abdominal, or pelvic pathology. No CT evidence of aortic dissection or aneurysm. Coronary vascular calcification advanced for the patient's age. Cardiology consult/referral is advised. An incidental finding of potential clinical significance has been found. A 12 mm right upper lobe ground-glass nodule. Initial follow-up with CT at 6-12 months is recommended to confirm persistence. If persistent, repeat CT is recommended every 2 years until 5 years of stability has been established. This recommendation follows the consensus statement: Guidelines for Management of Incidental Pulmonary Nodules Detected on CT Images: From the Fleischner Society 2017; Radiology 2017;  [id number]:228-243.  Ct Head Code Stroke W/o Cm 05/17/2016 IMPRESSION: 1. No acute intracranial abnormality is identified. 2. ASPECTS is 10   Ct Head Wo Contrast 05/19/2016 IMPRESSION: Normal head CT     2D Echocardiogram  pending   PHYSICAL EXAM  Temp:  [97.9 F (36.6 C)-98.2 F (36.8 C)] 98.1 F (36.7 C) (10/03 1414) Pulse Rate:  [71-91] 71 (10/03 1414) Resp:  [16-19] 16 (10/03 1414) BP: (150-163)/(80-92) 155/92 (10/03 1414) SpO2:  [93 %-97 %] 96 % (10/03 1414)  General - morbid obesity, well developed, in no apparent distress, depressed mood.  Ophthalmologic - fundi not visualized due to  noncooperation.  Cardiovascular - Regular rate and rhythm with no murmur, not in afib rhythm.  Neck - supple, no carotid bruits  Mental Status -  Level of arousal and orientation to time, place, and person were intact. Language including expression, naming, repetition, comprehension was assessed and found intact. Mildly dyarthria. Fund of Knowledge was assessed and was intact.  Cranial Nerves II - XII - II - Visual field intact OU. III, IV, VI - Extraocular movements intact. V - Facial sensation decreased on the left, 70% of the right. VII - Facial movement intact bilaterally. VIII - Hearing & vestibular intact bilaterally. X - Palate elevates symmetrically, mildly dyarthric. XI - Chin turning & shoulder shrug intact bilaterally. XII - Tongue protrusion intact.  Motor Strength - The patient's strength was 5/5 RUE and RLE, but 3+/5 LUE and 3/5 LLE proximal and 2/5 DF/PF.  Bulk was normal and fasciculations were absent.   Motor Tone - Muscle tone was assessed at the neck and appendages and was normal.  Reflexes - The patient's reflexes were symmetrical in all extremities and he had no pathological reflexes.  Sensory - Light touch, temperature/pinprick were assessed and were decreased on the left, about 70% of the right.    Coordination - The patient had normal movements in the right hand with no ataxia or dysmetria.  Tremor was absent.  Gait and Station - not tested due to weakness.   ASSESSMENT/PLAN Dean [name] is a 45 y.o. 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 Symptoms stable.    Stroke:  Suspect right subcortical infarct likely small vessel disease due to uncontrolled stroke risk factors of DM, HTN, smoker, morbid obesity, ? OSA. However, cardioembolic due to pAfib are still in DDx.  MRI / MRA - pt refused  CT repeat - normal  CTA  head and neck - negative   2D Echo  pending  LDL 98  HgbA1c 11.4  lovenox for VTE prophylaxis  Diet heart healthy/carb modified Room service appropriate? Yes; Fluid consistency: Thin   Xarelto (rivaroxaban) daily prior to admission, now on aspirin 325 mg daily. Due to likely small right subcortical infarct, will resume Xarelto for stroke prevention but will also add ASA 81mg  daily for further stroke prevention  Patient counseled to be compliant with his antithrombotic medications  Ongoing aggressive stroke risk factor management  Therapy recommendations:  Pending   Disposition:  Pending  pAfib s/p ablation  On amiodarone  On Xarelto - pt stated compliance  Currently not in afib rhythm  Follows up with Dr. 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  Stable  Hyperlipidemia  Home meds:  none   LDL 98, goal < 70  Add lipitor 20mg   Continue statin at discharge  Tobacco abuse  Current smoker  Smoking cessation counseling provided  Pt is willing to quit  Morbid obesity  BMI 42.25  Recommend weight loss program  Healthy diet and regular exercise  Other Stroke Risk Factors  Coronary artery disease - shown on CTA - cardiology on board - outpt stress test - 2D pending  Likely Obstructive sleep apnea - need outpt sleepy study  Other Active Problems  High TG  Other Pertinent History  Leukocytosis - mild  Hospital day # 1  Neurology will sign off. Please call with  questions. Pt will follow up with Dr. Roda Shutters at [facility] in about 6 weeks. Thanks for the consult.  Marvel Plan, MD PhD Stroke Neurology 05/19/2016 2:15 PM    To contact Stroke Continuity provider, please refer to [email or web address]. After hours, contact General Neurology

## 2016-05-19 NOTE — Progress Notes (Signed)
SECOND ATTEMPT TO GET PT DOWN FOR MRI AND PT REFUSED BOTH TIMES

## 2016-05-19 NOTE — Discharge Summary (Signed)
Physician Discharge Summary  Dean [name] ZOX:[id number] DOB: March 02, 1971 DOA: 05/17/2016  PCP: Mickle Plumb, NP  Admit date: 05/17/2016 Discharge date: 05/19/2016   Recommendations for Outpatient Follow-Up:   follow up with Dr. Roda Shutters at [facility] in about 6 weeks Outpatient stress test outpatient sleep study -needs tighter diabetic control- refused insulin Outpatient PT referral   Discharge Diagnosis:   Principal Problem:   Stroke-like symptoms Active Problems:   History of PSVT (paroxysmal supraventricular tachycardia)   WPW (Wolff-Parkinson-White syndrome)   Type 2 diabetes mellitus without complication, without long-term current use of insulin (HCC)   Paroxysmal atrial fibrillation (HCC)   Morbid obesity (HCC)   Essential hypertension   Long term current use of anticoagulant   Mixed hyperlipidemia   Smoker   COPD (chronic obstructive pulmonary disease) (HCC)   ETOH abuse   Pulmonary nodule, right   CAD-noted on chest CT   Family history of coronary artery disease in father   Discharge disposition:  Home.  Discharge Condition: Improved.  Diet recommendation: Low sodium, heart healthy.  Carbohydrate-modified  Wound care: None.   History of Present Illness:   Dean [name] is a 45 y.o. male with a past medical history significant for atrial fibrillation on Xarelto, WPW s/p numerous ablations, and HTN who presents with left sided weakness and intoxication.  The patient was drinking with friends and family at home tonight ("perhaps 30 beers" per partner) when he fell off a stool he was balancing on and struck the back of his head.    Shortly after, he went in to go to bed, went into the bathroom and then came out telling his partner that he "didn't feel right" and had chest discomfort. He put down his cigarette, and told her that he felt nauseated, and then went to the bathroom where his partner heard him dry heaving. Shortly after he came out of the bathroom and fell on the  ground, and when his partner reached him, he said he "couldn't get up" so she called 9-1-1. When EMS arrived they found him with left-sided weakness and transported to the ER as a code stroke.   Hospital Course by Problem:   Suspect right subcortical infarct likely small vessel disease due to uncontrolled stroke risk factors of DM, HTN, smoker, morbid obesity, ? OSA.  MRI / MRA - pt refused  CT repeat - normal  CTA head and neck - negative   2D Echo  done in July as outaptient  LDL 98  HgbA1c 11.4  Xarelto (rivaroxaban) daily prior to admission, now on aspirin 325 mg daily. 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  Hyperlipidemia  Home meds:  none   LDL 98, goal < 70  Add lipitor 20mg - outpatient LFTs and FLP in 6 weeks  Tobacco abuse  Current smoker  Smoking cessation counseling provided  Morbid obesity  BMI 42.25  Recommend weight loss program  Healthy diet and regular exercise  Other Stroke Risk Factors  Coronary artery disease - shown on CTA - cardiology on board - outpt stress test   Likely Obstructive sleep apnea - need outpt sleepy study    Medical Consultants:    Neurology   Discharge Exam:   Vitals:    05/19/16 1414 05/19/16 1436  BP: (!) 155/92   Pulse: 71 70  Resp: 16 15  Temp: 98.1 F (36.7 C)    Vitals:   05/19/16 0100 05/19/16 1040 05/19/16 1414 05/19/16 1436  BP: (!) 150/81 (!) 163/89 (!) 155/92   Pulse: 86 72 71 70  Resp: 18 18 16 15   Temp: 98 F (36.7 C) 98.2 F (36.8 C) 98.1 F (36.7 C)   TempSrc: Axillary Oral Oral   SpO2: 97% 93% 96%   Weight:      Height:        Gen:  NAD-- poor eye contact  The results of significant diagnostics from this hospitalization (including imaging, microbiology, ancillary and laboratory) are listed below for reference.     Procedures and Diagnostic Studies:   Ct Angio Head W Or Wo Contrast  Result Date: 05/18/2016 CLINICAL DATA:  45 y/o  M; code stroke with right arm weakness. EXAM: CT ANGIOGRAPHY HEAD AND NECK TECHNIQUE: Multidetector CT imaging of the head and neck was performed using the standard protocol during bolus administration of intravenous contrast. Multiplanar CT image reconstructions and MIPs were obtained to evaluate the vascular anatomy. Carotid stenosis measurements (when applicable) are obtained utilizing NASCET criteria, using the distal internal carotid diameter as the denominator. Manually triggered contrast bolus due to insufficient opacification of the aorta for automatic triggering. CONTRAST:  70 cc Isovue 370. COMPARISON:  05/17/2016 CT of the head. FINDINGS: CTA NECK FINDINGS Early venous phase acquisition. Suboptimal assessment for small aneurysm or stenosis. Aortic arch: Standard branching. Imaged portion shows no evidence of aneurysm or dissection. No significant stenosis of the major arch vessel origins. Right carotid system: Patent.  No high-grade stenosis. Left carotid system: Patent.  No high-grade stenosis. Vertebral arteries: Patent. Skeleton: Multilevel cervical spondylosis greatest from C5 through C7 with there are endplate degenerative changes and marginal osteophytes. No high-grade bony canal stenosis or  foraminal narrowing. Other neck: None. Upper chest: Clear appear Review of the MIP images confirms the above findings CTA HEAD FINDINGS Early venous phase acquisition. Suboptimal assessment for small aneurysm or stenosis. Anterior circulation: Bilateral M1 and A1 segments are patent. Distal anterior and middle cerebral artery circulation appears symmetric. Internal carotid arteries are obscured at the level of the distal cavernous segment due to artifact from bone. Otherwise the internal carotid arteries are patent. Posterior circulation: Bilateral vertebral arteries, the basilar artery, and bilateral posterior cerebral arteries are patent. Venous sinuses: As permitted by contrast timing, patent. Anatomic variants: No definite posterior communicating artery is identified, likely hypoplastic or absent. Review of the MIP images confirms the above findings IMPRESSION: Early venous phase acquisition. Suboptimal assessment for small aneurysm or stenosis. 1. Patent carotid and vertebral arteries of the neck. 2. Patent circle of Willis. Electronically Signed   By: Mitzi Hansen M.D.   On: 05/18/2016 01:05   Ct Angio Neck W Or Wo Contrast  Result Date: 05/18/2016 CLINICAL DATA:  45 y/o  M; code stroke with right arm weakness. EXAM: CT ANGIOGRAPHY HEAD AND NECK TECHNIQUE: Multidetector CT  imaging of the head and neck was performed using the standard protocol during bolus administration of intravenous contrast. Multiplanar CT image reconstructions and MIPs were obtained to evaluate the vascular anatomy. Carotid stenosis measurements (when applicable) are obtained utilizing NASCET criteria, using the distal internal carotid diameter as the denominator. Manually triggered contrast bolus due to insufficient opacification of the aorta for automatic triggering. CONTRAST:  70 cc Isovue 370. COMPARISON:  05/17/2016 CT of the head. FINDINGS: CTA NECK FINDINGS Early venous phase acquisition. Suboptimal assessment for  small aneurysm or stenosis. Aortic arch: Standard branching. Imaged portion shows no evidence of aneurysm or dissection. No significant stenosis of the major arch vessel origins. Right carotid system: Patent.  No high-grade stenosis. Left carotid system: Patent.  No high-grade stenosis. Vertebral arteries: Patent. Skeleton: Multilevel cervical spondylosis greatest from C5 through C7 with there are endplate degenerative changes and marginal osteophytes. No high-grade bony canal stenosis or foraminal narrowing. Other neck: None. Upper chest: Clear appear Review of the MIP images confirms the above findings CTA HEAD FINDINGS Early venous phase acquisition. Suboptimal assessment for small aneurysm or stenosis. Anterior circulation: Bilateral M1 and A1 segments are patent. Distal anterior and middle cerebral artery circulation appears symmetric. Internal carotid arteries are obscured at the level of the distal cavernous segment due to artifact from bone. Otherwise the internal carotid arteries are patent. Posterior circulation: Bilateral vertebral arteries, the basilar artery, and bilateral posterior cerebral arteries are patent. Venous sinuses: As permitted by contrast timing, patent. Anatomic variants: No definite posterior communicating artery is identified, likely hypoplastic or absent. Review of the MIP images confirms the above findings IMPRESSION: Early venous phase acquisition. Suboptimal assessment for small aneurysm or stenosis. 1. Patent carotid and vertebral arteries of the neck. 2. Patent circle of Willis. Electronically Signed   By: Mitzi HansenLance  Furusawa-Stratton M.D.   On: 05/18/2016 01:05   Ct Angio Chest/abd/pel For Dissection W And/or W/wo  Result Date: 05/18/2016 CLINICAL DATA:  45 year old male follow-up for code stroke with left-sided chest pain and right arm weakness. Evaluate for dissection. EXAM: CT ANGIOGRAPHY CHEST, ABDOMEN AND PELVIS TECHNIQUE: Multidetector CT imaging through the chest, abdomen  and pelvis was performed using the standard protocol during bolus administration of intravenous contrast. Multiplanar reconstructed images and MIPs were obtained and reviewed to evaluate the vascular anatomy. CONTRAST:  100 cc Isovue 370 COMPARISON:  Chest radiograph dated 05/08/2014 FINDINGS: CTA CHEST FINDINGS Cardiovascular: The thoracic aorta appears unremarkable. There is no aneurysmal dilatation or evidence of dissection. The origins of the great vessels of the aortic arch appear patent. The main pulmonary trunk is patent. There is no cardiomegaly or pericardial effusion. There is coronary vascular calcification involving the LAD, advanced for the patient's age. Cardiology consult/referral is advised. Mediastinum/Nodes: There is no hilar or mediastinal adenopathy. The esophagus is grossly unremarkable. No thyroid nodules identified. Lungs/Pleura: There is a 12 mm ground-glass nodular density in the right upper lobe (series 19 image 36). The lungs are otherwise clear. There is no pleural effusion or pneumothorax. The central airways are patent. Musculoskeletal: There is no axillary adenopathy. The chest wall soft tissues appear unremarkable. The osseous structures are intact. Review of the MIP images confirms the above findings. CTA ABDOMEN AND PELVIS FINDINGS VASCULAR Aorta: Normal caliber aorta without aneurysm, dissection, vasculitis or significant stenosis. Celiac: Patent without evidence of aneurysm, dissection, vasculitis or significant stenosis. SMA: Patent without evidence of aneurysm, dissection, vasculitis or significant stenosis. Renals: Both renal arteries are patent without evidence of aneurysm, dissection, vasculitis, fibromuscular  dysplasia or significant stenosis. IMA: Patent without evidence of aneurysm, dissection, vasculitis or significant stenosis. Veins: No obvious venous abnormality within the limitations of this arterial phase study. Review of the MIP images confirms the above findings.  NON-VASCULAR Hepatobiliary: Diffuse fatty infiltration of the liver. No intrahepatic biliary ductal dilatation. The gallbladder is predominantly collapsed. Pancreas: Unremarkable. No pancreatic ductal dilatation or surrounding inflammatory changes. Spleen: Normal in size without focal abnormality. Adrenals/Urinary Tract: Adrenal glands are unremarkable. Kidneys are normal, without renal calculi, focal lesion, or hydronephrosis. Bladder is unremarkable. Stomach/Bowel: Moderate stool throughout the colon. No evidence of bowel obstruction or active inflammation. Normal appendix. Lymphatic: No lymphadenopathy. Reproductive: The prostate and seminal vesicles are grossly unremarkable. Other: Small fat containing umbilical hernia. Musculoskeletal: Right L4 pars defect. No listhesis. L5-S1 disc desiccation with vacuum phenomena. No acute fracture. Review of the MIP images confirms the above findings. IMPRESSION: No acute intrathoracic, abdominal, or pelvic pathology. No CT evidence of aortic dissection or aneurysm. Coronary vascular calcification advanced for the patient's age. Cardiology consult/referral is advised. **An incidental finding of potential clinical significance has been found. A 12 mm right upper lobe ground-glass nodule. Initial follow-up with CT at 6-12 months is recommended to confirm persistence. If persistent, repeat CT is recommended every 2 years until 5 years of stability has been established. This recommendation follows the consensus statement: Guidelines for Management of Incidental Pulmonary Nodules Detected on CT Images: From the Fleischner Society 2017; Radiology 2017; 284:228-243.** Electronically Signed   By: Elgie Collard M.D.   On: 05/18/2016 01:42   Ct Head Code Stroke W/o Cm  Result Date: 05/17/2016 CLINICAL DATA:  Code stroke.  Left-sided deficits and confusion. EXAM: CT HEAD WITHOUT CONTRAST TECHNIQUE: Contiguous axial images were obtained from the base of the skull through the  vertex without intravenous contrast. COMPARISON:  None. FINDINGS: Brain: No evidence of acute infarction, hemorrhage, hydrocephalus, extra-axial collection or mass lesion/mass effect. Motion and streak artifact partially obscuring temporal lobes and cerebellum. Vascular: No hyperdense vessel or unexpected calcification. Skull: Normal. Negative for fracture or focal lesion. Sinuses/Orbits: Mild ethmoid sinus mucosal thickening. Orbits are unremarkable. Other: None. ASPECTS [facility] Stroke Program Early CT Score) - Ganglionic level infarction (caudate, lentiform nuclei, internal capsule, insula, M1-M3 cortex): 7 - Supraganglionic infarction (M4-M6 cortex): 3 Total score (0-10 with 10 being normal): 10 IMPRESSION: 1. No acute intracranial abnormality is identified. 2. ASPECTS is 10 These results were called by telephone at the time of interpretation on 05/17/2016 at 11:58 pm to Dr. Pincus Large, who verbally acknowledged these results. Electronically Signed   By: Mitzi Hansen M.D.   On: 05/17/2016 23:59     Labs:   Basic Metabolic Panel:  Recent Labs Lab 05/17/16 2334 05/17/16 2352 05/18/16 0405 05/19/16 1318  NA 133* 134* 137 135  K 3.9 3.9 4.2 4.8  CL 96* 95* 100* 101  CO2 25  --  24 26  GLUCOSE 260* 260* 257* 275*  BUN 7 8 7 13   CREATININE 0.96 1.20 0.90 0.73  CALCIUM 8.9  --  8.9 8.9   GFR Estimated Creatinine Clearance: 209.3 mL/min (by C-G formula based on SCr of 0.73 mg/dL). Liver Function Tests:  Recent Labs Lab 05/17/16 2334  AST 41  ALT 70*  ALKPHOS 133*  BILITOT 0.4  PROT 7.6  ALBUMIN 3.9   No results for input(s): LIPASE, AMYLASE in the last 168 hours. No results for input(s): AMMONIA in the last 168 hours. Coagulation profile  Recent Labs Lab 05/17/16 2334  INR  1.09    CBC:  Recent Labs Lab 05/17/16 2334 05/17/16 2352 05/19/16 1318  WBC 13.0*  --  12.8*  NEUTROABS 8.6*  --   --   HGB 16.1 18.0* 15.3  HCT 48.0 53.0* 46.6  MCV 89.1  --  89.6   PLT 248  --  259   Cardiac Enzymes:  Recent Labs Lab 05/18/16 0405 05/18/16 1259  TROPONINI <0.03 <0.03   BNP: Invalid input(s): POCBNP CBG:  Recent Labs Lab 05/18/16 2042 05/18/16 2351 05/19/16 0506 05/19/16 0753 05/19/16 1214  GLUCAP 323* 314* 275* 226* 264*   D-Dimer No results for input(s): DDIMER in the last 72 hours. Hgb A1c  Recent Labs  05/18/16 0405  HGBA1C 11.4*   Lipid Profile  Recent Labs  05/18/16 0405  CHOL 196  HDL 33*  LDLCALC 98  TRIG 409*  CHOLHDL 5.9   Thyroid function studies No results for input(s): TSH, T4TOTAL, T3FREE, THYROIDAB in the last 72 hours.  Invalid input(s): FREET3 Anemia work up No results for input(s): VITAMINB12, FOLATE, FERRITIN, TIBC, IRON, RETICCTPCT in the last 72 hours. Microbiology Recent Results (from the past 240 hour(s))  C difficile quick scan w PCR reflex     Status: None   Collection Time: 05/19/16  9:01 AM  Result Value Ref Range Status   C Diff antigen NEGATIVE NEGATIVE Final   C Diff toxin NEGATIVE NEGATIVE Final   C Diff interpretation No C. difficile detected.  Final     Discharge Instructions:   Discharge Instructions    Ambulatory referral to Neurology    Complete by:  As directed    Pt will follow up with Dr. Roda Shutters at [facility] in about 6 weeks. Thanks.   Diet - low sodium heart healthy    Complete by:  As directed    Diet Carb Modified    Complete by:  As directed    Discharge instructions    Complete by:  As directed    outpt stress test  outpt sleepy study Lifestyle changes: stop smoking, diabetic diet, avoid excessive alcohol   Increase activity slowly    Complete by:  As directed        Medication List    TAKE these medications   alprazolam 2 MG tablet Commonly known as:  XANAX Take 2 mg by mouth 2 (two) times daily.   amiodarone 200 MG tablet Commonly known as:  PACERONE Take 1 tablet (200 mg total) by mouth daily.   aspirin EC 81 MG tablet Take 1 tablet (81 mg total) by  mouth daily.   atorvastatin 20 MG tablet Commonly known as:  LIPITOR Take 1 tablet (20 mg total) by mouth daily at 6 PM.   budesonide-formoterol 160-4.5 MCG/ACT inhaler Commonly known as:  SYMBICORT Inhale 2 puffs into the lungs daily as needed (shortness of breath).   cetirizine 10 MG tablet Commonly known as:  ZYRTEC Take 10 mg by mouth daily.   diltiazem 180 MG 24 hr capsule Commonly known as:  CARDIZEM CD Take 180 mg by mouth daily.   HYDROcodone-acetaminophen 5-325 MG tablet Commonly known as:  NORCO/VICODIN Take 1-2 tablets by mouth every 6 (six) hours as needed (pain).   ipratropium 0.02 % nebulizer solution Commonly known as:  ATROVENT Take 250 mcg by nebulization every 4 (four) hours as needed. As needed for shortness of breath   levalbuterol 45 MCG/ACT inhaler Commonly known as:  XOPENEX HFA Inhale 1-2 puffs into the lungs every 6 (six) hours as needed  for wheezing or shortness of breath.   metFORMIN 1000 MG tablet Commonly known as:  GLUCOPHAGE Take 1 tablet by mouth 2 (two) times daily.   montelukast 10 MG tablet Commonly known as:  SINGULAIR Take 10 mg by mouth every morning.   multivitamin-iron-minerals-folic acid chewable tablet Chew 1 tablet by mouth daily.   nicotine 14 mg/24hr patch Commonly known as:  NICODERM CQ - dosed in mg/24 hours Place 1 patch (14 mg total) onto the skin daily. Start taking on:  05/20/2016   pantoprazole 40 MG tablet Commonly known as:  PROTONIX Take 40 mg by mouth daily.   XARELTO 20 MG Tabs tablet Generic drug:  rivaroxaban Take 20 mg by mouth every morning.      Follow-up Information    Mickle Plumb, NP Follow up in 1 week(s).   Specialty:  Internal Medicine Contact information: [facility] [address] [place] [address] [phone]        Xu,Jindong, MD. Schedule an appointment as soon as possible for a visit in 6 week(s).   Specialty:  Neurology Contact  information: [address] [place] [address] [phone]        Lewayne Bunting, MD .   Specialty:  Cardiology Why:  Office will contact you to arrange a stress test as an out patient in 3-4 weeks. Contact information: [address] [place] [address] [phone]            Time coordinating discharge: 35 min  Signed:  Jhordan Kinter Juanetta Gosling   [facility] 05/19/2016, 3:52 PM

## 2016-05-19 NOTE — Evaluation (Signed)
Occupational Therapy Evaluation Patient Details Name: Dean [name] MRN: [id number] DOB: [birth year] Today's Date: 05/19/2016    History of Present Illness Pt adm with lt sided weakness and ETOH level of 200.  CT neg. Repeat CT pending. PMH - OA of bil hips, afib, HTN, anxiety, DM, Wolf Parkinson White Syndrome, morbid obesity. MRI pending   Clinical Impression   This 45 yo male admitted with above presents to acute OT with deficits below (see OT problem list), thus affecting his PLOF of completely independent with basic and IADLs including working as a Engineer, civil (consulting). He will benefit from acute OT with follow up OPOT to continue to work on increasing use of his LUE and continuing to address vision.    Follow Up Recommendations  Outpatient OT;Supervision/Assistance - 24 hour    Equipment Recommendations  None recommended by OT       Precautions / Restrictions Precautions Precautions: Fall Restrictions Weight Bearing Restrictions: No      Mobility Bed Mobility Overal bed mobility: Modified Independent             General bed mobility comments: HOB up and use of rail  Transfers Overall transfer level: Needs assistance Equipment used: None Transfers: Sit to/from Stand Sit to Stand: Supervision              Balance Overall balance assessment: Needs assistance Sitting-balance support: No upper extremity supported;Feet supported Sitting balance-Leahy Scale: Good     Standing balance support: No upper extremity supported;During functional activity Standing balance-Leahy Scale: Fair                              ADL Overall ADL's : Needs assistance/impaired Eating/Feeding: Set up;Sitting   Grooming: Supervision/safety;Standing;Wash/dry hands   Upper Body Bathing: Supervision/ safety;Sitting   Lower Body Bathing: Minimal assistance (with S sit<>stand)   Upper Body Dressing : Minimal assistance;Sitting   Lower Body Dressing: Minimal assistance  (with S sit<>stand)   Toilet Transfer: Supervision/safety;Ambulation;Regular Toilet;Grab bars   Toileting- Clothing Manipulation and Hygiene: Supervision/safety;Sit to/from stand   Tub/ Shower Transfer: Tub transfer;Supervision/safety;Ambulation (no DME)           Vision Vision Assessment?: Yes Eye Alignment: Within Functional Limits (appear WNL but pt reports occassional diplopia v. blurry vision) Ocular Range of Motion: Within Functional Limits Alignment/Gaze Preference: Within Defined Limits Tracking/Visual Pursuits:  (increased difficulty tracking to left--left eye tends to drift upward and ptosis of left eye lid increases) Convergence: Within functional limits Visual Fields: Left homonymous hemianopsia (pt also with slow responses, so may be more to this v. true visual field cut) Diplopia Assessment: Disappears with one eye closed;Objects split on top of one another;Present in far gaze;Present in near gaze (pt reports he can easily bring object into one, but does not maintain) Additional Comments: Educated pt on continuing to try to work on bringing objects into 1 when he sees 2.          Pertinent Vitals/Pain Pain Assessment: No/denies pain     Hand Dominance Right   Extremity/Trunk Assessment Upper Extremity Assessment Upper Extremity Assessment: LUE deficits/detail LUE Deficits / Details: Can raise arm up to about 90 degrees of shoulder flexion, full but slow AROM for elbow flexion/extension, really has to concentrate on digit composite flexion and extension, slow and increased effort for finger opposition LUE Coordination: decreased fine motor;decreased gross motor           Communication Communication Communication: No difficulties  Cognition Arousal/Alertness: Awake/alert Behavior During Therapy: Flat affect Overall Cognitive Status: Impaired/Different from baseline Area of Impairment: Problem solving             Problem Solving: Slow  processing General Comments: slow to respond at times      Exercises   Other Exercises Other Exercises: Gave pt red putty and handout for squence of activities to do with it and I went over this with him. As well as another handout with FM and GM activities on it and I went over these with him. Made pt aware he needs to use, use, use his left upper extremity to get function back more readily.        Home Living Family/patient expects to be discharged to:: Private residence Living Arrangements: Spouse/significant other;Children Available Help at Discharge: Family;Available 24 hours/day Type of Home: House Home Access: Stairs to enter Entergy CorporationEntrance Stairs-Number of Steps: 3-4 Entrance Stairs-Rails: Right;Left Home Layout: One level     Bathroom Shower/Tub: Tub/shower unit;Door Shower/tub characteristics: Teacher, early years/preCurtain Bathroom Toilet: Standard     Home Equipment: None          Prior Functioning/Environment Level of Independence: Independent        Comments: Works in Financial traderconcrete business        OT Problem List: Decreased strength;Decreased range of motion;Impaired balance (sitting and/or standing);Impaired vision/perception;Decreased coordination;Decreased cognition;Impaired UE functional use;Obesity;Impaired tone   OT Treatment/Interventions: Self-care/ADL training;Therapeutic activities;DME and/or AE instruction;Therapeutic exercise;Visual/perceptual remediation/compensation;Patient/family education    OT Goals(Current goals can be found in the care plan section) Acute Rehab OT Goals Patient Stated Goal: return home and back to work OT Goal Formulation: With patient/family Time For Goal Achievement: 05/26/16 Potential to Achieve Goals: Good  OT Frequency: Min 3X/week              End of Session Equipment Utilized During Treatment: Gait belt  Activity Tolerance: Patient tolerated treatment well Patient left: in bed;with call bell/phone within reach;with bed alarm set;with  family/visitor present   Time: [id number] OT Time Calculation (min): 50 min Charges:  OT General Charges $OT Visit: 1 Procedure OT Evaluation $OT Eval Moderate Complexity: 1 Procedure OT Treatments $Self Care/Home Management : 8-22 mins $Therapeutic Exercise: 8-22 mins  Evette GeorgesLeonard, Sandford Diop Eva [phone] 05/19/2016, 11:09 AM

## 2016-05-20 NOTE — Progress Notes (Signed)
05/20/2016 at 0837: Pt discharged late yesterday to home with family. He had recommendations for outpatient PT/OT. Pt given prescription from MD for outpatient therapy.

## 2016-06-03 ENCOUNTER — Encounter: Payer: Self-pay | Admitting: Nurse Practitioner

## 2016-06-10 ENCOUNTER — Ambulatory Visit: Payer: Medicare Other | Admitting: Nurse Practitioner

## 2016-06-10 NOTE — Progress Notes (Deleted)
CARDIOLOGY OFFICE NOTE  Date:  06/10/2016    Dean [name] Date of Birth: [birth year] Medical Record #[id number]  PCP:  Mickle PlumbStone, Nancy, NP  Cardiologist:  Tyrone SageGerhardt & ***    No chief complaint on file.   History of Present Illness: Dean [name] is a 45 y.o. male who presents today for a ***   Comes in today. Here with   Past Medical History:  Diagnosis Date  . Anxiety   . Asthma   . Atrial arrhythmia    multiples with 5-6 ablations  . Atrial fibrillation with rapid ventricular response (HCC)    With rates up to 200 as well as short PR tachycardia, cycle length of 240 ms.  . Chest tightness 05/08/14  . Diabetes mellitus, type II (HCC)   . GERD (gastroesophageal reflux disease)   . Morbid obesity (HCC)   . Palpitations 05/08/14  . Pneumonia ~ 2010  . Snores    Wil occasional apnea. Had a remote sleep study but does not remember results.  . SOB (shortness of breath) 05/08/14  . SVT (supraventricular tachycardia) (HCC)   . WPW (Wolff-Parkinson-White syndrome)     Past Surgical History:  Procedure Laterality Date  . ATRIAL FIBRILLATION ABLATION     "& WPW"  . CARDIAC CATHETERIZATION     "6 or 7" (05/18/2016)  . WRIST FRACTURE SURGERY Right 1990s     Medications: Current Outpatient Prescriptions  Medication Sig Dispense Refill  . alprazolam (XANAX) 2 MG tablet Take 2 mg by mouth 2 (two) times daily.     Marland Kitchen. amiodarone (PACERONE) 200 MG tablet Take 1 tablet (200 mg total) by mouth daily. 30 tablet 6  . aspirin EC 81 MG tablet Take 1 tablet (81 mg total) by mouth daily.    Marland Kitchen. atorvastatin (LIPITOR) 20 MG tablet Take 1 tablet (20 mg total) by mouth daily at 6 PM. 30 tablet 0  . budesonide-formoterol (SYMBICORT) 160-4.5 MCG/ACT inhaler Inhale 2 puffs into the lungs daily as needed (shortness of breath).     . cetirizine (ZYRTEC) 10 MG tablet Take 10 mg by mouth daily.    Marland Kitchen. diltiazem (CARDIZEM CD) 180 MG 24 hr capsule Take 180 mg by mouth daily.     Marland Kitchen.  HYDROcodone-acetaminophen (NORCO/VICODIN) 5-325 MG tablet Take 1-2 tablets by mouth every 6 (six) hours as needed (pain).     Marland Kitchen. ipratropium (ATROVENT) 0.02 % nebulizer solution Take 250 mcg by nebulization every 4 (four) hours as needed. As needed for shortness of breath    . levalbuterol (XOPENEX HFA) 45 MCG/ACT inhaler Inhale 1-2 puffs into the lungs every 6 (six) hours as needed for wheezing or shortness of breath.    . metFORMIN (GLUCOPHAGE) 1000 MG tablet Take 1 tablet by mouth 2 (two) times daily.    . montelukast (SINGULAIR) 10 MG tablet Take 10 mg by mouth every morning.     . multivitamin-iron-minerals-folic acid (CENTRUM) chewable tablet Chew 1 tablet by mouth daily.    . nicotine (NICODERM CQ - DOSED IN MG/24 HOURS) 14 mg/24hr patch Place 1 patch (14 mg total) onto the skin daily. 28 patch 0  . pantoprazole (PROTONIX) 40 MG tablet Take 40 mg by mouth daily.     . rivaroxaban (XARELTO) 20 MG TABS tablet Take 20 mg by mouth every morning.     Marland Kitchen. UNABLE TO FIND Outpatient PT/OT dx: TIA eval and treat 1 each 0   No current facility-administered medications for this visit.  Allergies: Allergies  Allergen Reactions  . Prednisone Other (See Comments)    "goes crazy"    Social History: The patient  reports that he has been smoking.  He has a 30.00 pack-year smoking history. He has never used smokeless tobacco. He reports that he drinks about 3.6 oz of alcohol per week . He reports that he does not use drugs.   Family History: The patient's ***family history includes Clotting disorder in his father; Diabetes in his father; Heart attack (age of onset: 9) in his father; Hypertension in his father.   Review of Systems: Please see the history of present illness.   Otherwise, the review of systems is positive for {NONE DEFAULTED:18576::"none"}.   All other systems are reviewed and negative.   Physical Exam: VS:  There were no vitals taken for this visit. Marland Kitchen  BMI There is no height or  weight on file to calculate BMI.  Wt Readings from Last 3 Encounters:  05/18/16 (!) 375 lb (170.1 kg)  03/02/16 (!) 371 lb (168.3 kg)  08/22/15 (!) 369 lb (167.4 kg)    General: Pleasant. Well developed, well nourished and in no acute distress.   HEENT: Normal.  Neck: Supple, no JVD, carotid bruits, or masses noted.  Cardiac: ***Regular rate and rhythm. No murmurs, rubs, or gallops. No edema.  Respiratory:  Lungs are clear to auscultation bilaterally with normal work of breathing.  GI: Soft and nontender.  MS: No deformity or atrophy. Gait and ROM intact.  Skin: Warm and dry. Color is normal.  Neuro:  Strength and sensation are intact and no gross focal deficits noted.  Psych: Alert, appropriate and with normal affect.   LABORATORY DATA:  EKG:  EKG {ACTION; IS/IS ZOX:[id number]} ordered today. This demonstrates ***.  Lab Results  Component Value Date   WBC 12.8 (H) 05/19/2016   HGB 15.3 05/19/2016   HCT 46.6 05/19/2016   PLT 259 05/19/2016   GLUCOSE 275 (H) 05/19/2016   CHOL 196 05/18/2016   TRIG 324 (H) 05/18/2016   HDL 33 (L) 05/18/2016   LDLCALC 98 05/18/2016   ALT 70 (H) 05/17/2016   AST 41 05/17/2016   NA 135 05/19/2016   K 4.8 05/19/2016   CL 101 05/19/2016   CREATININE 0.73 05/19/2016   BUN 13 05/19/2016   CO2 26 05/19/2016   TSH 2.74 03/02/2016   INR 1.09 05/17/2016   HGBA1C 11.4 (H) 05/18/2016    BNP (last 3 results) No results for input(s): BNP in the last 8760 hours.  ProBNP (last 3 results) No results for input(s): PROBNP in the last 8760 hours.   Other Studies Reviewed Today:   Assessment/Plan:   Current medicines are reviewed with the patient today.  The patient does not have concerns regarding medicines other than what has been noted above.  The following changes have been made:  See above.  Labs/ tests ordered today include:   No orders of the defined types were placed in this encounter.    Disposition:   FU with *** in {gen number  [id number]:191478} {Days to years:10300}.   Patient is agreeable to this plan and will call if any problems develop in the interim.   Signed: Rosalio Macadamia, RN, ANP-C 06/10/2016 7:33 AM  [facility] [address] [place], [place]  [address] Phone: [phone] Fax: [phone]

## 2016-06-16 ENCOUNTER — Ambulatory Visit: Payer: Medicare Other | Admitting: Nurse Practitioner

## 2016-07-01 ENCOUNTER — Ambulatory Visit: Payer: Self-pay | Admitting: Diagnostic Neuroimaging

## 2016-08-03 ENCOUNTER — Ambulatory Visit: Payer: Medicare Other | Admitting: Diagnostic Neuroimaging

## 2016-08-04 ENCOUNTER — Encounter: Payer: Self-pay | Admitting: Diagnostic Neuroimaging

## 2016-11-11 ENCOUNTER — Encounter: Payer: Self-pay | Admitting: Internal Medicine

## 2016-11-23 ENCOUNTER — Ambulatory Visit (INDEPENDENT_AMBULATORY_CARE_PROVIDER_SITE_OTHER): Payer: Medicare Other | Admitting: Internal Medicine

## 2016-11-23 ENCOUNTER — Encounter: Payer: Self-pay | Admitting: Internal Medicine

## 2016-11-23 VITALS — BP 154/102 | HR 83 | Ht 78.0 in | Wt 365.0 lb

## 2016-11-23 DIAGNOSIS — I456 Pre-excitation syndrome: Secondary | ICD-10-CM

## 2016-11-23 DIAGNOSIS — I480 Paroxysmal atrial fibrillation: Secondary | ICD-10-CM

## 2016-11-23 DIAGNOSIS — I10 Essential (primary) hypertension: Secondary | ICD-10-CM

## 2016-11-23 NOTE — Progress Notes (Signed)
HPI Mr. Sowash returns today for followup. He is a pleasant 46 yo morbidly obese man with a long h/o tachypalpitations and WPW syndrome. He has undergone multiple catheter ablations but has persistent WPW and has developed worsening atrial fib with a RVR. Since being placed on amio, he has improved. No heart racing and no sob. He is still smoking but less than a half pack a day. Allergies  Allergen Reactions  . Prednisone Other (See Comments)    "goes crazy"     Current Outpatient Prescriptions  Medication Sig Dispense Refill  . alprazolam (XANAX) 2 MG tablet Take 2 mg by mouth 2 (two) times daily.     Marland Kitchen amiodarone (PACERONE) 200 MG tablet Take 1 tablet (200 mg total) by mouth daily. 30 tablet 6  . aspirin EC 81 MG tablet Take 1 tablet (81 mg total) by mouth daily.    Marland Kitchen atorvastatin (LIPITOR) 20 MG tablet Take 1 tablet (20 mg total) by mouth daily at 6 PM. 30 tablet 0  . budesonide-formoterol (SYMBICORT) 160-4.5 MCG/ACT inhaler Inhale 2 puffs into the lungs daily as needed (shortness of breath).     . cetirizine (ZYRTEC) 10 MG tablet Take 10 mg by mouth daily.    Marland Kitchen diltiazem (CARDIZEM CD) 180 MG 24 hr capsule Take 180 mg by mouth daily.     Marland Kitchen ipratropium (ATROVENT) 0.02 % nebulizer solution Take 250 mcg by nebulization every 4 (four) hours as needed. As needed for shortness of breath    . levalbuterol (XOPENEX HFA) 45 MCG/ACT inhaler Inhale 1-2 puffs into the lungs every 6 (six) hours as needed for wheezing or shortness of breath.    . metFORMIN (GLUCOPHAGE) 1000 MG tablet Take 1 tablet by mouth 2 (two) times daily.    . montelukast (SINGULAIR) 10 MG tablet Take 10 mg by mouth every morning.     . multivitamin-iron-minerals-folic acid (CENTRUM) chewable tablet Chew 1 tablet by mouth daily.    . nicotine (NICODERM CQ - DOSED IN MG/24 HOURS) 14 mg/24hr patch Place 1 patch (14 mg total) onto the skin daily. 28 patch 0  . rivaroxaban (XARELTO) 20 MG TABS tablet Take 20 mg by mouth every  morning.     . traMADol (ULTRAM) 50 MG tablet Take 50 mg by mouth as needed for moderate pain or severe pain.    Marland Kitchen UNABLE TO FIND Outpatient PT/OT dx: TIA eval and treat 1 each 0   No current facility-administered medications for this visit.      Past Medical History:  Diagnosis Date  . Anxiety   . Asthma   . Atrial arrhythmia    multiples with 5-6 ablations  . Atrial fibrillation with rapid ventricular response (HCC)    With rates up to 200 as well as short PR tachycardia, cycle length of 240 ms.  . Chest tightness 05/08/14  . Diabetes mellitus, type II (HCC)   . GERD (gastroesophageal reflux disease)   . Morbid obesity (HCC)   . Palpitations 05/08/14  . Pneumonia ~ 2010  . Snores    Wil occasional apnea. Had a remote sleep study but does not remember results.  . SOB (shortness of breath) 05/08/14  . SVT (supraventricular tachycardia) (HCC)   . WPW (Wolff-Parkinson-White syndrome)     ROS:   All systems reviewed and negative except as noted in the HPI.   Past Surgical History:  Procedure Laterality Date  . ATRIAL FIBRILLATION ABLATION     "& WPW"  .  CARDIAC CATHETERIZATION     "6 or 7" (05/18/2016)  . WRIST FRACTURE SURGERY Right 1990s     Family History  Problem Relation Age of Onset  . Clotting disorder Father   . Diabetes Father   . Heart attack Father 25  . Hypertension Father      Social History   Social History  . Marital status: Single    Spouse name: N/A  . Number of children: N/A  . Years of education: N/A   Occupational History  . Not on file.   Social History Main Topics  . Smoking status: Current Every Day Smoker    Packs/day: 1.00    Years: 30.00  . Smokeless tobacco: Never Used  . Alcohol use 3.6 oz/week    6 Cans of beer per week     Comment: binge  . Drug use: No  . Sexual activity: Yes   Other Topics Concern  . Not on file   Social History Narrative  . No narrative on file     BP (!) 154/102   Pulse 83   Ht   (1.981 m)   Wt (!) 365 lb (165.6 kg)   BMI 42.18 kg/m   Physical Exam:  obese appearing 46 yo man, NAD HEENT: Unremarkable Neck:  7 cm JVD, no thyromegally Lymphatics:  No adenopathy Back:  No CVA tenderness Lungs:  Clear with no wheezes HEART:  reg rate and rhythm, no murmurs, no rubs, no clicks Abd:  soft, positive bowel sounds, no organomegally, no rebound, no guarding Ext:  2 plus pulses, no edema, no cyanosis, no clubbing Skin:  No rashes no nodules Neuro:  CN II through XII intact, motor grossly intact  EKG - NSR with no pre-excitation  Assess/Plan: 1. WPW - he is without pre-excitation on his ECG today. He will continue amio 200 mg daily. 2. PAF - he is maintaining NSR. Continue current meds. 3. Coags - he is tolerating Xarelto 4. HTN - his blood pressure remains elevated. He is encouraged to lose weight  Leonia Reeves.D.

## 2016-11-23 NOTE — Patient Instructions (Signed)
Medication Instructions:  Your physician recommends that you continue on your current medications as directed. Please refer to the Current Medication list given to you today.   Labwork: None Ordered   Testing/Procedures: None Ordered   Follow-Up: Your physician wants you to follow-up in: 1 year with Dr. Taylor.  You will receive a reminder letter in the mail two months in advance. If you don't receive a letter, please call our office to schedule the follow-up appointment.   If you need a refill on your cardiac medications before your next appointment, please call your pharmacy.   Thank you for choosing [facility]! Kwana Ringel, RN [phone]    

## 2016-12-13 ENCOUNTER — Other Ambulatory Visit: Payer: Self-pay | Admitting: Internal Medicine

## 2016-12-13 DIAGNOSIS — I480 Paroxysmal atrial fibrillation: Secondary | ICD-10-CM

## 2016-12-13 DIAGNOSIS — I456 Pre-excitation syndrome: Secondary | ICD-10-CM

## 2017-07-02 ENCOUNTER — Emergency Department (HOSPITAL_BASED_OUTPATIENT_CLINIC_OR_DEPARTMENT_OTHER): Payer: Self-pay

## 2017-07-02 ENCOUNTER — Other Ambulatory Visit: Payer: Self-pay

## 2017-07-02 ENCOUNTER — Encounter (HOSPITAL_BASED_OUTPATIENT_CLINIC_OR_DEPARTMENT_OTHER): Payer: Self-pay | Admitting: Emergency Medicine

## 2017-07-02 ENCOUNTER — Emergency Department (HOSPITAL_BASED_OUTPATIENT_CLINIC_OR_DEPARTMENT_OTHER)
Admission: EM | Admit: 2017-07-02 | Discharge: 2017-07-02 | Disposition: A | Discharge status: Home / Self Care | Payer: Self-pay | Attending: Emergency Medicine | Admitting: Emergency Medicine

## 2017-07-02 DIAGNOSIS — G4733 Obstructive sleep apnea (adult) (pediatric): Secondary | ICD-10-CM | POA: Insufficient documentation

## 2017-07-02 DIAGNOSIS — J449 Chronic obstructive pulmonary disease, unspecified: Secondary | ICD-10-CM | POA: Insufficient documentation

## 2017-07-02 DIAGNOSIS — Z8673 Personal history of transient ischemic attack (TIA), and cerebral infarction without residual deficits: Secondary | ICD-10-CM | POA: Insufficient documentation

## 2017-07-02 DIAGNOSIS — Z7984 Long term (current) use of oral hypoglycemic drugs: Secondary | ICD-10-CM | POA: Insufficient documentation

## 2017-07-02 DIAGNOSIS — I4891 Unspecified atrial fibrillation: Secondary | ICD-10-CM | POA: Insufficient documentation

## 2017-07-02 DIAGNOSIS — Z7901 Long term (current) use of anticoagulants: Secondary | ICD-10-CM | POA: Insufficient documentation

## 2017-07-02 DIAGNOSIS — R911 Solitary pulmonary nodule: Secondary | ICD-10-CM | POA: Insufficient documentation

## 2017-07-02 DIAGNOSIS — Z79899 Other long term (current) drug therapy: Secondary | ICD-10-CM | POA: Insufficient documentation

## 2017-07-02 DIAGNOSIS — J45909 Unspecified asthma, uncomplicated: Secondary | ICD-10-CM | POA: Insufficient documentation

## 2017-07-02 DIAGNOSIS — I2510 Atherosclerotic heart disease of native coronary artery without angina pectoris: Secondary | ICD-10-CM | POA: Insufficient documentation

## 2017-07-02 DIAGNOSIS — F17210 Nicotine dependence, cigarettes, uncomplicated: Secondary | ICD-10-CM | POA: Insufficient documentation

## 2017-07-02 DIAGNOSIS — K859 Acute pancreatitis without necrosis or infection, unspecified: Secondary | ICD-10-CM | POA: Insufficient documentation

## 2017-07-02 DIAGNOSIS — E119 Type 2 diabetes mellitus without complications: Secondary | ICD-10-CM | POA: Insufficient documentation

## 2017-07-02 DIAGNOSIS — R110 Nausea: Secondary | ICD-10-CM | POA: Insufficient documentation

## 2017-07-02 DIAGNOSIS — Z7982 Long term (current) use of aspirin: Secondary | ICD-10-CM | POA: Insufficient documentation

## 2017-07-02 LAB — COMPREHENSIVE METABOLIC PANEL
ALBUMIN: 3.8 g/dL (ref 3.5–5.0)
ALK PHOS: 131 U/L — AB (ref 38–126)
ALT: 59 U/L (ref 17–63)
ALT: 52 U/L (ref 17–63)
ANION GAP: 6 (ref 5–15)
AST: 71 U/L — AB (ref 15–41)
AST: 38 U/L (ref 15–41)
Albumin: 3.7 g/dL (ref 3.5–5.0)
Alkaline Phosphatase: 128 U/L — ABNORMAL HIGH (ref 38–126)
Anion gap: 11 (ref 5–15)
BILIRUBIN TOTAL: 0.6 mg/dL (ref 0.3–1.2)
BUN: 14 mg/dL (ref 6–20)
BUN: 13 mg/dL (ref 6–20)
CALCIUM: 8.3 mg/dL — AB (ref 8.9–10.3)
CO2: 26 mmol/L (ref 22–32)
CO2: 30 mmol/L (ref 22–32)
CREATININE: 0.99 mg/dL (ref 0.61–1.24)
CREATININE: 0.76 mg/dL (ref 0.61–1.24)
Calcium: 8.7 mg/dL — ABNORMAL LOW (ref 8.9–10.3)
Chloride: 94 mmol/L — ABNORMAL LOW (ref 101–111)
Chloride: 96 mmol/L — ABNORMAL LOW (ref 101–111)
GFR calc Af Amer: >60 mL/min (ref >60)
GFR calc non Af Amer: >60 mL/min (ref >60)
GLUCOSE: 320 mg/dL — AB (ref 65–99)
GLUCOSE: 273 mg/dL — AB (ref 65–99)
Potassium: 5.4 mmol/L — ABNORMAL HIGH (ref 3.5–5.1)
Potassium: 4.2 mmol/L (ref 3.5–5.1)
Sodium: 131 mmol/L — ABNORMAL LOW (ref 135–145)
Sodium: 132 mmol/L — ABNORMAL LOW (ref 135–145)
TOTAL PROTEIN: 7 g/dL (ref 6.5–8.1)
TOTAL PROTEIN: 7 g/dL (ref 6.5–8.1)
Total Bilirubin: 1.5 mg/dL — ABNORMAL HIGH (ref 0.3–1.2)

## 2017-07-02 LAB — URINALYSIS, ROUTINE W REFLEX MICROSCOPIC
BILIRUBIN URINE: NEGATIVE
Ketones, ur: NEGATIVE mg/dL
LEUKOCYTES UA: NEGATIVE
NITRITE: NEGATIVE
PH: 6 (ref 5.0–8.0)
PROTEIN: 100 mg/dL — AB
Specific Gravity, Urine: 1.02 (ref 1.005–1.030)

## 2017-07-02 LAB — LIPASE, BLOOD: LIPASE: 34 U/L (ref 11–51)

## 2017-07-02 LAB — CBC WITH DIFFERENTIAL/PLATELET
BASOS PCT: 0 %
Basophils Absolute: 0 10*3/uL (ref 0.0–0.1)
EOS ABS: 0.3 10*3/uL (ref 0.0–0.7)
Eosinophils Relative: 2 %
HCT: 47.7 % (ref 39.0–52.0)
Hemoglobin: 16.7 g/dL (ref 13.0–17.0)
Lymphocytes Relative: 18 %
Lymphs Abs: 2.3 10*3/uL (ref 0.7–4.0)
MCH: 31.1 pg (ref 26.0–34.0)
MCHC: 35 g/dL (ref 30.0–36.0)
MCV: 88.8 fL (ref 78.0–100.0)
MONO ABS: 0.9 10*3/uL (ref 0.1–1.0)
Monocytes Relative: 7 %
NEUTROS PCT: 73 %
Neutro Abs: 9.3 10*3/uL — ABNORMAL HIGH (ref 1.7–7.7)
Platelets: 279 10*3/uL (ref 150–400)
RBC: 5.37 MIL/uL (ref 4.22–5.81)
RDW: 13.8 % (ref 11.5–15.5)
WBC: 12.8 10*3/uL — AB (ref 4.0–10.5)

## 2017-07-02 LAB — URINALYSIS, MICROSCOPIC (REFLEX)
RBC / HPF: NONE SEEN RBC/hpf (ref 0–5)
WBC, UA: NONE SEEN WBC/hpf (ref 0–5)

## 2017-07-02 LAB — TROPONIN I: Troponin I: <0.03 ng/mL (ref <0.03)

## 2017-07-02 LAB — ETHANOL: Alcohol, Ethyl (B): <10 mg/dL (ref <10)

## 2017-07-02 LAB — CBG MONITORING, ED: Glucose-Capillary: 270 mg/dL — ABNORMAL HIGH (ref 65–99)

## 2017-07-02 MED ORDER — PROMETHAZINE HCL 25 MG PO TABS
25.0000 mg | ORAL_TABLET | Freq: Four times a day (QID) | ORAL | 0 refills | Status: DC | PRN
Start: 2017-07-02 — End: 2019-11-17

## 2017-07-02 MED ORDER — OXYCODONE-ACETAMINOPHEN 7.5-325 MG PO TABS: 1.0000 | ORAL_TABLET | Freq: Four times a day (QID) | ORAL | 0 refills | Status: DC | PRN

## 2017-07-02 MED ORDER — HYDROMORPHONE HCL 1 MG/ML IJ SOLN
1.0000 mg | Freq: Once | INTRAMUSCULAR | Status: AC
  Administered 2017-07-02: 1 mg via INTRAVENOUS
  Filled 2017-07-02: qty 1

## 2017-07-02 MED ORDER — ESOMEPRAZOLE MAGNESIUM 40 MG PO CPDR: 40.0000 mg | DELAYED_RELEASE_CAPSULE | Freq: Every day | ORAL | 0 refills | Status: DC

## 2017-07-02 MED ORDER — GI COCKTAIL ~~LOC~~
30.0000 mL | Freq: Once | ORAL | Status: AC
  Administered 2017-07-02: 30 mL via ORAL
  Filled 2017-07-02: qty 30

## 2017-07-02 MED ORDER — FAMOTIDINE IN NACL 20-0.9 MG/50ML-% IV SOLN
20.0000 mg | Freq: Once | INTRAVENOUS | Status: AC
  Administered 2017-07-02: 20 mg via INTRAVENOUS
  Filled 2017-07-02: qty 50

## 2017-07-02 MED ORDER — IOPAMIDOL (ISOVUE-370) INJECTION 76%
100.0000 mL | Freq: Once | INTRAVENOUS | Status: AC | PRN
  Administered 2017-07-02: 100 mL via INTRAVENOUS

## 2017-07-02 MED ORDER — MORPHINE SULFATE (PF) 4 MG/ML IV SOLN
4.0000 mg | Freq: Once | INTRAVENOUS | Status: AC
  Administered 2017-07-02: 4 mg via INTRAVENOUS
  Filled 2017-07-02: qty 1

## 2017-07-02 MED ORDER — ONDANSETRON HCL 4 MG/2ML IJ SOLN
4.0000 mg | Freq: Once | INTRAMUSCULAR | Status: AC
  Administered 2017-07-02: 4 mg via INTRAVENOUS
  Filled 2017-07-02: qty 2

## 2017-07-02 MED ORDER — SODIUM CHLORIDE 0.9 % IV BOLUS (SEPSIS)
1000.0000 mL | Freq: Once | INTRAVENOUS | Status: AC
  Administered 2017-07-02: 1000 mL via INTRAVENOUS

## 2017-07-02 NOTE — Discharge Instructions (Signed)
You have mild pancreatitis.   Stay hydrated.,   Take phenergan for nausea.   Take percocet for severe pain. Do NOT drive with it.   Your oxygen is low when you lay down. You likely have sleep apnea. Consider sleep study outpatient.   See your doctor.   You have pulmonary nodule that needs to be followed up with your doctor.   Return to ER if you have worse abdominal pain, vomiting, fever, dehydration, chest pain, trouble breathing.

## 2017-07-02 NOTE — ED Notes (Signed)
Patient transported to CT/XRay.

## 2017-07-02 NOTE — ED Provider Notes (Signed)
[facility] EMERGENCY DEPARTMENT Provider Note   CSN: [id number] Arrival date & time: 07/02/17  1847     History   Chief Complaint Chief Complaint  Patient presents with  . Chest Pain    HPI Dean [name] is a 46 y.o. male history of WPW, Afib on xarelto, chronic alcoholic here presenting with epigastric pain.  Patient states that he has been having epigastric pain for the last 3 days.  He states that it is worse after he eats and feels nauseated.  He does drink alcohol socially now and last alcohol use was several days ago.  Feeling nauseated but denies any vomiting.  Denies any fevers or chills.  He does have a history of WPW but denies any chest pain or shortness of breath currently. Patient is taking his Xarelto.  Patient states that he had previous endoscopy that showed gastritis but is not taking any PPI currently.   The history is provided by the patient.    Past Medical History:  Diagnosis Date  . Anxiety   . Asthma   . Atrial arrhythmia    multiples with 5-6 ablations  . Atrial fibrillation with rapid ventricular response (HCC)    With rates up to 200 as well as short PR tachycardia, cycle length of 240 ms.  . Chest tightness 05/08/14  . Diabetes mellitus, type II (HCC)   . GERD (gastroesophageal reflux disease)   . Morbid obesity (HCC)   . Palpitations 05/08/14  . Pneumonia ~ 2010  . Snores    Wil occasional apnea. Had a remote sleep study but does not remember results.  . SOB (shortness of breath) 05/08/14  . SVT (supraventricular tachycardia) (HCC)   . WPW (Wolff-Parkinson-White syndrome)     Patient Active Problem List   Diagnosis Date Noted  . Stroke-like symptoms 05/18/2016  . Smoker 05/18/2016  . COPD (chronic obstructive pulmonary disease) (HCC) 05/18/2016  . ETOH abuse 05/18/2016  . Pulmonary nodule, right 05/18/2016  . CAD-noted on chest CT 05/18/2016  . Family history of coronary artery disease in father 05/18/2016  . Cerebrovascular  accident (CVA) due to thrombosis of right middle cerebral artery (HCC)   . Mixed hyperlipidemia   . Long term current use of anticoagulant 08/16/2015  . Gastric catarrh 08/16/2015  . History of biliary T-tube placement 08/16/2015  . Personal history of other diseases of the circulatory system 08/16/2015  . Polycythemia, secondary 08/16/2015  . Paroxysmal atrial fibrillation (HCC) 07/18/2015  . Morbid obesity (HCC) 07/18/2015  . Essential hypertension 07/18/2015  . Type 2 diabetes mellitus without complication, without long-term current use of insulin (HCC) 05/10/2014  . History of PSVT (paroxysmal supraventricular tachycardia) 05/09/2014  . WPW (Wolff-Parkinson-White syndrome) 05/09/2014  . Avascular necrosis of bone of hip (HCC) 07/12/2013  . Acid reflux 07/12/2013  . Arthralgia of hip 07/12/2013  . Chest pain of unknown etiology 05/01/2013    Past Surgical History:  Procedure Laterality Date  . ATRIAL FIBRILLATION ABLATION     "& WPW"  . CARDIAC CATHETERIZATION     "6 or 7" (05/18/2016)  . WRIST FRACTURE SURGERY Right 1990s       Home Medications    Prior to Admission medications   Medication Sig Start Date End Date Taking? Authorizing Provider  alprazolam Prudy Feeler) 2 MG tablet Take 2 mg by mouth 2 (two) times daily.     [provider]  amiodarone (PACERONE) 200 MG tablet TAKE 1 TABLET ONCE DAILY. 12/14/16   Marinus Maw, MD  aspirin EC 81 MG tablet Take 1 tablet (81 mg total) by mouth daily. 05/19/16   Joseph ArtVann, Jessica U, DO  atorvastatin (LIPITOR) 20 MG tablet Take 1 tablet (20 mg total) by mouth daily at 6 PM. 05/19/16   Joseph ArtVann, Jessica U, DO  budesonide-formoterol (SYMBICORT) 160-4.5 MCG/ACT inhaler Inhale 2 puffs into the lungs daily as needed (shortness of breath).     [provider]  cetirizine (ZYRTEC) 10 MG tablet Take 10 mg by mouth daily.    [provider]  diltiazem (CARDIZEM CD) 180 MG 24 hr capsule Take 180 mg by mouth daily.      [provider]  ipratropium (ATROVENT) 0.02 % nebulizer solution Take 250 mcg by nebulization every 4 (four) hours as needed. As needed for shortness of breath    [provider]  levalbuterol (XOPENEX HFA) 45 MCG/ACT inhaler Inhale 1-2 puffs into the lungs every 6 (six) hours as needed for wheezing or shortness of breath.    [provider]  metFORMIN (GLUCOPHAGE) 1000 MG tablet Take 1 tablet by mouth 2 (two) times daily. 08/17/15   [provider]  montelukast (SINGULAIR) 10 MG tablet Take 10 mg by mouth every morning.     [provider]  multivitamin-iron-minerals-folic acid (CENTRUM) chewable tablet Chew 1 tablet by mouth daily.    [provider]  nicotine (NICODERM CQ - DOSED IN MG/24 HOURS) 14 mg/24hr patch Place 1 patch (14 mg total) onto the skin daily. 05/20/16   Joseph ArtVann, Jessica U, DO  rivaroxaban (XARELTO) 20 MG TABS tablet Take 20 mg by mouth every morning.  07/18/15   Marinus Mawaylor, Gregg W, MD  traMADol (ULTRAM) 50 MG tablet Take 50 mg by mouth as needed for moderate pain or severe pain.    [provider]  UNABLE TO FIND Outpatient PT/OT dx: TIA eval and treat 05/19/16   Joseph ArtVann, Jessica U, DO    Family History Family History  Problem Relation Age of Onset  . Clotting disorder Father   . Diabetes Father   . Heart attack Father 3953  . Hypertension Father     Social History Social History   Tobacco Use  . Smoking status: Current Every Day Smoker    Packs/day: 1.00    Years: 30.00    Pack years: 30.00  . Smokeless tobacco: Never Used  Substance Use Topics  . Alcohol use: Yes    Alcohol/week: 3.6 oz    Types: 6 Cans of beer per week    Comment: binge  . Drug use: No     Allergies   Prednisone   Review of Systems Review of Systems  Gastrointestinal: Positive for abdominal pain.  All other systems reviewed and are negative.    Physical Exam Updated Vital Signs BP (!) 144/83   Pulse 68   Temp 98.1 F (36.7  C) (Oral)   Resp 13   Ht 6\' 6"  (1.981 m)   Wt (!) 152 kg (335 lb)   SpO2 96%   BMI 38.71 kg/m   Physical Exam  Constitutional:  Uncomfortable   HENT:  Head: Normocephalic.  Neck: Normal range of motion.  Cardiovascular: Regular rhythm and normal pulses.  Pulmonary/Chest: Effort normal.  Abdominal:  +RUQ and epigastric tenderness, neg murphy's   Musculoskeletal: Normal range of motion.  Neurological: He is alert.  Skin: Skin is warm.  Psychiatric: He has a normal mood and affect.  Nursing note and vitals reviewed.    ED Treatments / Results  Labs (  all labs ordered are listed, but only abnormal results are displayed) Labs Reviewed  CBC WITH DIFFERENTIAL/PLATELET - Abnormal; Notable for the following components:      Result Value   WBC 12.8 (*)    Neutro Abs 9.3 (*)    All other components within normal limits  COMPREHENSIVE METABOLIC PANEL - Abnormal; Notable for the following components:   Sodium 131 (*)    Potassium 5.4 (*)    Chloride 94 (*)    Glucose, Bld 320 (*)    Calcium 8.7 (*)    AST 71 (*)    Alkaline Phosphatase 131 (*)    Total Bilirubin 1.5 (*)    All other components within normal limits  COMPREHENSIVE METABOLIC PANEL - Abnormal; Notable for the following components:   Sodium 132 (*)    Chloride 96 (*)    Glucose, Bld 273 (*)    Calcium 8.3 (*)    Alkaline Phosphatase 128 (*)    All other components within normal limits  URINALYSIS, ROUTINE W REFLEX MICROSCOPIC - Abnormal; Notable for the following components:   Glucose, UA >=500 (*)    Hgb urine dipstick TRACE (*)    Protein, ur 100 (*)    All other components within normal limits  URINALYSIS, MICROSCOPIC (REFLEX) - Abnormal; Notable for the following components:   Bacteria, UA RARE (*)    Squamous Epithelial / LPF 0-5 (*)    All other components within normal limits  CBG MONITORING, ED - Abnormal; Notable for the following components:   Glucose-Capillary 270 (*)    All other components  within normal limits  LIPASE, BLOOD  TROPONIN I  ETHANOL    EKG  EKG Interpretation  Date/Time:  Friday July 02 2017 18:56:13 EST Ventricular Rate:  84 PR Interval:  186 QRS Duration: 84 QT Interval:  376 QTC Calculation: 444 R Axis:   -41 Text Interpretation:  Normal sinus rhythm Possible Left atrial enlargement Left axis deviation Abnormal ECG No significant change since last tracing Confirmed by Richardean Canal [phone]) on 07/02/2017 8:03:42 PM       Radiology Dg Chest 2 View  Result Date: 07/02/2017 CLINICAL DATA:  Chest pain EXAM: CHEST  2 VIEW COMPARISON:  Chest radiograph 05/08/2014 FINDINGS: Focal opacity in the right lower lobe may indicate developing consolidation. The lungs are otherwise clear. No pulmonary edema. Normal cardiomediastinal contours. IMPRESSION: Focal right lower lobe opacity may indicate developing consolidation, including the possibility of pneumonia. Electronically Signed   By: Deatra Robinson M.D.   On: 07/02/2017 21:15   Ct Angio Chest Pe W And/or Wo Contrast  Result Date: 07/02/2017 CLINICAL DATA:  Chest and abdominal pain EXAM: CT ANGIOGRAPHY CHEST CT ABDOMEN AND PELVIS WITH CONTRAST TECHNIQUE: Multidetector CT imaging of the chest was performed using the standard protocol during bolus administration of intravenous contrast. Multiplanar CT image reconstructions and MIPs were obtained to evaluate the vascular anatomy. Multidetector CT imaging of the abdomen and pelvis was performed using the standard protocol during bolus administration of intravenous contrast. CONTRAST:  ISOVUE-370 IOPAMIDOL (ISOVUE-370) INJECTION 76% COMPARISON:  None. FINDINGS: CTA CHEST FINDINGS Cardiovascular: Thoracic aorta is within normal limits. No aneurysmal dilatation or dissection is noted. No cardiac enlargement is seen. The pulmonary artery shows a normal branching pattern without intraluminal filling defect to suggest pulmonary embolism. No pericardial effusion is  noted. Mediastinum/Nodes: No hilar or mediastinal adenopathy is identified. The thoracic inlet is within normal limits. The visualized esophagus is within normal limits. Lungs/Pleura: Lungs  are well aerated bilaterally. No focal infiltrate or sizable effusion is seen. A 6 mm subpleural nodule is noted in the lingula best seen on image number 65 of series 6. A few scattered smaller nodules are identified in the left upper lobe and right middle lobe. Musculoskeletal: Mild degenerative changes of the thoracic spine are noted. No acute bony abnormality is noted. Review of the MIP images confirms the above findings. CT ABDOMEN and PELVIS FINDINGS Hepatobiliary: No focal liver abnormality is seen. No gallstones, gallbladder wall thickening, or biliary dilatation. Pancreas: The pancreas is well visualized and demonstrates some mild peripancreatic inflammatory changes consistent with mild pancreatitis. No significant phlegmon is noted. No cyst cysts are seen. No significant ductal dilatation is noted. No changes to suggest pancreatic necrosis are seen. Spleen: Normal in size without focal abnormality. Adrenals/Urinary Tract: Adrenal glands are unremarkable. Kidneys are normal, without renal calculi, focal lesion, or hydronephrosis. Bladder is unremarkable. Stomach/Bowel: No obstructive or inflammatory changes are identified. The appendix is within normal limits. Vascular/Lymphatic: No significant vascular findings are present. No enlarged abdominal or pelvic lymph nodes. Reproductive: Prostate is unremarkable. Other: No abdominal wall hernia or abnormality. No abdominopelvic ascites. Musculoskeletal: No acute or significant osseous findings. Review of the MIP images confirms the above findings. IMPRESSION: CTA of the chest: No pulmonary emboli are identified. A few scattered small nodules are seen as described. The largest of these measures 6-7 mm. Non-contrast chest CT at 3-6 months is recommended. If the nodules are  stable at time of repeat CT, then future CT at 18-24 months (from today's scan) is considered optional for low-risk patients, but is recommended for high-risk patients. This recommendation follows the consensus statement: Guidelines for Management of Incidental Pulmonary Nodules Detected on CT Images: From the Fleischner Society 2017; Radiology 2017; 284:228-243. CT the abdomen and pelvis: Changes consistent with mild pancreatitis. Electronically Signed   By: Alcide Clever M.D.   On: 07/02/2017 21:46   Ct Abdomen Pelvis W Contrast  Result Date: 07/02/2017 CLINICAL DATA:  Chest and abdominal pain EXAM: CT ANGIOGRAPHY CHEST CT ABDOMEN AND PELVIS WITH CONTRAST TECHNIQUE: Multidetector CT imaging of the chest was performed using the standard protocol during bolus administration of intravenous contrast. Multiplanar CT image reconstructions and MIPs were obtained to evaluate the vascular anatomy. Multidetector CT imaging of the abdomen and pelvis was performed using the standard protocol during bolus administration of intravenous contrast. CONTRAST:  ISOVUE-370 IOPAMIDOL (ISOVUE-370) INJECTION 76% COMPARISON:  None. FINDINGS: CTA CHEST FINDINGS Cardiovascular: Thoracic aorta is within normal limits. No aneurysmal dilatation or dissection is noted. No cardiac enlargement is seen. The pulmonary artery shows a normal branching pattern without intraluminal filling defect to suggest pulmonary embolism. No pericardial effusion is noted. Mediastinum/Nodes: No hilar or mediastinal adenopathy is identified. The thoracic inlet is within normal limits. The visualized esophagus is within normal limits. Lungs/Pleura: Lungs are well aerated bilaterally. No focal infiltrate or sizable effusion is seen. A 6 mm subpleural nodule is noted in the lingula best seen on image number 65 of series 6. A few scattered smaller nodules are identified in the left upper lobe and right middle lobe. Musculoskeletal: Mild degenerative changes  of the thoracic spine are noted. No acute bony abnormality is noted. Review of the MIP images confirms the above findings. CT ABDOMEN and PELVIS FINDINGS Hepatobiliary: No focal liver abnormality is seen. No gallstones, gallbladder wall thickening, or biliary dilatation. Pancreas: The pancreas is well visualized and demonstrates some mild peripancreatic inflammatory changes consistent with mild  pancreatitis. No significant phlegmon is noted. No cyst cysts are seen. No significant ductal dilatation is noted. No changes to suggest pancreatic necrosis are seen. Spleen: Normal in size without focal abnormality. Adrenals/Urinary Tract: Adrenal glands are unremarkable. Kidneys are normal, without renal calculi, focal lesion, or hydronephrosis. Bladder is unremarkable. Stomach/Bowel: No obstructive or inflammatory changes are identified. The appendix is within normal limits. Vascular/Lymphatic: No significant vascular findings are present. No enlarged abdominal or pelvic lymph nodes. Reproductive: Prostate is unremarkable. Other: No abdominal wall hernia or abnormality. No abdominopelvic ascites. Musculoskeletal: No acute or significant osseous findings. Review of the MIP images confirms the above findings. IMPRESSION: CTA of the chest: No pulmonary emboli are identified. A few scattered small nodules are seen as described. The largest of these measures 6-7 mm. Non-contrast chest CT at 3-6 months is recommended. If the nodules are stable at time of repeat CT, then future CT at 18-24 months (from today's scan) is considered optional for low-risk patients, but is recommended for high-risk patients. This recommendation follows the consensus statement: Guidelines for Management of Incidental Pulmonary Nodules Detected on CT Images: From the Fleischner Society 2017; Radiology 2017; 284:228-243. CT the abdomen and pelvis: Changes consistent with mild pancreatitis. Electronically Signed   By: Alcide CleverMark  Lukens M.D.   On: 07/02/2017  21:46    Procedures Procedures (including critical care time)  Medications Ordered in ED Medications  sodium chloride 0.9 % bolus 1,000 mL (0 mLs Intravenous Stopped 07/02/17 2014)  morphine 4 MG/ML injection 4 mg (4 mg Intravenous Given 07/02/17 1935)  ondansetron (ZOFRAN) injection 4 mg (4 mg Intravenous Given 07/02/17 1935)  famotidine (PEPCID) IVPB 20 mg premix (0 mg Intravenous Stopped 07/02/17 2014)  gi cocktail (Maalox,Lidocaine,Donnatal) (30 mLs Oral Given 07/02/17 1935)  HYDROmorphone (DILAUDID) injection 1 mg (1 mg Intravenous Given 07/02/17 2032)  iopamidol (ISOVUE-370) 76 % injection 100 mL (100 mLs Intravenous Contrast Given 07/02/17 2114)  HYDROmorphone (DILAUDID) injection 1 mg (1 mg Intravenous Given 07/02/17 2237)     Initial Impression / Assessment and Plan / ED Course  I have reviewed the triage vital signs and the nursing notes.  Pertinent labs & imaging results that were available during my care of the patient were reviewed by me and considered in my medical decision making (see chart for details).    Pecola LeisureRobert Skow is a 46 y.o. male here with epigastric pain. Hx of alcohol use. Consider alcohol gastritis vs pancreatitis vs GERD. Will get labs, CT ab/pel. Will give pepcid, GI cocktail and reassess.     10:56 PM Labs showed nl lipase. Initial chemistry showed hemolysis. Repeat showed nl K. Occasional hypoxia in the ED. CTA showed pulmonary nodules, no PE. Patient is on xarelto. CT ab/pel showed mild pancreatitis. Tolerated PO fluids in the ED. CBG improved. I reviewed previous admission record in 2017. At that time, he was thought to have OSA and recommend outpatient sleep study which he never done. When he walks, his oxygen is 94% but he does desat to 88-90% when laying down likely from OSA. Since he tolerated PO, has normal lipase level, will dc home with oxycodone, phenergan. Told him to stop drinking alcohol and get outpatient sleep study.   Final Clinical  Impressions(s) / ED Diagnoses   Final diagnoses:  None    ED Discharge Orders    None       Charlynne PanderYao, Sherre Wooton Hsienta, MD 07/02/17 2259

## 2017-07-02 NOTE — ED Notes (Signed)
Pt ambulated around the nursing station, Pt SpO2 94% and HR 84.

## 2017-07-02 NOTE — ED Triage Notes (Signed)
Patient states that he has had epigastric pain x 4 days. Denies any N/V or SOB

## 2017-07-02 NOTE — ED Notes (Signed)
Pt put on 2L [place]

## 2017-07-09 ENCOUNTER — Encounter (HOSPITAL_BASED_OUTPATIENT_CLINIC_OR_DEPARTMENT_OTHER): Payer: Self-pay

## 2017-07-09 ENCOUNTER — Emergency Department (HOSPITAL_BASED_OUTPATIENT_CLINIC_OR_DEPARTMENT_OTHER)
Admission: EM | Admit: 2017-07-09 | Discharge: 2017-07-09 | Disposition: A | Discharge status: Home / Self Care | Payer: Self-pay | Attending: Emergency Medicine | Admitting: Emergency Medicine

## 2017-07-09 ENCOUNTER — Other Ambulatory Visit: Payer: Self-pay

## 2017-07-09 DIAGNOSIS — I119 Hypertensive heart disease without heart failure: Secondary | ICD-10-CM | POA: Insufficient documentation

## 2017-07-09 DIAGNOSIS — Z7901 Long term (current) use of anticoagulants: Secondary | ICD-10-CM | POA: Insufficient documentation

## 2017-07-09 DIAGNOSIS — F17210 Nicotine dependence, cigarettes, uncomplicated: Secondary | ICD-10-CM | POA: Insufficient documentation

## 2017-07-09 DIAGNOSIS — Z7984 Long term (current) use of oral hypoglycemic drugs: Secondary | ICD-10-CM | POA: Insufficient documentation

## 2017-07-09 DIAGNOSIS — K859 Acute pancreatitis without necrosis or infection, unspecified: Secondary | ICD-10-CM

## 2017-07-09 DIAGNOSIS — K2900 Acute gastritis without bleeding: Secondary | ICD-10-CM

## 2017-07-09 DIAGNOSIS — Z7982 Long term (current) use of aspirin: Secondary | ICD-10-CM | POA: Insufficient documentation

## 2017-07-09 DIAGNOSIS — J45909 Unspecified asthma, uncomplicated: Secondary | ICD-10-CM | POA: Insufficient documentation

## 2017-07-09 DIAGNOSIS — I2510 Atherosclerotic heart disease of native coronary artery without angina pectoris: Secondary | ICD-10-CM | POA: Insufficient documentation

## 2017-07-09 DIAGNOSIS — E119 Type 2 diabetes mellitus without complications: Secondary | ICD-10-CM | POA: Insufficient documentation

## 2017-07-09 DIAGNOSIS — J449 Chronic obstructive pulmonary disease, unspecified: Secondary | ICD-10-CM | POA: Insufficient documentation

## 2017-07-09 DIAGNOSIS — Z7951 Long term (current) use of inhaled steroids: Secondary | ICD-10-CM | POA: Insufficient documentation

## 2017-07-09 DIAGNOSIS — K2970 Gastritis, unspecified, without bleeding: Secondary | ICD-10-CM | POA: Insufficient documentation

## 2017-07-09 DIAGNOSIS — Z79899 Other long term (current) drug therapy: Secondary | ICD-10-CM | POA: Insufficient documentation

## 2017-07-09 LAB — COMPREHENSIVE METABOLIC PANEL
ALBUMIN: 3.8 g/dL (ref 3.5–5.0)
ALT: 44 U/L (ref 17–63)
ANION GAP: 6 (ref 5–15)
AST: 33 U/L (ref 15–41)
Alkaline Phosphatase: 128 U/L — ABNORMAL HIGH (ref 38–126)
BILIRUBIN TOTAL: 0.5 mg/dL (ref 0.3–1.2)
BUN: 16 mg/dL (ref 6–20)
CHLORIDE: 95 mmol/L — AB (ref 101–111)
CO2: 29 mmol/L (ref 22–32)
Calcium: 9.1 mg/dL (ref 8.9–10.3)
Creatinine, Ser: 0.62 mg/dL (ref 0.61–1.24)
GFR calc Af Amer: >60 mL/min (ref >60)
Glucose, Bld: 396 mg/dL — ABNORMAL HIGH (ref 65–99)
POTASSIUM: 4.7 mmol/L (ref 3.5–5.1)
Sodium: 130 mmol/L — ABNORMAL LOW (ref 135–145)
TOTAL PROTEIN: 7.4 g/dL (ref 6.5–8.1)

## 2017-07-09 LAB — CBC WITH DIFFERENTIAL/PLATELET
BASOS ABS: 0.1 10*3/uL (ref 0.0–0.1)
Basophils Relative: 1 %
Eosinophils Absolute: 0.1 10*3/uL (ref 0.0–0.7)
Eosinophils Relative: 1 %
HEMATOCRIT: 48.1 % (ref 39.0–52.0)
HEMOGLOBIN: 17 g/dL (ref 13.0–17.0)
LYMPHS PCT: 20 %
Lymphs Abs: 2.1 10*3/uL (ref 0.7–4.0)
MCH: 31.3 pg (ref 26.0–34.0)
MCHC: 35.3 g/dL (ref 30.0–36.0)
MCV: 88.6 fL (ref 78.0–100.0)
Monocytes Absolute: 0.6 10*3/uL (ref 0.1–1.0)
Monocytes Relative: 6 %
NEUTROS ABS: 7.6 10*3/uL (ref 1.7–7.7)
Neutrophils Relative %: 72 %
Platelets: 291 10*3/uL (ref 150–400)
RBC: 5.43 MIL/uL (ref 4.22–5.81)
RDW: 13.4 % (ref 11.5–15.5)
WBC: 10.5 10*3/uL (ref 4.0–10.5)

## 2017-07-09 LAB — LIPASE, BLOOD: Lipase: 41 U/L (ref 11–51)

## 2017-07-09 MED ORDER — OXYCODONE-ACETAMINOPHEN 5-325 MG PO TABS: 1.0000 | ORAL_TABLET | ORAL | 0 refills | Status: DC | PRN

## 2017-07-09 MED ORDER — SUCRALFATE 1 G PO TABS: 1.0000 g | ORAL_TABLET | Freq: Three times a day (TID) | ORAL | 0 refills | Status: AC

## 2017-07-09 MED ORDER — OXYCODONE-ACETAMINOPHEN 5-325 MG PO TABS
2.0000 | ORAL_TABLET | Freq: Once | ORAL | Status: AC
  Administered 2017-07-09: 2 via ORAL
  Filled 2017-07-09: qty 2

## 2017-07-09 MED ORDER — GI COCKTAIL ~~LOC~~
30.0000 mL | Freq: Once | ORAL | Status: AC
  Administered 2017-07-09: 30 mL via ORAL
  Filled 2017-07-09: qty 30

## 2017-07-09 MED FILL — OXYCODONE-ACETAMINOPHEN 5-3: 5-325 | 2 days supply | Qty: 15 | Fill #0

## 2017-07-09 MED FILL — SUCRALFATE 1 GM TABLET: 1 | 8 days supply | Qty: 30 | Fill #0

## 2017-07-09 NOTE — ED Triage Notes (Signed)
Pt c/o cont'd abd pain -was seen here 11/19 dx with pancreatitis-NAD-steady gait

## 2017-07-09 NOTE — ED Provider Notes (Signed)
[facility] EMERGENCY DEPARTMENT Provider Note   CSN: [id number] Arrival date & time: 07/09/17  1114     History   Chief Complaint Chief Complaint  Patient presents with  . Abdominal Pain    HPI Jonmarc Bodkin is a 46 y.o. male.  Patient is a 46 year old with a history of obesity, asthma, diabetes, reflux and atrial fibrillation on Xarelto who presents with epigastric pain.  He was seen here on the 19th of this month and had a CT scan of his abdomen and pelvis which showed evidence of pancreatitis.  His lipase was normal.  He had ongoing pain to his epigastrium.  He denies any nausea and vomiting.  No fevers.  He is having normal bowel movements.  No blood in stool.  He is taking Protonix for his reflux and has been taking Percocet for his pain.  He states that he is been using a strict bland diet.  He is only eating small amounts at a time.  He has not had any alcohol in about 2 weeks.  He states his pain seems to be getting a little bit better and certainly is not getting worse but he is concerned because he ran out of his pain medication this morning and did not want to wait through the weekend.  His CT scan did not show evidence of gallbladder disease or gallstones.  He did show some pulmonary nodules and he knows that he needs to have a repeat CT scan in 3-6 months.  He is a smoker.  He also has some asthma and uses inhalers at home.  He however denies any shortness of breath currently.      Past Medical History:  Diagnosis Date  . Anxiety   . Asthma   . Atrial arrhythmia    multiples with 5-6 ablations  . Atrial fibrillation with rapid ventricular response (HCC)    With rates up to 200 as well as short PR tachycardia, cycle length of 240 ms.  . Chest tightness 05/08/14  . Diabetes mellitus, type II ([place])   . GERD (gastroesophageal reflux disease)   . Morbid obesity ([place])   . Palpitations 05/08/14  . Pneumonia ~ 2010  . Snores    Wil occasional apnea. Had a remote  sleep study but does not remember results.  . SOB (shortness of breath) 05/08/14  . SVT (supraventricular tachycardia) ([place])   . WPW (Wolff-Parkinson-White syndrome)     Patient Active Problem List   Diagnosis Date Noted  . Stroke-like symptoms 05/18/2016  . Smoker 05/18/2016  . COPD (chronic obstructive pulmonary disease) ([place]) 05/18/2016  . ETOH abuse 05/18/2016  . Pulmonary nodule, right 05/18/2016  . CAD-noted on chest CT 05/18/2016  . Family history of coronary artery disease in father 05/18/2016  . Cerebrovascular accident (CVA) due to thrombosis of right middle cerebral artery ([place])   . Mixed hyperlipidemia   . Long term current use of anticoagulant 08/16/2015  . Gastric catarrh 08/16/2015  . History of biliary T-tube placement 08/16/2015  . Personal history of other diseases of the circulatory system 08/16/2015  . Polycythemia, secondary 08/16/2015  . Paroxysmal atrial fibrillation ([place]) 07/18/2015  . Morbid obesity ([place]) 07/18/2015  . Essential hypertension 07/18/2015  . Type 2 diabetes mellitus without complication, without long-term current use of insulin ([place]) 05/10/2014  . History of PSVT (paroxysmal supraventricular tachycardia) 05/09/2014  . WPW (Wolff-Parkinson-White syndrome) 05/09/2014  . Avascular necrosis of bone of hip ([place]) 07/12/2013  . Acid reflux 07/12/2013  .  Arthralgia of hip 07/12/2013  . Chest pain of unknown etiology 05/01/2013    Past Surgical History:  Procedure Laterality Date  . ATRIAL FIBRILLATION ABLATION     "& WPW"  . CARDIAC CATHETERIZATION     "6 or 7" (05/18/2016)  . WRIST FRACTURE SURGERY Right 1990s       Home Medications    Prior to Admission medications   Medication Sig Start Date End Date Taking? Authorizing Provider  alprazolam Duanne Moron) 2 MG tablet Take 2 mg by mouth 2 (two) times daily.     [provider]  amiodarone (PACERONE) 200 MG tablet TAKE 1 TABLET ONCE DAILY. 12/14/16   Evans Lance, MD  aspirin EC 81  MG tablet Take 1 tablet (81 mg total) by mouth daily. 05/19/16   Geradine Girt, DO  atorvastatin (LIPITOR) 20 MG tablet Take 1 tablet (20 mg total) by mouth daily at 6 PM. 05/19/16   Geradine Girt, DO  budesonide-formoterol (SYMBICORT) 160-4.5 MCG/ACT inhaler Inhale 2 puffs into the lungs daily as needed (shortness of breath).     [provider]  cetirizine (ZYRTEC) 10 MG tablet Take 10 mg by mouth daily.    [provider]  diltiazem (CARDIZEM CD) 180 MG 24 hr capsule Take 180 mg by mouth daily.     [provider]  esomeprazole (NEXIUM) 40 MG capsule Take 1 capsule (40 mg total) daily by mouth. 07/02/17   Drenda Freeze, MD  ipratropium (ATROVENT) 0.02 % nebulizer solution Take 250 mcg by nebulization every 4 (four) hours as needed. As needed for shortness of breath    [provider]  levalbuterol (XOPENEX HFA) 45 MCG/ACT inhaler Inhale 1-2 puffs into the lungs every 6 (six) hours as needed for wheezing or shortness of breath.    [provider]  metFORMIN (GLUCOPHAGE) 1000 MG tablet Take 1 tablet by mouth 2 (two) times daily. 08/17/15   [provider]  montelukast (SINGULAIR) 10 MG tablet Take 10 mg by mouth every morning.     [provider]  multivitamin-iron-minerals-folic acid (CENTRUM) chewable tablet Chew 1 tablet by mouth daily.    [provider]  nicotine (NICODERM CQ - DOSED IN MG/24 HOURS) 14 mg/24hr patch Place 1 patch (14 mg total) onto the skin daily. 05/20/16   Geradine Girt, DO  oxyCODONE-acetaminophen (PERCOCET) 5-325 MG tablet Take 1-2 tablets by mouth every 4 (four) hours as needed. 07/09/17   Malvin Johns, MD  oxyCODONE-acetaminophen (PERCOCET) 7.5-325 MG tablet Take 1 tablet every 6 (six) hours as needed by mouth for severe pain. 07/02/17   Drenda Freeze, MD  promethazine (PHENERGAN) 25 MG tablet Take 1 tablet (25 mg total) every 6 (six) hours as needed by mouth for nausea or vomiting.  07/02/17   Drenda Freeze, MD  rivaroxaban (XARELTO) 20 MG TABS tablet Take 20 mg by mouth every morning.  07/18/15   Evans Lance, MD  sucralfate (CARAFATE) 1 g tablet Take 1 tablet (1 g total) by mouth 4 (four) times daily -  with meals and at bedtime. 07/09/17   Malvin Johns, MD  traMADol (ULTRAM) 50 MG tablet Take 50 mg by mouth as needed for moderate pain or severe pain.    [provider]  UNABLE TO FIND Outpatient PT/OT dx: TIA eval and treat 05/19/16   Geradine Girt, DO    Family History Family History  Problem Relation Age of Onset  . Clotting disorder Father   .  Diabetes Father   . Heart attack Father 7  . Hypertension Father     Social History Social History   Tobacco Use  . Smoking status: Current Every Day Smoker    Packs/day: 1.00    Years: 30.00    Pack years: 30.00  . Smokeless tobacco: Never Used  Substance Use Topics  . Alcohol use: No    Frequency: Never    Comment: none x 2 weeks  . Drug use: No     Allergies   Prednisone   Review of Systems Review of Systems  Constitutional: Positive for appetite change. Negative for chills, diaphoresis, fatigue and fever.  HENT: Negative for congestion, rhinorrhea and sneezing.   Eyes: Negative.   Respiratory: Negative for cough, chest tightness and shortness of breath.   Cardiovascular: Negative for chest pain and leg swelling.  Gastrointestinal: Positive for abdominal pain. Negative for blood in stool, diarrhea, nausea and vomiting.  Genitourinary: Negative for difficulty urinating, flank pain, frequency and hematuria.  Musculoskeletal: Negative for arthralgias and back pain.  Skin: Negative for rash.  Neurological: Negative for dizziness, speech difficulty, weakness, numbness and headaches.     Physical Exam Updated Vital Signs BP (!) 173/109 (BP Location: Left Arm)   Pulse 81   Temp 98 F (36.7 C) (Oral)   Resp 20   Ht 6' 6"  (1.981 m)   Wt (!) 158 kg (348 lb 5.2 oz)   SpO2 96%    BMI 40.25 kg/m   Physical Exam  Constitutional: He is oriented to person, place, and time. He appears well-developed and well-nourished.  Obese  HENT:  Head: Normocephalic and atraumatic.  Eyes: Pupils are equal, round, and reactive to light.  Neck: Normal range of motion. Neck supple.  Cardiovascular: Normal rate, regular rhythm and normal heart sounds.  Pulmonary/Chest: Effort normal. No respiratory distress. He has wheezes. He has no rales. He exhibits no tenderness.  Abdominal: Soft. Bowel sounds are normal. There is tenderness in the epigastric area. There is no rebound and no guarding.  Musculoskeletal: Normal range of motion. He exhibits no edema.  Lymphadenopathy:    He has no cervical adenopathy.  Neurological: He is alert and oriented to person, place, and time.  Skin: Skin is warm and dry. No rash noted.  Psychiatric: He has a normal mood and affect.     ED Treatments / Results  Labs (all labs ordered are listed, but only abnormal results are displayed) Labs Reviewed  COMPREHENSIVE METABOLIC PANEL - Abnormal; Notable for the following components:      Result Value   Sodium 130 (*)    Chloride 95 (*)    Glucose, Bld 396 (*)    Alkaline Phosphatase 128 (*)    All other components within normal limits  CBC WITH DIFFERENTIAL/PLATELET  LIPASE, BLOOD    EKG  EKG Interpretation None       Radiology No results found.  Procedures Procedures (including critical care time)  Medications Ordered in ED Medications  oxyCODONE-acetaminophen (PERCOCET/ROXICET) 5-325 MG per tablet 2 tablet (not administered)  gi cocktail (Maalox,Lidocaine,Donnatal) (30 mLs Oral Given 07/09/17 1221)     Initial Impression / Assessment and Plan / ED Course  I have reviewed the triage vital signs and the nursing notes.  Pertinent labs & imaging results that were available during my care of the patient were reviewed by me and considered in my medical decision making (see chart for  details).     Patient is a 46 year old male who  presents with abdominal pain consistent with his pancreatitis that he was diagnosed with a few days ago.  His symptoms are slowly improving but he is out of his pain medication.  His labs are non-concerning.  I did advise him that his glucose is elevated.  This is likely resulting in some pseudohyponatremia.  His lipase is normal.  There is no evidence of gallstones or gallbladder disease on his CT scan.  His LFTs are normal other than his alk phos.  He does feel little bit better after GI cocktail.  He was also given a dose of Percocet in the ED.  I did advise him I can give him a one-time refill on his pain medication and he needs to follow-up with his PCP.  I also started him on Carafate.  He is continuing to take Protonix.  Return precautions were given.  Final Clinical Impressions(s) / ED Diagnoses   Final diagnoses:  Acute superficial gastritis without hemorrhage  Acute pancreatitis without infection or necrosis, unspecified pancreatitis type    ED Discharge Orders        Ordered    oxyCODONE-acetaminophen (PERCOCET) 5-325 MG tablet  Every 4 hours PRN     07/09/17 1331    sucralfate (CARAFATE) 1 g tablet  3 times daily with meals & bedtime     07/09/17 1331       Malvin Johns, MD 07/09/17 1333

## 2017-10-01 IMAGING — CT CT ANGIO NECK
1 of 8 series · 6 of 33 positions shown · IV contrast (OMNI 350)
Comparison: 05/17/2016 CT of the head.

CLINICAL DATA: 45 y/o  M; code stroke with right arm weakness.

EXAM:
CT ANGIOGRAPHY HEAD AND NECK
TECHNIQUE: Multidetector CT imaging of the head and neck was performed using
the standard protocol during bolus administration of intravenous
contrast. Multiplanar CT image reconstructions and MIPs were
obtained to evaluate the vascular anatomy. Carotid stenosis
measurements (when applicable) are obtained utilizing NASCET
criteria, using the distal internal carotid diameter as the
denominator. Manually triggered contrast bolus due to insufficient
opacification of the aorta for automatic triggering.
CONTRAST:  70 cc Isovue 370.

[Series 4: cta neck axial · axial · 0.43mm/px · z∈[-332,-77]mm · 6 of 359 slices shown]
[im 52/359  soft-tissue]
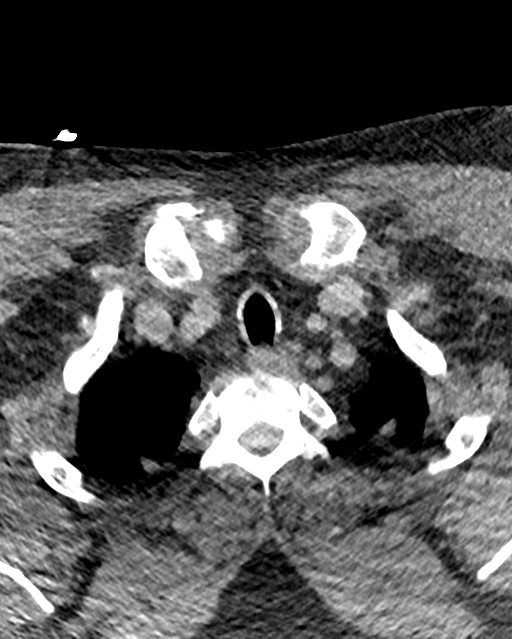
[im 103/359  bone]
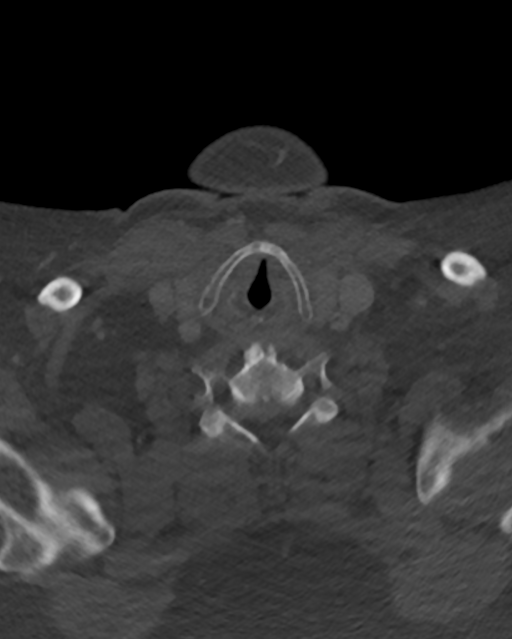
[im 154/359  soft-tissue]
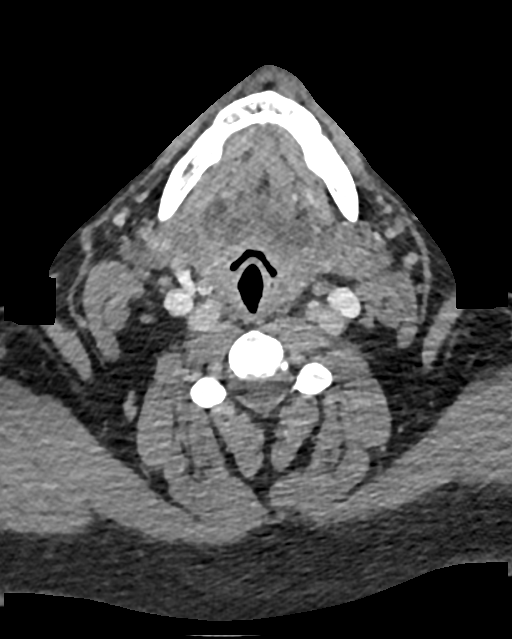
[im 205/359  bone]
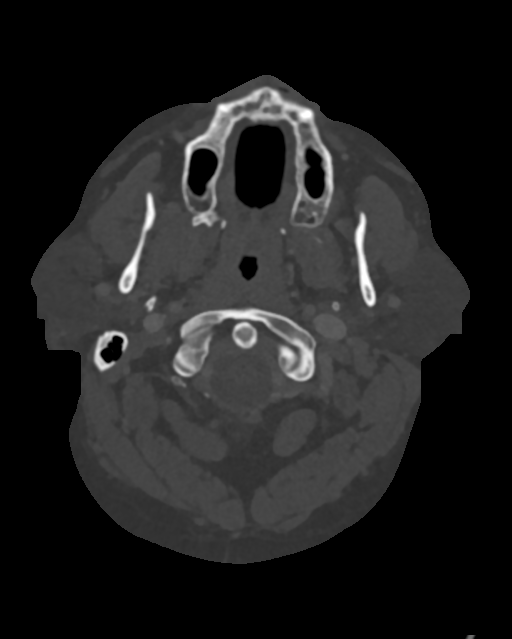
[im 256/359  soft-tissue]
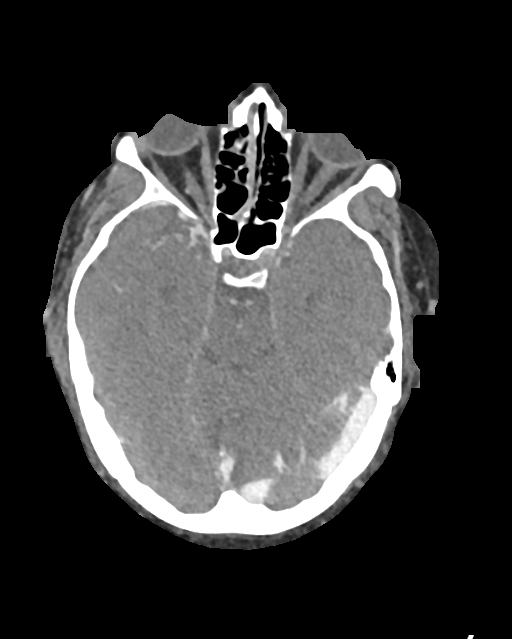
[im 307/359  bone]
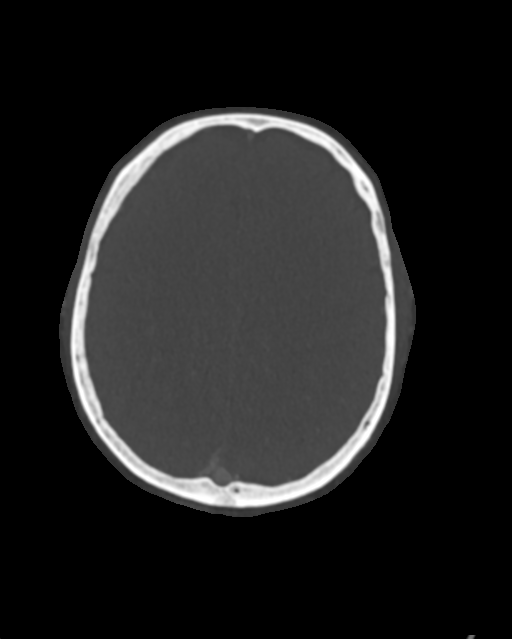

[6 of 33 positions shown; findings below may reference images not displayed]

FINDINGS: CTA NECK FINDINGS

Early venous phase acquisition. Suboptimal assessment for small
aneurysm or stenosis.

Aortic arch: Standard branching. Imaged portion shows no evidence of
aneurysm or dissection. No significant stenosis of the major arch
vessel origins.

Right carotid system: Patent.  No high-grade stenosis.

Left carotid system: Patent.  No high-grade stenosis.

Vertebral arteries: Patent.

Skeleton: Multilevel cervical spondylosis greatest from C5 through
C7 with there are endplate degenerative changes and marginal
osteophytes. No high-grade bony canal stenosis or foraminal
narrowing.

Other neck: None.

Upper chest: Clear appear

Review of the MIP images confirms the above findings

CTA HEAD FINDINGS

Early venous phase acquisition. Suboptimal assessment for small
aneurysm or stenosis.

Anterior circulation: Bilateral M1 and A1 segments are patent.
Distal anterior and middle cerebral artery circulation appears
symmetric. Internal carotid arteries are obscured at the level of
the distal cavernous segment due to artifact from bone. Otherwise
the internal carotid arteries are patent.

Posterior circulation: Bilateral vertebral arteries, the basilar
artery, and bilateral posterior cerebral arteries are patent.

Venous sinuses: As permitted by contrast timing, patent.

Anatomic variants: No definite posterior communicating artery is
identified, likely hypoplastic or absent.

Review of the MIP images confirms the above findings
IMPRESSION: Early venous phase acquisition. Suboptimal assessment for small
aneurysm or stenosis.

1. Patent carotid and vertebral arteries of the neck.
2. Patent circle of Willis.

By: Tom Rune Moum M.D.

## 2017-10-02 IMAGING — CT CT HEAD W/O CM
3 of 4 series · 17 of 47 positions shown, 20 images · non-contrast
Comparison: CT exams 05/17/2016 and 05/18/2016.

CLINICAL DATA: Left-sided weakness.  Follow-up stroke.

EXAM:
CT HEAD WITHOUT CONTRAST
TECHNIQUE: Contiguous axial images were obtained from the base of the skull
through the vertex without intravenous contrast.

[Series 201: head w/o, idose (1) · axial · non-contrast · 0.49mm/px · z∈[+82,+212]mm · 11 of 32 slices shown, 14 images]
[im 3/32  brain]
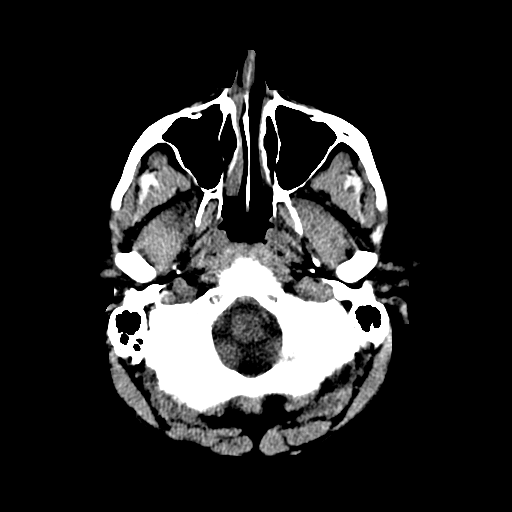
[im 3/32  bone]
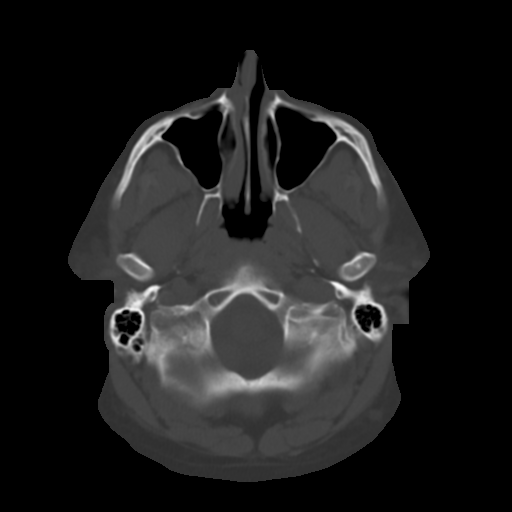
[im 5/32  brain]
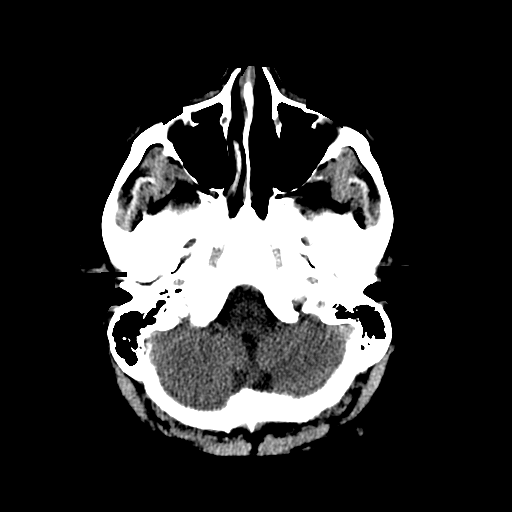
[im 7/32  brain]
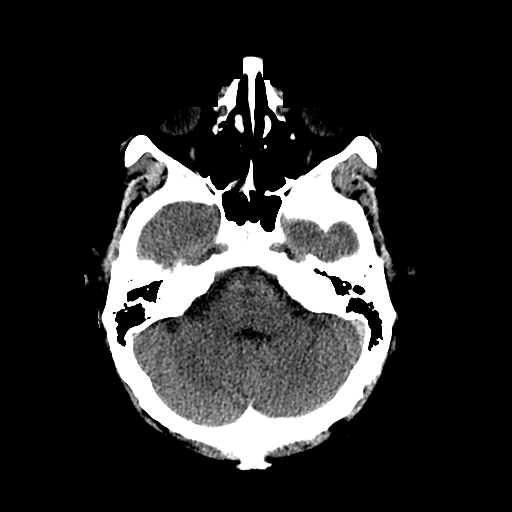
[im 12/32  brain]
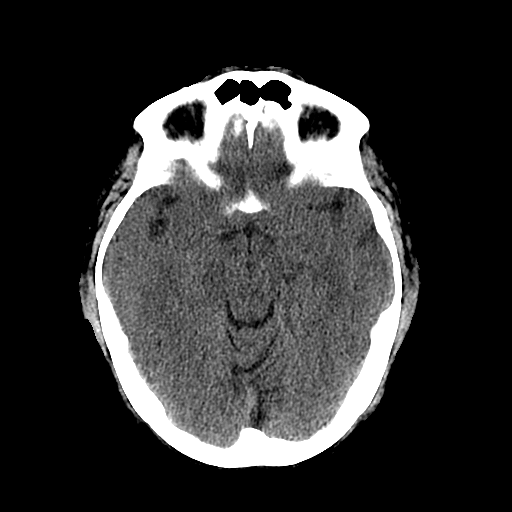
[im 14/32  brain]
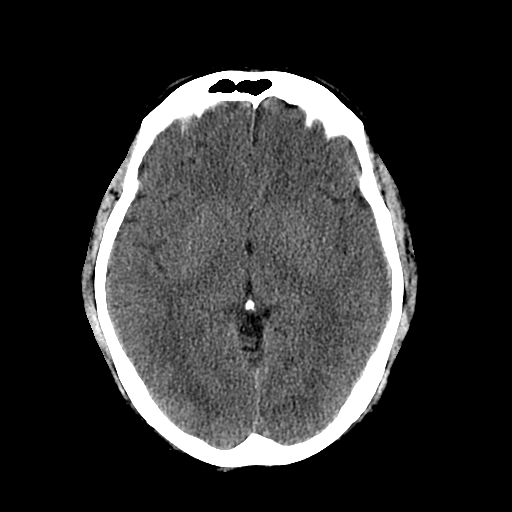
[im 14/32  bone]
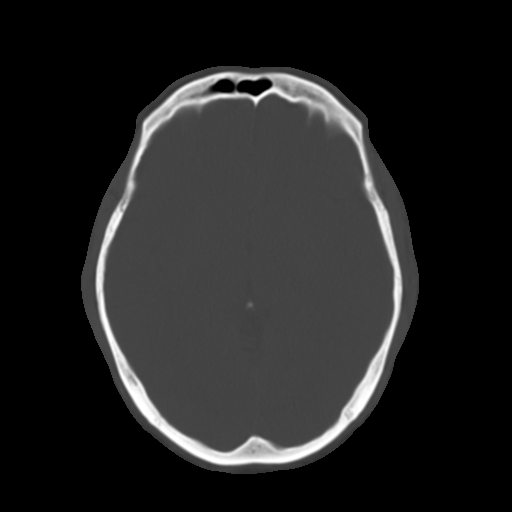
[im 16/32  brain]
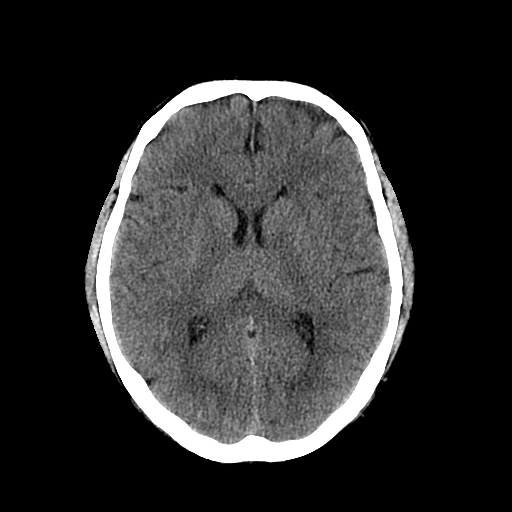
[im 18/32  brain]
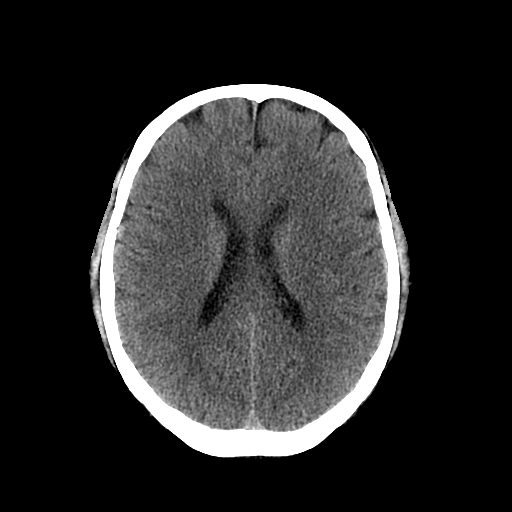
[im 20/32  brain]
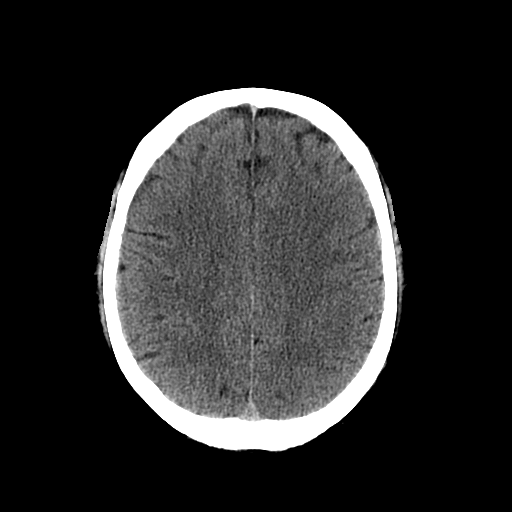
[im 25/32  brain]
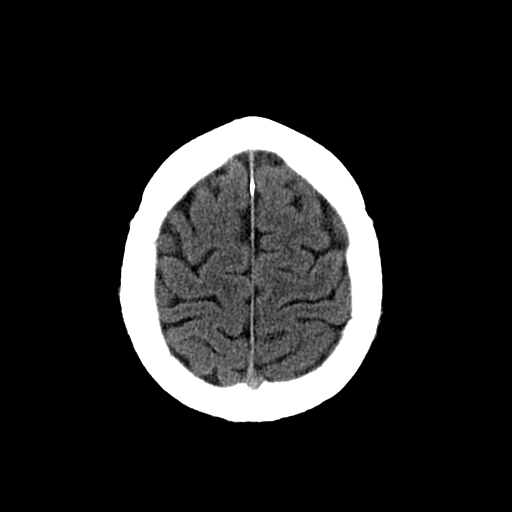
[im 25/32  bone]
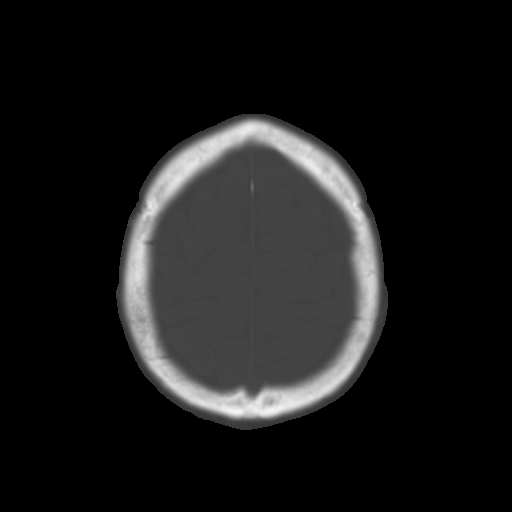
[im 27/32  brain]
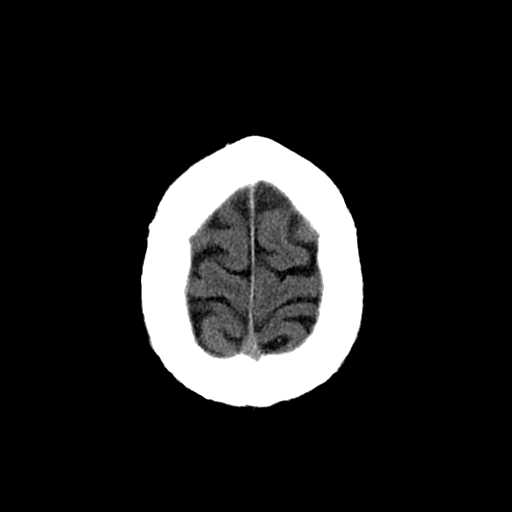
[im 29/32  brain]
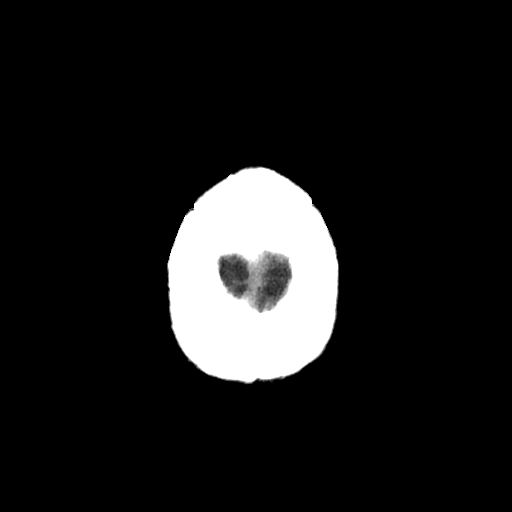

[Series 203: coronal st, idose (1) · coronal · 0.40mm/px · 3 of 81 slices shown]
[im 27/81  brain]
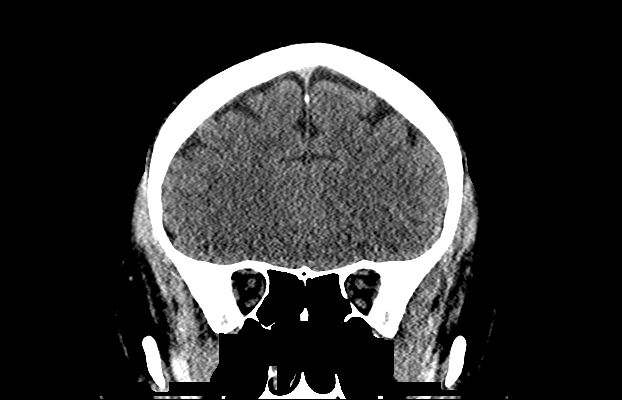
[im 36/81  brain]
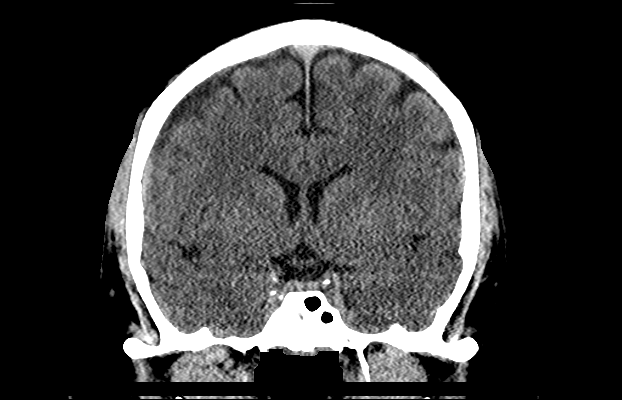
[im 45/81  brain]
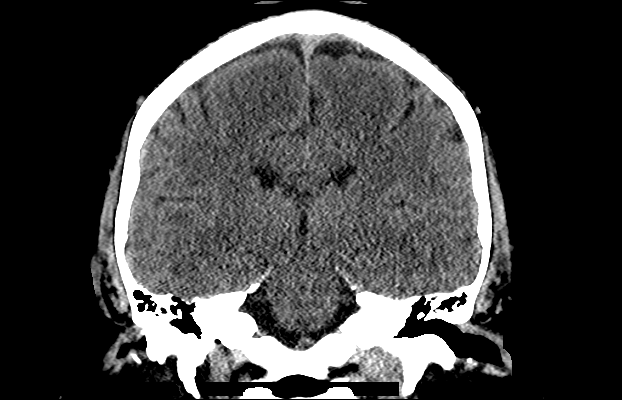

[Series 204: sagittal st, idose (1) · sagittal · 0.40mm/px · 3 of 83 slices shown]
[im 28/83  brain]
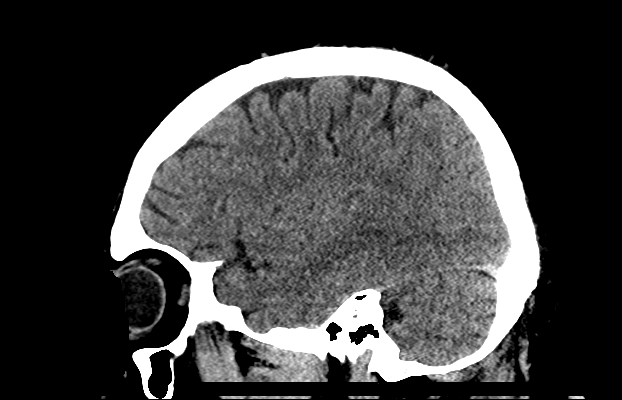
[im 42/83  brain]
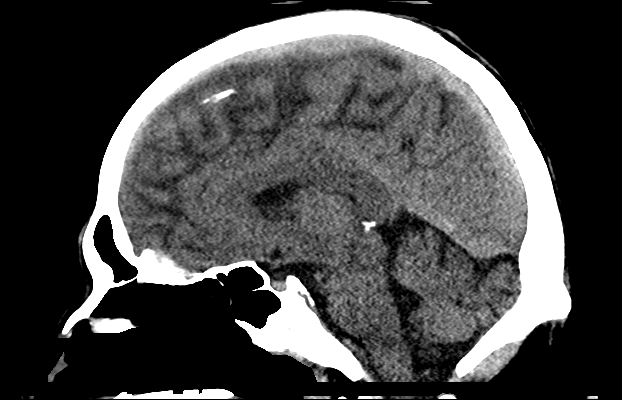
[im 55/83  brain]
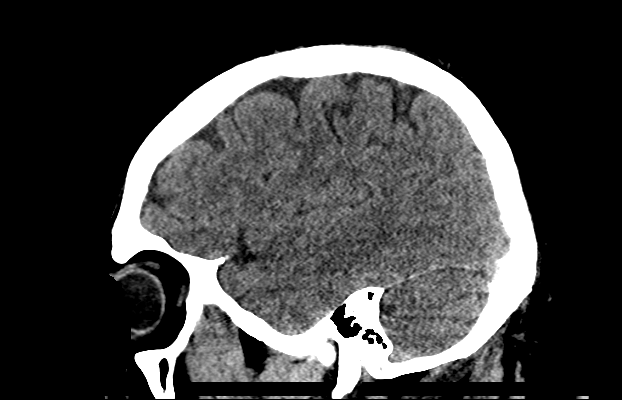

[17 of 47 positions shown; findings below may reference images not displayed]

FINDINGS: Brain: No evidence of malformation, atrophy, old or acute small or
large vessel infarction, mass lesion, hemorrhage, hydrocephalus or
extra-axial collection. No evidence of pituitary lesion.

Vascular: No vascular calcification.  No hyperdense vessels.

Skull: Normal.  No fracture or focal bone lesion.

Sinuses/Orbits: Visualized sinuses are clear. No fluid in the middle
ears or mastoids. Visualized orbits are normal.

Other: None significant
IMPRESSION: Normal head CT

## 2019-08-18 DIAGNOSIS — I214 Non-ST elevation (NSTEMI) myocardial infarction: Secondary | ICD-10-CM

## 2019-08-18 HISTORY — DX: Non-ST elevation (NSTEMI) myocardial infarction: I21.4

## 2019-10-07 ENCOUNTER — Encounter (HOSPITAL_BASED_OUTPATIENT_CLINIC_OR_DEPARTMENT_OTHER): Payer: Self-pay | Admitting: Emergency Medicine

## 2019-10-07 ENCOUNTER — Other Ambulatory Visit: Payer: Self-pay

## 2019-10-07 ENCOUNTER — Emergency Department (HOSPITAL_BASED_OUTPATIENT_CLINIC_OR_DEPARTMENT_OTHER): Payer: BC Managed Care – PPO

## 2019-10-07 ENCOUNTER — Emergency Department (HOSPITAL_BASED_OUTPATIENT_CLINIC_OR_DEPARTMENT_OTHER)
Admission: EM | Admit: 2019-10-07 | Discharge: 2019-10-07 | Disposition: A | Discharge status: Home / Self Care | Payer: BC Managed Care – PPO | Attending: Emergency Medicine | Admitting: Emergency Medicine

## 2019-10-07 DIAGNOSIS — Z79899 Other long term (current) drug therapy: Secondary | ICD-10-CM | POA: Diagnosis not present

## 2019-10-07 DIAGNOSIS — J449 Chronic obstructive pulmonary disease, unspecified: Secondary | ICD-10-CM | POA: Insufficient documentation

## 2019-10-07 DIAGNOSIS — F17210 Nicotine dependence, cigarettes, uncomplicated: Secondary | ICD-10-CM | POA: Insufficient documentation

## 2019-10-07 DIAGNOSIS — E119 Type 2 diabetes mellitus without complications: Secondary | ICD-10-CM | POA: Diagnosis not present

## 2019-10-07 DIAGNOSIS — I10 Essential (primary) hypertension: Secondary | ICD-10-CM | POA: Diagnosis not present

## 2019-10-07 DIAGNOSIS — Z794 Long term (current) use of insulin: Secondary | ICD-10-CM | POA: Diagnosis not present

## 2019-10-07 DIAGNOSIS — Z888 Allergy status to other drugs, medicaments and biological substances status: Secondary | ICD-10-CM | POA: Insufficient documentation

## 2019-10-07 DIAGNOSIS — Z8673 Personal history of transient ischemic attack (TIA), and cerebral infarction without residual deficits: Secondary | ICD-10-CM | POA: Diagnosis not present

## 2019-10-07 DIAGNOSIS — I4891 Unspecified atrial fibrillation: Secondary | ICD-10-CM | POA: Diagnosis not present

## 2019-10-07 DIAGNOSIS — R1032 Left lower quadrant pain: Secondary | ICD-10-CM | POA: Diagnosis present

## 2019-10-07 DIAGNOSIS — E782 Mixed hyperlipidemia: Secondary | ICD-10-CM | POA: Insufficient documentation

## 2019-10-07 DIAGNOSIS — K861 Other chronic pancreatitis: Secondary | ICD-10-CM

## 2019-10-07 HISTORY — DX: Acute pancreatitis without necrosis or infection, unspecified: K85.90

## 2019-10-07 LAB — COMPREHENSIVE METABOLIC PANEL
ALT: 23 U/L (ref 0–44)
AST: 16 U/L (ref 15–41)
Albumin: 4.1 g/dL (ref 3.5–5.0)
Alkaline Phosphatase: 122 U/L (ref 38–126)
Anion gap: 11 (ref 5–15)
BUN: 13 mg/dL (ref 6–20)
CO2: 26 mmol/L (ref 22–32)
Calcium: 9.1 mg/dL (ref 8.9–10.3)
Chloride: 94 mmol/L — ABNORMAL LOW (ref 98–111)
Creatinine, Ser: 0.9 mg/dL (ref 0.61–1.24)
GFR calc Af Amer: >60 mL/min (ref >60)
GFR calc non Af Amer: >60 mL/min (ref >60)
Glucose, Bld: 343 mg/dL — ABNORMAL HIGH (ref 70–99)
Potassium: 4.2 mmol/L (ref 3.5–5.1)
Sodium: 131 mmol/L — ABNORMAL LOW (ref 135–145)
Total Bilirubin: 0.3 mg/dL (ref 0.3–1.2)
Total Protein: 7.7 g/dL (ref 6.5–8.1)

## 2019-10-07 LAB — CBC WITH DIFFERENTIAL/PLATELET
Abs Immature Granulocytes: 0.07 10*3/uL (ref 0.00–0.07)
Basophils Absolute: 0.1 10*3/uL (ref 0.0–0.1)
Basophils Relative: 0 %
Eosinophils Absolute: 0.2 10*3/uL (ref 0.0–0.5)
Eosinophils Relative: 1 %
HCT: 50.7 % (ref 39.0–52.0)
Hemoglobin: 17.5 g/dL — ABNORMAL HIGH (ref 13.0–17.0)
Immature Granulocytes: 0 %
Lymphocytes Relative: 20 %
Lymphs Abs: 3.4 10*3/uL (ref 0.7–4.0)
MCH: 30.2 pg (ref 26.0–34.0)
MCHC: 34.5 g/dL (ref 30.0–36.0)
MCV: 87.6 fL (ref 80.0–100.0)
Monocytes Absolute: 1.2 10*3/uL — ABNORMAL HIGH (ref 0.1–1.0)
Monocytes Relative: 7 %
Neutro Abs: 11.8 10*3/uL — ABNORMAL HIGH (ref 1.7–7.7)
Neutrophils Relative %: 72 %
Platelets: 323 10*3/uL (ref 150–400)
RBC: 5.79 MIL/uL (ref 4.22–5.81)
RDW: 12.7 % (ref 11.5–15.5)
WBC: 16.7 10*3/uL — ABNORMAL HIGH (ref 4.0–10.5)
nRBC: 0 % (ref 0.0–0.2)

## 2019-10-07 LAB — URINALYSIS, MICROSCOPIC (REFLEX)

## 2019-10-07 LAB — URINALYSIS, ROUTINE W REFLEX MICROSCOPIC
Bilirubin Urine: NEGATIVE
Glucose, UA: >=500 mg/dL — AB
Hgb urine dipstick: NEGATIVE
Ketones, ur: NEGATIVE mg/dL
Leukocytes,Ua: NEGATIVE
Nitrite: NEGATIVE
Protein, ur: NEGATIVE mg/dL
Specific Gravity, Urine: 1.01 (ref 1.005–1.030)
pH: 6 (ref 5.0–8.0)

## 2019-10-07 LAB — LIPASE, BLOOD: Lipase: 28 U/L (ref 11–51)

## 2019-10-07 MED ORDER — IOHEXOL 300 MG/ML  SOLN
100.0000 mL | Freq: Once | INTRAMUSCULAR | Status: AC | PRN
  Administered 2019-10-07: 100 mL via INTRAVENOUS

## 2019-10-07 MED ORDER — ALUM & MAG HYDROXIDE-SIMETH 200-200-20 MG/5ML PO SUSP
30.0000 mL | Freq: Once | ORAL | Status: AC
  Administered 2019-10-07: 30 mL via ORAL
  Filled 2019-10-07: qty 30

## 2019-10-07 MED ORDER — KETOROLAC TROMETHAMINE 30 MG/ML IJ SOLN
30.0000 mg | Freq: Once | INTRAMUSCULAR | Status: AC
  Administered 2019-10-07: 30 mg via INTRAVENOUS
  Filled 2019-10-07: qty 1

## 2019-10-07 MED ORDER — DICYCLOMINE HCL 10 MG/ML IM SOLN
20.0000 mg | Freq: Once | INTRAMUSCULAR | Status: AC
  Administered 2019-10-07: 20 mg via INTRAMUSCULAR
  Filled 2019-10-07: qty 2

## 2019-10-07 MED ORDER — DICYCLOMINE HCL 20 MG PO TABS: 20.0000 mg | ORAL_TABLET | Freq: Two times a day (BID) | ORAL | 0 refills | Status: DC

## 2019-10-07 NOTE — ED Provider Notes (Signed)
[facility] EMERGENCY DEPARTMENT Provider Note   CSN: [id number] Arrival date & time: 10/07/19  0038     History Chief Complaint  Patient presents with   Abdominal Pain    Dean [name] is a 49 y.o. male.  The history is provided by the patient.  Abdominal Pain Pain location:  LLQ Pain quality: cramping   Pain radiates to:  Does not radiate Pain severity:  Severe Onset quality:  Gradual Duration:  1 day Timing:  Constant Progression:  Unchanged Chronicity:  New Context: laxative use   Context comment:  Mag citrate for OI constipation  Relieved by:  Nothing Worsened by:  Nothing Ineffective treatments:  None tried Associated symptoms: no anorexia, no belching, no chest pain, no chills, no constipation, no cough, no diarrhea, no dysuria, no fatigue, no fever, no flatus, no hematemesis, no hematochezia, no hematuria, no melena, no nausea, no shortness of breath, no sore throat, no vaginal bleeding and no vaginal discharge   Associated symptoms comment:  One episode of emesis immediately following the mag citrate.   Risk factors: has not had multiple surgeries        Past Medical History:  Diagnosis Date   Anxiety    Asthma    Atrial arrhythmia    multiples with 5-6 ablations   Atrial fibrillation with rapid ventricular response (HCC)    With rates up to 200 as well as short PR tachycardia, cycle length of 240 ms.   Chest tightness 05/08/14   Diabetes mellitus, type II ([place])    GERD (gastroesophageal reflux disease)    Morbid obesity ([place])    Palpitations 05/08/14   Pancreatitis    Pneumonia ~ 2010   Snores    Wil occasional apnea. Had a remote sleep study but does not remember results.   SOB (shortness of breath) 05/08/14   SVT (supraventricular tachycardia) (HCC)    WPW (Wolff-Parkinson-White syndrome)     Patient Active Problem List   Diagnosis Date Noted   Stroke-like symptoms 05/18/2016   Smoker 05/18/2016   COPD (chronic  obstructive pulmonary disease) ([place]) 05/18/2016   ETOH abuse 05/18/2016   Pulmonary nodule, right 05/18/2016   CAD-noted on chest CT 05/18/2016   Family history of coronary artery disease in father 05/18/2016   Cerebrovascular accident (CVA) due to thrombosis of right middle cerebral artery ([place])    Mixed hyperlipidemia    Long term current use of anticoagulant 08/16/2015   Gastric catarrh 08/16/2015   History of biliary T-tube placement 08/16/2015   Personal history of other diseases of the circulatory system 08/16/2015   Polycythemia, secondary 08/16/2015   Paroxysmal atrial fibrillation ([place]) 07/18/2015   Morbid obesity ([place]) 07/18/2015   Essential hypertension 07/18/2015   Type 2 diabetes mellitus without complication, without long-term current use of insulin ([place]) 05/10/2014   History of PSVT (paroxysmal supraventricular tachycardia) 05/09/2014   WPW (Wolff-Parkinson-White syndrome) 05/09/2014   Avascular necrosis of bone of hip ([place]) 07/12/2013   Acid reflux 07/12/2013   Arthralgia of hip 07/12/2013   Chest pain of unknown etiology 05/01/2013    Past Surgical History:  Procedure Laterality Date   ATRIAL FIBRILLATION ABLATION     "& WPW"   CARDIAC CATHETERIZATION     "6 or 7" (05/18/2016)   WRIST FRACTURE SURGERY Right 1990s       Family History  Problem Relation Age of Onset   Clotting disorder Father    Diabetes Father    Heart attack Father 76   Hypertension  Father     Social History   Tobacco Use   Smoking status: Current Every Day Smoker    Packs/day: 1.00    Years: 30.00    Pack years: 30.00   Smokeless tobacco: Never Used  Substance Use Topics   Alcohol use: No    Comment: none x 2 weeks   Drug use: No    Home Medications Prior to Admission medications   Medication Sig Start Date End Date Taking? Authorizing Provider  alprazolam Prudy Feeler) 2 MG tablet Take 2 mg by mouth 2 (two) times daily.     [provider]  amiodarone (PACERONE) 200 MG tablet TAKE 1 TABLET ONCE DAILY. 12/14/16   Marinus Maw, MD  aspirin EC 81 MG tablet Take 1 tablet (81 mg total) by mouth daily. 05/19/16   Joseph Art, DO  atorvastatin (LIPITOR) 20 MG tablet Take 1 tablet (20 mg total) by mouth daily at 6 PM. 05/19/16   Joseph Art, DO  budesonide-formoterol (SYMBICORT) 160-4.5 MCG/ACT inhaler Inhale 2 puffs into the lungs daily as needed (shortness of breath).     [provider]  cetirizine (ZYRTEC) 10 MG tablet Take 10 mg by mouth daily.    [provider]  dicyclomine (BENTYL) 20 MG tablet Take 1 tablet (20 mg total) by mouth 2 (two) times daily. 10/07/19   Eldar Robitaille, MD  diltiazem (CARDIZEM CD) 180 MG 24 hr capsule Take 180 mg by mouth daily.     [provider]  esomeprazole (NEXIUM) 40 MG capsule Take 1 capsule (40 mg total) daily by mouth. 07/02/17   Charlynne Pander, MD  ipratropium (ATROVENT) 0.02 % nebulizer solution Take 250 mcg by nebulization every 4 (four) hours as needed. As needed for shortness of breath    [provider]  levalbuterol (XOPENEX HFA) 45 MCG/ACT inhaler Inhale 1-2 puffs into the lungs every 6 (six) hours as needed for wheezing or shortness of breath.    [provider]  metFORMIN (GLUCOPHAGE) 1000 MG tablet Take 1 tablet by mouth 2 (two) times daily. 08/17/15   [provider]  montelukast (SINGULAIR) 10 MG tablet Take 10 mg by mouth every morning.     [provider]  multivitamin-iron-minerals-folic acid (CENTRUM) chewable tablet Chew 1 tablet by mouth daily.    [provider]  nicotine (NICODERM CQ - DOSED IN MG/24 HOURS) 14 mg/24hr patch Place 1 patch (14 mg total) onto the skin daily. 05/20/16   Joseph Art, DO  oxyCODONE-acetaminophen (PERCOCET) 5-325 MG tablet Take 1-2 tablets by mouth every 4 (four) hours as needed. 07/09/17   Rolan Bucco, MD  oxyCODONE-acetaminophen (PERCOCET) 7.5-325 MG tablet Take  1 tablet every 6 (six) hours as needed by mouth for severe pain. 07/02/17   Charlynne Pander, MD  pantoprazole (PROTONIX) 40 MG tablet Take 40 mg by mouth daily. 09/26/19   [provider]  promethazine (PHENERGAN) 25 MG tablet Take 1 tablet (25 mg total) every 6 (six) hours as needed by mouth for nausea or vomiting. 07/02/17   Charlynne Pander, MD  rivaroxaban (XARELTO) 20 MG TABS tablet Take 20 mg by mouth every morning.  07/18/15   Marinus Maw, MD  sucralfate (CARAFATE) 1 g tablet Take 1 tablet (1 g total) by mouth 4 (four) times daily -  with meals and at bedtime. 07/09/17   Rolan Bucco, MD  traMADol (ULTRAM) 50 MG tablet Take 50 mg by mouth as needed for moderate pain  or severe pain.    [provider]  UNABLE TO FIND Outpatient PT/OT dx: TIA eval and treat 05/19/16   Joseph Art, DO    Allergies    Prednisone  Review of Systems   Review of Systems  Constitutional: Negative for chills, fatigue and fever.  HENT: Negative for sore throat.   Eyes: Negative for visual disturbance.  Respiratory: Negative for cough and shortness of breath.   Cardiovascular: Negative for chest pain.  Gastrointestinal: Positive for abdominal pain. Negative for anorexia, constipation, diarrhea, flatus, hematemesis, hematochezia, melena and nausea.  Genitourinary: Negative for dysuria, hematuria, vaginal bleeding and vaginal discharge.  Musculoskeletal: Negative for arthralgias.  Skin: Negative for color change.  Neurological: Negative for dizziness.  Psychiatric/Behavioral: Negative for agitation.  All other systems reviewed and are negative.   Physical Exam Updated Vital Signs BP 135/81 (BP Location: Right Arm)    Pulse 72    Temp 97.9 F (36.6 C) (Oral)    Resp 18    Ht 6\' 2"  (1.88 m)    Wt (!) 158 kg    SpO2 96%    BMI 44.72 kg/m   Physical Exam Vitals and nursing note reviewed.  Constitutional:      Appearance: He is not ill-appearing.  HENT:     Head: Normocephalic  and atraumatic.     Nose: Nose normal.  Eyes:     Conjunctiva/sclera: Conjunctivae normal.     Pupils: Pupils are equal, round, and reactive to light.  Cardiovascular:     Rate and Rhythm: Normal rate and regular rhythm.     Pulses: Normal pulses.     Heart sounds: Normal heart sounds.  Pulmonary:     Effort: Pulmonary effort is normal.     Breath sounds: Normal breath sounds.  Abdominal:     General: Abdomen is flat.     Tenderness: There is no abdominal tenderness. There is no guarding or rebound.     Comments: Gassy   Musculoskeletal:        General: Normal range of motion.     Cervical back: Normal range of motion and neck supple.  Skin:    General: Skin is warm and dry.     Capillary Refill: Capillary refill takes less than 2 seconds.  Neurological:     General: No focal deficit present.     Mental Status: He is alert and oriented to person, place, and time.     Deep Tendon Reflexes: Reflexes normal.  Psychiatric:        Mood and Affect: Mood normal.        Behavior: Behavior normal.     ED Results / Procedures / Treatments   Labs (all labs ordered are listed, but only abnormal results are displayed) Results for orders placed or performed during the hospital encounter of 10/07/19  CBC with Differential/Platelet  Result Value Ref Range   WBC 16.7 (H) 4.0 - 10.5 K/uL   RBC 5.79 4.22 - 5.81 MIL/uL   Hemoglobin 17.5 (H) 13.0 - 17.0 g/dL   HCT 10/09/19 84.6 - 96.2 %   MCV 87.6 80.0 - 100.0 fL   MCH 30.2 26.0 - 34.0 pg   MCHC 34.5 30.0 - 36.0 g/dL   RDW 95.2 84.1 - 32.4 %   Platelets 323 150 - 400 K/uL   nRBC 0.0 0.0 - 0.2 %   Neutrophils Relative % 72 %   Neutro Abs 11.8 (H) 1.7 - 7.7 K/uL   Lymphocytes Relative 20 %  Lymphs Abs 3.4 0.7 - 4.0 K/uL   Monocytes Relative 7 %   Monocytes Absolute 1.2 (H) 0.1 - 1.0 K/uL   Eosinophils Relative 1 %   Eosinophils Absolute 0.2 0.0 - 0.5 K/uL   Basophils Relative 0 %   Basophils Absolute 0.1 0.0 - 0.1 K/uL   Immature  Granulocytes 0 %   Abs Immature Granulocytes 0.07 0.00 - 0.07 K/uL  Comprehensive metabolic panel  Result Value Ref Range   Sodium 131 (L) 135 - 145 mmol/L   Potassium 4.2 3.5 - 5.1 mmol/L   Chloride 94 (L) 98 - 111 mmol/L   CO2 26 22 - 32 mmol/L   Glucose, Bld 343 (H) 70 - 99 mg/dL   BUN 13 6 - 20 mg/dL   Creatinine, Ser 1.910.90 0.61 - 1.24 mg/dL   Calcium 9.1 8.9 - 47.810.3 mg/dL   Total Protein 7.7 6.5 - 8.1 g/dL   Albumin 4.1 3.5 - 5.0 g/dL   AST 16 15 - 41 U/L   ALT 23 0 - 44 U/L   Alkaline Phosphatase 122 38 - 126 U/L   Total Bilirubin 0.3 0.3 - 1.2 mg/dL   GFR calc non Af Amer >60 >60 mL/min   GFR calc Af Amer >60 >60 mL/min   Anion gap 11 5 - 15  Lipase, blood  Result Value Ref Range   Lipase 28 11 - 51 U/L  Urinalysis, Routine w reflex microscopic  Result Value Ref Range   Color, Urine YELLOW YELLOW   APPearance CLEAR CLEAR   Specific Gravity, Urine 1.010 1.005 - 1.030   pH 6.0 5.0 - 8.0   Glucose, UA >=500 (A) NEGATIVE mg/dL   Hgb urine dipstick NEGATIVE NEGATIVE   Bilirubin Urine NEGATIVE NEGATIVE   Ketones, ur NEGATIVE NEGATIVE mg/dL   Protein, ur NEGATIVE NEGATIVE mg/dL   Nitrite NEGATIVE NEGATIVE   Leukocytes,Ua NEGATIVE NEGATIVE  Urinalysis, Microscopic (reflex)  Result Value Ref Range   RBC / HPF 0-5 0 - 5 RBC/hpf   WBC, UA 0-5 0 - 5 WBC/hpf   Bacteria, UA RARE (A) NONE SEEN   Squamous Epithelial / LPF 0-5 0 - 5   CT ABDOMEN PELVIS W CONTRAST  Result Date: 10/07/2019 CLINICAL DATA:  Left lower quadrant abdominal pain over the last few days. EXAM: CT ABDOMEN AND PELVIS WITH CONTRAST TECHNIQUE: Multidetector CT imaging of the abdomen and pelvis was performed using the standard protocol following bolus administration of intravenous contrast. CONTRAST:  100mL OMNIPAQUE IOHEXOL 300 MG/ML  SOLN COMPARISON:  05/18/2016.  Report from study 07/02/2017. FINDINGS: Lower chest: Normal Hepatobiliary: Normal Pancreas: Mild edema of fat around the head and proximal body of the  pancreas suggesting early pancreatitis. Spleen: Normal Adrenals/Urinary Tract: Adrenal glands are normal. Kidneys are normal. Bladder is normal. Stomach/Bowel: Stomach and small bowel are normal. Appendix is normal. Normal amount of fecal matter. No evidence diverticulosis or diverticulitis. Vascular/Lymphatic: Aortic atherosclerosis. No aneurysm. IVC is normal. No retroperitoneal adenopathy. Reproductive: Normal Other: No free fluid or air. Musculoskeletal: Chronic lower lumbar degenerative changes. IMPRESSION: Mild edema in the region of the pancreatic head and proximal body suggesting early pancreatitis. I note that the lipase is normal, which is difficult to explain. A report from a CT done 07/02/2017 describes similar findings, at which time the lipase was also normal. Therefore, this may not represent a significant finding but simply represents some residua of previous disease, presently inactive. Lower lumbar degenerative changes. Otherwise negative study. Electronically Signed   By: Loraine LericheMark  Shogry M.D.   On: 10/07/2019 02:16    EKG None  Radiology CT ABDOMEN PELVIS W CONTRAST  Result Date: 10/07/2019 CLINICAL DATA:  Left lower quadrant abdominal pain over the last few days. EXAM: CT ABDOMEN AND PELVIS WITH CONTRAST TECHNIQUE: Multidetector CT imaging of the abdomen and pelvis was performed using the standard protocol following bolus administration of intravenous contrast. CONTRAST:  OMNIPAQUE IOHEXOL 300 MG/ML  SOLN COMPARISON:  05/18/2016.  Report from study 07/02/2017. FINDINGS: Lower chest: Normal Hepatobiliary: Normal Pancreas: Mild edema of fat around the head and proximal body of the pancreas suggesting early pancreatitis. Spleen: Normal Adrenals/Urinary Tract: Adrenal glands are normal. Kidneys are normal. Bladder is normal. Stomach/Bowel: Stomach and small bowel are normal. Appendix is normal. Normal amount of fecal matter. No evidence diverticulosis or diverticulitis.  Vascular/Lymphatic: Aortic atherosclerosis. No aneurysm. IVC is normal. No retroperitoneal adenopathy. Reproductive: Normal Other: No free fluid or air. Musculoskeletal: Chronic lower lumbar degenerative changes. IMPRESSION: Mild edema in the region of the pancreatic head and proximal body suggesting early pancreatitis. I note that the lipase is normal, which is difficult to explain. A report from a CT done 07/02/2017 describes similar findings, at which time the lipase was also normal. Therefore, this may not represent a significant finding but simply represents some residua of previous disease, presently inactive. Lower lumbar degenerative changes. Otherwise negative study. Electronically Signed   By: Paulina Fusi M.D.   On: 10/07/2019 02:16    Procedures Procedures (including critical care time)  Medications Ordered in ED Medications  ketorolac (TORADOL) 30 MG/ML injection 30 mg (30 mg Intravenous Given 10/07/19 0126)  iohexol (OMNIPAQUE) 300 MG/ML solution 100 mL (100 mLs Intravenous Contrast Given 10/07/19 0148)  alum & mag hydroxide-simeth (MAALOX/MYLANTA) 200-200-20 MG/5ML suspension 30 mL (30 mLs Oral Given 10/07/19 0232)  dicyclomine (BENTYL) injection 20 mg (20 mg Intramuscular Given 10/07/19 0235)    ED Course  I have reviewed the triage vital signs and the nursing notes.  Pertinent labs & imaging results that were available during my care of the patient were reviewed by me and considered in my medical decision making (see chart for details).  Pain is in the LLQ not consistent with the chronic pancreatitis seen on CT.  I suspect it was the gas and cramping from the mag citrate the patient took.  There is no UTI. No diverticulitis, no stones.  The patient appears comfortable and is better post medication.  He is on home narcotics for chronic pain.  PO challenged successfully in the ED.  Stable for discharge with close follow up.    Dean [name] was evaluated in Emergency Department on  10/07/2019 for the symptoms described in the history of present illness. He was evaluated in the context of the global COVID-19 pandemic, which necessitated consideration that the patient might be at risk for infection with the SARS-CoV-2 virus that causes COVID-19. Institutional protocols and algorithms that pertain to the evaluation of patients at risk for COVID-19 are in a state of rapid change based on information released by regulatory bodies including the CDC and federal and state organizations. These policies and algorithms were followed during the patient's care in the ED.  Final Clinical Impression(s) / ED Diagnoses Final diagnoses:  Chronic pancreatitis, unspecified pancreatitis type (HCC)  Left lower quadrant abdominal pain   Return for weakness, numbness, changes in vision or speech, fevers >100.4 unrelieved by medication, shortness of breath, intractable vomiting, or diarrhea, abdominal pain, Inability to tolerate liquids or food, cough,  altered mental status or any concerns. No signs of systemic illness or infection. The patient is nontoxic-appearing on exam and vital signs are within normal limits.   I have reviewed the triage vital signs and the nursing notes. Pertinent labs &imaging results that were available during my care of the patient were reviewed by me and considered in my medical decision making (see chart for details).  After history, exam, and medical workup I feel the patient has been appropriately medically screened and is safe for discharge home. Pertinent diagnoses were discussed with the patient. Patient was given return precautions    Rx / DC Orders ED Discharge Orders         Ordered    dicyclomine (BENTYL) 20 MG tablet  2 times daily     10/07/19 0327           Masey Scheiber, MD 10/07/19 [phone]

## 2019-10-07 NOTE — ED Triage Notes (Signed)
Patient presents with complaints of mid upper abd pain onset 2-3 days; states took 2 enemas in the last 2 days and drank mag citrate yesterday; denies NVD.

## 2019-11-14 ENCOUNTER — Inpatient Hospital Stay (HOSPITAL_COMMUNITY)
Admission: EM | Admit: 2019-11-14 | Discharge: 2019-11-17 | DRG: 247 | Disposition: A | Discharge status: Home / Self Care | Payer: BC Managed Care – PPO | Attending: Cardiovascular Disease | Admitting: Cardiovascular Disease

## 2019-11-14 ENCOUNTER — Other Ambulatory Visit: Payer: Self-pay

## 2019-11-14 ENCOUNTER — Encounter (HOSPITAL_COMMUNITY): Payer: Self-pay | Admitting: Emergency Medicine

## 2019-11-14 ENCOUNTER — Emergency Department (HOSPITAL_COMMUNITY): Payer: BC Managed Care – PPO

## 2019-11-14 DIAGNOSIS — J449 Chronic obstructive pulmonary disease, unspecified: Secondary | ICD-10-CM | POA: Diagnosis present

## 2019-11-14 DIAGNOSIS — E119 Type 2 diabetes mellitus without complications: Secondary | ICD-10-CM | POA: Diagnosis present

## 2019-11-14 DIAGNOSIS — J4520 Mild intermittent asthma, uncomplicated: Secondary | ICD-10-CM | POA: Diagnosis not present

## 2019-11-14 DIAGNOSIS — Z955 Presence of coronary angioplasty implant and graft: Secondary | ICD-10-CM

## 2019-11-14 DIAGNOSIS — E782 Mixed hyperlipidemia: Secondary | ICD-10-CM | POA: Diagnosis present

## 2019-11-14 DIAGNOSIS — Z7951 Long term (current) use of inhaled steroids: Secondary | ICD-10-CM | POA: Diagnosis not present

## 2019-11-14 DIAGNOSIS — Z720 Tobacco use: Secondary | ICD-10-CM | POA: Diagnosis not present

## 2019-11-14 DIAGNOSIS — I25110 Atherosclerotic heart disease of native coronary artery with unstable angina pectoris: Secondary | ICD-10-CM | POA: Diagnosis present

## 2019-11-14 DIAGNOSIS — I214 Non-ST elevation (NSTEMI) myocardial infarction: Secondary | ICD-10-CM | POA: Diagnosis present

## 2019-11-14 DIAGNOSIS — Z7982 Long term (current) use of aspirin: Secondary | ICD-10-CM | POA: Diagnosis not present

## 2019-11-14 DIAGNOSIS — E668 Other obesity: Secondary | ICD-10-CM

## 2019-11-14 DIAGNOSIS — F17210 Nicotine dependence, cigarettes, uncomplicated: Secondary | ICD-10-CM | POA: Diagnosis present

## 2019-11-14 DIAGNOSIS — Z832 Family history of diseases of the blood and blood-forming organs and certain disorders involving the immune mechanism: Secondary | ICD-10-CM

## 2019-11-14 DIAGNOSIS — E1165 Type 2 diabetes mellitus with hyperglycemia: Secondary | ICD-10-CM | POA: Diagnosis not present

## 2019-11-14 DIAGNOSIS — R0789 Other chest pain: Secondary | ICD-10-CM | POA: Diagnosis not present

## 2019-11-14 DIAGNOSIS — Z8249 Family history of ischemic heart disease and other diseases of the circulatory system: Secondary | ICD-10-CM

## 2019-11-14 DIAGNOSIS — R062 Wheezing: Secondary | ICD-10-CM | POA: Diagnosis not present

## 2019-11-14 DIAGNOSIS — I480 Paroxysmal atrial fibrillation: Secondary | ICD-10-CM | POA: Diagnosis not present

## 2019-11-14 DIAGNOSIS — Z888 Allergy status to other drugs, medicaments and biological substances status: Secondary | ICD-10-CM

## 2019-11-14 DIAGNOSIS — I10 Essential (primary) hypertension: Secondary | ICD-10-CM | POA: Diagnosis present

## 2019-11-14 DIAGNOSIS — I456 Pre-excitation syndrome: Secondary | ICD-10-CM | POA: Diagnosis present

## 2019-11-14 DIAGNOSIS — K861 Other chronic pancreatitis: Secondary | ICD-10-CM | POA: Diagnosis present

## 2019-11-14 DIAGNOSIS — Z8673 Personal history of transient ischemic attack (TIA), and cerebral infarction without residual deficits: Secondary | ICD-10-CM

## 2019-11-14 DIAGNOSIS — E785 Hyperlipidemia, unspecified: Secondary | ICD-10-CM | POA: Diagnosis not present

## 2019-11-14 DIAGNOSIS — F17200 Nicotine dependence, unspecified, uncomplicated: Secondary | ICD-10-CM | POA: Diagnosis present

## 2019-11-14 DIAGNOSIS — K219 Gastro-esophageal reflux disease without esophagitis: Secondary | ICD-10-CM | POA: Diagnosis present

## 2019-11-14 DIAGNOSIS — Z20822 Contact with and (suspected) exposure to covid-19: Secondary | ICD-10-CM | POA: Diagnosis present

## 2019-11-14 DIAGNOSIS — I2510 Atherosclerotic heart disease of native coronary artery without angina pectoris: Secondary | ICD-10-CM | POA: Diagnosis not present

## 2019-11-14 DIAGNOSIS — Z7901 Long term (current) use of anticoagulants: Secondary | ICD-10-CM | POA: Diagnosis not present

## 2019-11-14 DIAGNOSIS — Z794 Long term (current) use of insulin: Secondary | ICD-10-CM

## 2019-11-14 DIAGNOSIS — Z833 Family history of diabetes mellitus: Secondary | ICD-10-CM | POA: Diagnosis not present

## 2019-11-14 DIAGNOSIS — I4892 Unspecified atrial flutter: Secondary | ICD-10-CM | POA: Diagnosis present

## 2019-11-14 DIAGNOSIS — R079 Chest pain, unspecified: Secondary | ICD-10-CM | POA: Diagnosis not present

## 2019-11-14 HISTORY — DX: Non-ST elevation (NSTEMI) myocardial infarction: I21.4

## 2019-11-14 LAB — CBC
HCT: 49.3 % (ref 39.0–52.0)
Hemoglobin: 16.5 g/dL (ref 13.0–17.0)
MCH: 30.2 pg (ref 26.0–34.0)
MCHC: 33.5 g/dL (ref 30.0–36.0)
MCV: 90.3 fL (ref 80.0–100.0)
Platelets: 354 10*3/uL (ref 150–400)
RBC: 5.46 MIL/uL (ref 4.22–5.81)
RDW: 13 % (ref 11.5–15.5)
WBC: 14.3 10*3/uL — ABNORMAL HIGH (ref 4.0–10.5)
nRBC: 0 % (ref 0.0–0.2)

## 2019-11-14 LAB — BASIC METABOLIC PANEL
Anion gap: 11 (ref 5–15)
BUN: 13 mg/dL (ref 6–20)
CO2: 26 mmol/L (ref 22–32)
Calcium: 9.8 mg/dL (ref 8.9–10.3)
Chloride: 100 mmol/L (ref 98–111)
Creatinine, Ser: 0.78 mg/dL (ref 0.61–1.24)
GFR calc Af Amer: >60 mL/min (ref >60)
GFR calc non Af Amer: >60 mL/min (ref >60)
Glucose, Bld: 205 mg/dL — ABNORMAL HIGH (ref 70–99)
Potassium: 4.7 mmol/L (ref 3.5–5.1)
Sodium: 137 mmol/L (ref 135–145)

## 2019-11-14 LAB — SARS CORONAVIRUS 2 (TAT 6-24 HRS): SARS Coronavirus 2: NEGATIVE

## 2019-11-14 LAB — TROPONIN I (HIGH SENSITIVITY)
Troponin I (High Sensitivity): 114 ng/L (ref <18)
Troponin I (High Sensitivity): 129 ng/L (ref <18)

## 2019-11-14 MED ORDER — NITROGLYCERIN 0.4 MG SL SUBL
0.4000 mg | SUBLINGUAL_TABLET | SUBLINGUAL | Status: DC | PRN
  Administered 2019-11-14: 0.4 mg via SUBLINGUAL
  Filled 2019-11-14: qty 1

## 2019-11-14 MED ORDER — LEVALBUTEROL HCL 0.63 MG/3ML IN NEBU
0.6300 mg | INHALATION_SOLUTION | Freq: Once | RESPIRATORY_TRACT | Status: AC
  Administered 2019-11-14: 0.63 mg via RESPIRATORY_TRACT
  Filled 2019-11-14: qty 3

## 2019-11-14 MED ORDER — SODIUM CHLORIDE 0.9% FLUSH
3.0000 mL | Freq: Two times a day (BID) | INTRAVENOUS | Status: DC
  Administered 2019-11-14 – 2019-11-16 (×2): 3 mL via INTRAVENOUS

## 2019-11-14 MED ORDER — HEPARIN (PORCINE) 25000 UT/250ML-% IV SOLN
1650.0000 [IU]/h | INTRAVENOUS | Status: DC
  Administered 2019-11-14: 1450 [IU]/h via INTRAVENOUS
  Filled 2019-11-14: qty 250

## 2019-11-14 MED ORDER — ASPIRIN 81 MG PO CHEW
324.0000 mg | CHEWABLE_TABLET | Freq: Once | ORAL | Status: AC
  Administered 2019-11-14: 324 mg via ORAL
  Filled 2019-11-14: qty 4

## 2019-11-14 NOTE — ED Provider Notes (Addendum)
[facility] EMERGENCY DEPARTMENT Provider Note   CSN: [id number] Arrival date & time: 11/14/19  1350     History Chief Complaint  Patient presents with  . Chest Pain    Dean [name] is a 49 y.o. male with medical history significant for A. fib with RVR (On Eliquis), Wolff-Parkinson-White, anxiety, asthma, chronic pancreatitis, obesity, tobacco abuse, DM who presents for evaluation of Chest pain. Pain began on Saturday, 4 days PTA.  Described as tightness.  States pain has radiated to the left arm however not left jaw.  Initially had 2 episodes of exertional chest pain however last night he had an episode which woke him up from sleep.  Described as pressure to his chest.  States she does get associated nausea and shortness of breath however no diaphoresis with this.  Does admits to prior tobacco use and hypercholesterolemia however no prior history of hypertension.  Pain does not radiate into his back.  No unilateral leg swelling, redness, warmth, malignancy, recent surgery or immobilization.  No prior history of PE or DVT.  Denies any illicit drug use.  He rates his current pain a 5/10.  Denies fever, chills hemoptysis, abdominal pain, diarrhea, dysuria.  Denies aggravating relieving factors.  Seen by PCP yesterday and had troponin which was elevated.  Per patient PCP contacted Dr. Elberta Fortis with cardiology who was told he needed to come to the emergency department for evaluation given elevated troponin.  No prior history of ACS per patient.    History obtained from patient past medical record.  No interpreter is used.  HPI  HPI: A 49 year old patient with a history of treated diabetes and hypercholesterolemia presents for evaluation of chest pain. Initial onset of pain was less than one hour ago. The patient's chest pain is described as heaviness/pressure/tightness and is worse with exertion. The patient complains of nausea. The patient's chest pain is middle- or left-sided, is  not well-localized, is not sharp and does radiate to the arms/jaw/neck. The patient denies diaphoresis. The patient has smoked in the past 90 days. The patient has no history of stroke, has no history of peripheral artery disease, has no relevant family history of coronary artery disease (first degree relative at less than age 64), is not hypertensive and does not have an elevated BMI (>=30).   Past Medical History:  Diagnosis Date  . Anxiety   . Asthma   . Atrial arrhythmia    multiples with 5-6 ablations  . Atrial fibrillation with rapid ventricular response (HCC)    With rates up to 200 as well as short PR tachycardia, cycle length of 240 ms.  . Chest tightness 05/08/14  . Diabetes mellitus, type II (HCC)   . GERD (gastroesophageal reflux disease)   . Morbid obesity (HCC)   . Palpitations 05/08/14  . Pancreatitis   . Pneumonia ~ 2010  . Snores    Wil occasional apnea. Had a remote sleep study but does not remember results.  . SOB (shortness of breath) 05/08/14  . SVT (supraventricular tachycardia) (HCC)   . WPW (Wolff-Parkinson-White syndrome)     Patient Active Problem List   Diagnosis Date Noted  . NSTEMI (non-ST elevated myocardial infarction) (HCC) 11/14/2019  . Stroke-like symptoms 05/18/2016  . Smoker 05/18/2016  . COPD (chronic obstructive pulmonary disease) (HCC) 05/18/2016  . ETOH abuse 05/18/2016  . Pulmonary nodule, right 05/18/2016  . CAD-noted on chest CT 05/18/2016  . Family history of coronary artery disease in father 05/18/2016  . Cerebrovascular accident (  CVA) due to thrombosis of right middle cerebral artery (HCC)   . Mixed hyperlipidemia   . Long term current use of anticoagulant 08/16/2015  . Gastric catarrh 08/16/2015  . History of biliary T-tube placement 08/16/2015  . Personal history of other diseases of the circulatory system 08/16/2015  . Polycythemia, secondary 08/16/2015  . Paroxysmal atrial fibrillation (HCC) 07/18/2015  . Morbid obesity (HCC)  07/18/2015  . Essential hypertension 07/18/2015  . Type 2 diabetes mellitus without complication, without long-term current use of insulin (HCC) 05/10/2014  . History of PSVT (paroxysmal supraventricular tachycardia) 05/09/2014  . WPW (Wolff-Parkinson-White syndrome) 05/09/2014  . Avascular necrosis of bone of hip (HCC) 07/12/2013  . Acid reflux 07/12/2013  . Arthralgia of hip 07/12/2013  . Chest pain of unknown etiology 05/01/2013    Past Surgical History:  Procedure Laterality Date  . ATRIAL FIBRILLATION ABLATION     "& WPW"  . CARDIAC CATHETERIZATION     "6 or 7" (05/18/2016)  . WRIST FRACTURE SURGERY Right 1990s       Family History  Problem Relation Age of Onset  . Clotting disorder Father   . Diabetes Father   . Heart attack Father 69  . Hypertension Father     Social History   Tobacco Use  . Smoking status: Current Every Day Smoker    Packs/day: 1.00    Years: 30.00    Pack years: 30.00  . Smokeless tobacco: Never Used  Substance Use Topics  . Alcohol use: No    Comment: none x 2 weeks  . Drug use: No    Home Medications Prior to Admission medications   Medication Sig Start Date End Date Taking? Authorizing Provider  alprazolam Prudy Feeler) 2 MG tablet Take 2 mg by mouth 2 (two) times daily.    Yes [provider]  amiodarone (PACERONE) 200 MG tablet TAKE 1 TABLET ONCE DAILY. Patient taking differently: Take 200 mg by mouth daily.  12/14/16  Yes Marinus Maw, MD  Apixaban (ELIQUIS PO) Take 5 mg by mouth daily.   Yes [provider]  aspirin EC 81 MG tablet Take 1 tablet (81 mg total) by mouth daily. 05/19/16  Yes Vann, Jessica U, DO  atorvastatin (LIPITOR) 20 MG tablet Take 1 tablet (20 mg total) by mouth daily at 6 PM. 05/19/16  Yes Vann, Jessica U, DO  budesonide-formoterol (SYMBICORT) 160-4.5 MCG/ACT inhaler Inhale 2 puffs into the lungs daily as needed (shortness of breath).    Yes [provider]  cetirizine (ZYRTEC) 10 MG tablet  Take 10 mg by mouth daily.   Yes [provider]  diltiazem (CARDIZEM CD) 180 MG 24 hr capsule Take 180 mg by mouth daily.    Yes [provider]  insulin lispro (HUMALOG) 100 UNIT/ML injection Inject 8 Units into the skin See admin instructions. Per sliding scale.   Yes [provider]  levalbuterol (XOPENEX HFA) 45 MCG/ACT inhaler Inhale 1-2 puffs into the lungs every 6 (six) hours as needed for wheezing or shortness of breath.   Yes [provider]  metFORMIN (GLUCOPHAGE) 1000 MG tablet Take 1 tablet by mouth 2 (two) times daily. 08/17/15  Yes [provider]  montelukast (SINGULAIR) 10 MG tablet Take 10 mg by mouth every morning.    Yes [provider]  multivitamin-iron-minerals-folic acid (CENTRUM) chewable tablet Chew 1 tablet by mouth daily.   Yes [provider]  pantoprazole (PROTONIX) 40 MG tablet Take 40 mg by mouth daily. 09/26/19  Yes  [provider]  sucralfate (CARAFATE) 1 g tablet Take 1 tablet (1 g total) by mouth 4 (four) times daily -  with meals and at bedtime. Patient taking differently: Take 1 g by mouth 2 (two) times daily.  07/09/17  Yes Rolan Bucco, MD  dicyclomine (BENTYL) 20 MG tablet Take 1 tablet (20 mg total) by mouth 2 (two) times daily. Patient not taking: Reported on 11/14/2019 10/07/19   Palumbo, April, MD  esomeprazole (NEXIUM) 40 MG capsule Take 1 capsule (40 mg total) daily by mouth. Patient not taking: Reported on 11/14/2019 07/02/17   Charlynne Pander, MD  nicotine (NICODERM CQ - DOSED IN MG/24 HOURS) 14 mg/24hr patch Place 1 patch (14 mg total) onto the skin daily. Patient not taking: Reported on 11/14/2019 05/20/16   Joseph Art, DO  oxyCODONE-acetaminophen (PERCOCET) 5-325 MG tablet Take 1-2 tablets by mouth every 4 (four) hours as needed. Patient not taking: Reported on 11/14/2019 07/09/17   Rolan Bucco, MD  oxyCODONE-acetaminophen (PERCOCET) 7.5-325 MG tablet Take 1 tablet every  6 (six) hours as needed by mouth for severe pain. Patient not taking: Reported on 11/14/2019 07/02/17   Charlynne Pander, MD  promethazine (PHENERGAN) 25 MG tablet Take 1 tablet (25 mg total) every 6 (six) hours as needed by mouth for nausea or vomiting. Patient not taking: Reported on 11/14/2019 07/02/17   Charlynne Pander, MD    Allergies    Prednisone  Review of Systems   Review of Systems  Constitutional: Negative.   HENT: Negative.   Respiratory: Positive for shortness of breath. Negative for apnea, cough, choking, chest tightness, wheezing and stridor.   Cardiovascular: Positive for chest pain. Negative for palpitations and leg swelling.  Gastrointestinal: Negative.   Genitourinary: Negative.   Musculoskeletal: Negative.   Skin: Negative.   Neurological: Negative.   All other systems reviewed and are negative.  Physical Exam Updated Vital Signs BP 132/82   Pulse 71   Temp 97.7 F (36.5 C) (Oral)   Resp 13   Ht 6\' 6"  (1.981 m)   Wt (!) 141.1 kg   SpO2 96%   BMI 35.94 kg/m   Physical Exam Vitals and nursing note reviewed.  Constitutional:      General: He is not in acute distress.    Appearance: He is not ill-appearing, toxic-appearing or diaphoretic.  HENT:     Head: Normocephalic and atraumatic.     Jaw: There is normal jaw occlusion.     Nose: Nose normal.  Eyes:     Extraocular Movements: Extraocular movements intact.  Neck:     Vascular: No carotid bruit or JVD.     Trachea: Trachea and phonation normal.     Meningeal: Brudzinski's sign and Kernig's sign absent.  Cardiovascular:     Rate and Rhythm: Normal rate.     Pulses: Normal pulses.          Radial pulses are 2+ on the right side and 2+ on the left side.       Posterior tibial pulses are 2+ on the right side and 2+ on the left side.     Heart sounds: Normal heart sounds.  Pulmonary:     Effort: Pulmonary effort is normal.     Breath sounds: Normal breath sounds and air entry.  Chest:      Chest wall: No deformity, swelling, tenderness, crepitus or edema.  Abdominal:     General: Bowel sounds are normal.     Palpations: Abdomen  is soft.  Musculoskeletal:        General: Normal range of motion.     Cervical back: Full passive range of motion without pain, normal range of motion and neck supple.     Right lower leg: No tenderness. No edema.     Left lower leg: No tenderness. No edema.     Comments: Compartments soft. Homans negative  Feet:     Right foot:     Skin integrity: Skin integrity normal.     Left foot:     Skin integrity: Skin integrity normal.  Skin:    General: Skin is warm.     Capillary Refill: Capillary refill takes less than 2 seconds.  Neurological:     General: No focal deficit present.     Mental Status: He is alert and oriented to person, place, and time.     ED Results / Procedures / Treatments   Labs (all labs ordered are listed, but only abnormal results are displayed) Labs Reviewed  BASIC METABOLIC PANEL - Abnormal; Notable for the following components:      Result Value   Glucose, Bld 205 (*)    All other components within normal limits  CBC - Abnormal; Notable for the following components:   WBC 14.3 (*)    All other components within normal limits  TROPONIN I (HIGH SENSITIVITY) - Abnormal; Notable for the following components:   Troponin I (High Sensitivity) 114 (*)    All other components within normal limits  TROPONIN I (HIGH SENSITIVITY) - Abnormal; Notable for the following components:   Troponin I (High Sensitivity) 129 (*)    All other components within normal limits  SARS CORONAVIRUS 2 (TAT 6-24 HRS)  APTT  HEPARIN LEVEL (UNFRACTIONATED)  CBC    EKG EKG Interpretation  Date/Time:  Tuesday November 14 2019 13:57:24 EDT Ventricular Rate:  80 PR Interval:  174 QRS Duration: 104 QT Interval:  404 QTC Calculation: 465 R Axis:   -110 Text Interpretation: Normal sinus rhythm Right superior axis deviation Abnormal ECG  Confirmed by Madalyn Rob [phone]) on 11/14/2019 4:08:34 PM   Radiology DG Chest 2 View  Result Date: 11/14/2019 CLINICAL DATA:  Chest pain over the last several days. Recent pancreatitis. EXAM: CHEST - 2 VIEW COMPARISON:  07/02/2017 FINDINGS: The heart size and mediastinal contours are within normal limits. Both lungs are clear. The visualized skeletal structures are unremarkable. IMPRESSION: No active cardiopulmonary disease. Electronically Signed   By: Nelson Chimes M.D.   On: 11/14/2019 14:40    Procedures .Critical Care Performed by: Nettie Elm, PA-C Authorized by: Nettie Elm, PA-C   Critical care provider statement:    Critical care time (minutes):  45   Critical care was necessary to treat or prevent imminent or life-threatening deterioration of the following conditions:  Cardiac failure   Critical care was time spent personally by me on the following activities:  Discussions with consultants, evaluation of patient's response to treatment, examination of patient, ordering and performing treatments and interventions, ordering and review of laboratory studies, ordering and review of radiographic studies, pulse oximetry, re-evaluation of patient's condition, obtaining history from patient or surrogate and review of old charts   (including critical care time)  Medications Ordered in ED Medications  nitroGLYCERIN (NITROSTAT) SL tablet 0.4 mg (0.4 mg Sublingual Given 11/14/19 1652)  sodium chloride flush (NS) 0.9 % injection 3 mL (has no administration in time range)  heparin ADULT infusion 100 units/mL (25000 units/26mL sodium chloride  0.45%) (has no administration in time range)  aspirin chewable tablet 324 mg (324 mg Oral Given 11/14/19 1651)  levalbuterol (XOPENEX) nebulizer solution 0.63 mg (0.63 mg Nebulization Given 11/14/19 1801)    ED Course  I have reviewed the triage vital signs and the nursing notes.  Pertinent labs & imaging results that were available  during my care of the patient were reviewed by me and considered in my medical decision making (see chart for details).  49 year old presents for evaluation hest pain.  Intermittent since Thursday.  Current chest pain now.  Not necessarily exertional in nature however did have 2 episodes of exertional chest pain which radiated to his left arm with some shortness of breath.  Had episode last night which woke him up from sleep.  Denna HaggardHomans' sign negative.  No evidence of DVT on exam.  He is without tachycardia, tachypnea or hypoxia.  No cough or upper respiratory symptoms. Will give nitro and ASA.  Seen by PCP yesterday and called today for elevated Trop. Heart score-5, Wells criteria low risk, elevated troponin to 114 PCP consulted with Dr. Elberta Fortisamnitz who recommended ED evaluation  Labs and imaging personally reviewed and interpreted: CBC with mild leukocytosis at 14.3 Metabolic panel with mild hyperglycemia to 205 Troponin elevated at 114 EKG without STEMI Dg chest without cardiomegaly, pulmonary edema, pneumothorax or infiltrate  1610: CONSULT with Trish with Cardiology who states will send someone to evaluate.  Cardiology PA Fransico MichaelFurth has evaluated patient.  Plan on starting on IV heparin.  Likely cath tomorrow.    Troponin trending up, NSTEMI. He is being admitted to the cardiology service.  The patient appears reasonably stabilized for admission considering the current resources, flow, and capabilities available in the ED at this time, and I doubt any other [facility] requiring further screening and/or treatment in the ED prior to admission.  The patient has been appropriately medically screened and/or stabilized in the ED. I have low suspicion for any other emergent medical condition which would require further screening, evaluation or treatment in the ED or require inpatient management.  Patient is hemodynamically stable and in no acute distress.  Patient able to ambulate in department prior to ED.   Evaluation does not show acute pathology that would require ongoing or additional emergent interventions while in the emergency department or further inpatient treatment.  I have discussed the diagnosis with the patient and answered all questions.  Pain is been managed while in the emergency department and patient has no further complaints prior to discharge.  Patient is comfortable with plan discussed in room and is stable for discharge at this time.  I have discussed strict return precautions for returning to the emergency department.  Patient was encouraged to follow-up with PCP/specialist refer to at discharge.    .MDM Rules/Calculators/A&P HEAR Score: 6   CHA2DS2-VASc Score: 1                 49 year old male comes in for chest pain radiates to left arm with shortness of breath and some nausea.  He has elevated troponins which are trending upward.  Nonspecific EKG changes.  Patient to be managed cardiology service.  Heparin has been started.  Pain controlled in ED.  Low suspicion for PE or dissection.  Final Clinical Impression(s) / ED Diagnoses Final diagnoses:  NSTEMI (non-ST elevated myocardial infarction) (HCC)    Rx / DC Orders ED Discharge Orders    None       Dorotea Hand A, PA-C  11/14/19 1832    Silas Sedam A, PA-C 11/14/19 1833    Milagros Loll, MD 11/14/19 2250

## 2019-11-14 NOTE — H&P (Addendum)
Cardiology Admission History and Physical:   Patient ID: Dean [name] MRN: [id number]; DOB: 1970-12-25   Admission date: 11/14/2019  Primary Care Provider: Mickle Plumb, NP Primary Cardiologist: Dr. Ladona Ridgel Primary Electrophysiologist:  Dr. Ladona Ridgel  Chief Complaint:  Chest pain  Patient Profile:   Dean [name] is a 49 y.o. male with pmh of aifb RVR recently started on Eliquis, tachypalpitations, WPW, anxiety, asthma/COPD, chronic pancreatitis, obesity, tobacco abuse, DM2 who is being evaluated for chest pain.   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 He has seen Dr. Ladona Ridgel in the past for WPW, tachypalpitations, and afib. The patient has undergone multiple catheter ablations but had persistent WPW and Afib RVR. Patient improved since being started on amiodarone. Echo in 2017 showed EF 50-55%, mildly dilated RV. He does not have known CAD or prior cardiac testing. He was last seen 09/2016 by Dr. Ladona Ridgel and was stable, in sinus.   The patient presented to the ED 11/14/19 for chest pain. Pain began 4 days ago. The patient was fertilizing plants when the pain began. It was in the middle of his chest and was a tightness, 8/10. Felt sob with the pain. Pain lasted an hour. No N/V, diaphoresis. He took 2 aspirin which mildly improved the pain. He felt the pain again 2 days ago while at rest. Ir woke him up from sleep and was on the left side of his chest and radiated down his arm. He had another episode last night.   In the ED BP 139/86, pulse 79, afebrile, RR 16, 98% O2. Labs showed glucose 205, WBC 14.3, creatinine 0.78, Hgb 16.5.  Hs troponin 114. CXR unremarkable. EKG with sinus and TWI III. He was given Aspirin 324mg  in the ER.Cardiology was consulted for possible admission.  The patient reported his last episode of pancreatitis was 2 weeks ago. He quit drinking 3 years ago. He still smokes 1 pack every 2 days. Denies drug  use. He works full time at his and lives with his wife. Patient says he took Eliquis this morning. He previously was just taking aspirin and was just started on Eliquis by his PCP.    Past Medical History:  Diagnosis Date  . Anxiety   . Asthma   . Atrial arrhythmia    multiples with 5-6 ablations  . Atrial fibrillation with rapid ventricular response (HCC)    With rates up to 200 as well as short PR tachycardia, cycle length of 240 ms.  . Chest tightness 05/08/14  . Diabetes mellitus, type II (HCC)   . GERD (gastroesophageal reflux disease)   . Morbid obesity (HCC)   . Palpitations 05/08/14  . Pancreatitis   . Pneumonia ~ 2010  . Snores    Wil occasional apnea. Had a remote sleep study but does not remember results.  . SOB (shortness of breath) 05/08/14  . SVT (supraventricular tachycardia) (HCC)   . WPW (Wolff-Parkinson-White syndrome)     Past Surgical History:  Procedure Laterality Date  . ATRIAL FIBRILLATION ABLATION     "& WPW"  . CARDIAC CATHETERIZATION     "6 or 7" (05/18/2016)  . WRIST FRACTURE SURGERY Right 1990s     Medications Prior to Admission: Prior to Admission medications   Medication Sig Start Date End Date Taking? Authorizing Provider  alprazolam 07/18/2016) 2 MG tablet Take 2 mg by mouth 2 (two) times daily.     [provider]  amiodarone (PACERONE) 200 MG tablet TAKE 1 TABLET ONCE DAILY. Patient taking differently: Take 200 mg by mouth daily.  12/14/16   Marinus Maw, MD  aspirin EC 81 MG tablet Take 1 tablet (81 mg total) by mouth daily. 05/19/16   Joseph Art, DO  atorvastatin (LIPITOR) 20 MG tablet Take 1 tablet (20 mg total) by mouth daily at 6 PM. 05/19/16   Joseph Art, DO  budesonide-formoterol (SYMBICORT) 160-4.5 MCG/ACT inhaler Inhale 2 puffs into the lungs daily as needed (shortness of breath).     [provider]  cetirizine (ZYRTEC) 10 MG tablet Take 10 mg by mouth daily.    [provider]    dicyclomine (BENTYL) 20 MG tablet Take 1 tablet (20 mg total) by mouth 2 (two) times daily. 10/07/19   Palumbo, April, MD  diltiazem (CARDIZEM CD) 180 MG 24 hr capsule Take 180 mg by mouth daily.     [provider]  esomeprazole (NEXIUM) 40 MG capsule Take 1 capsule (40 mg total) daily by mouth. 07/02/17   Charlynne Pander, MD  ipratropium (ATROVENT) 0.02 % nebulizer solution Take 250 mcg by nebulization every 4 (four) hours as needed. As needed for shortness of breath    [provider]  levalbuterol (XOPENEX HFA) 45 MCG/ACT inhaler Inhale 1-2 puffs into the lungs every 6 (six) hours as needed for wheezing or shortness of breath.    [provider]  metFORMIN (GLUCOPHAGE) 1000 MG tablet Take 1 tablet by mouth 2 (two) times daily. 08/17/15   [provider]  montelukast (SINGULAIR) 10 MG tablet Take 10 mg by mouth every morning.     [provider]  multivitamin-iron-minerals-folic acid (CENTRUM) chewable tablet Chew 1 tablet by mouth daily.    [provider]  nicotine (NICODERM CQ - DOSED IN MG/24 HOURS) 14 mg/24hr patch Place 1 patch (14 mg total) onto the skin daily. 05/20/16   Joseph Art, DO  oxyCODONE-acetaminophen (PERCOCET) 5-325 MG tablet Take 1-2 tablets by mouth every 4 (four) hours as needed. 07/09/17   Rolan Bucco, MD  oxyCODONE-acetaminophen (PERCOCET) 7.5-325 MG tablet Take 1 tablet every 6 (six) hours as needed by mouth for severe pain. 07/02/17   Charlynne Pander, MD  pantoprazole (PROTONIX) 40 MG tablet Take 40 mg by mouth daily. 09/26/19   [provider]  promethazine (PHENERGAN) 25 MG tablet Take 1 tablet (25 mg total) every 6 (six) hours as needed by mouth for nausea or vomiting. 07/02/17   Charlynne Pander, MD  sucralfate (CARAFATE) 1 g tablet Take 1 tablet (1 g total) by mouth 4 (four) times daily -  with meals and at bedtime. 07/09/17   Rolan Bucco, MD  traMADol (ULTRAM) 50 MG tablet Take 50 mg by  mouth as needed for moderate pain or severe pain.    [provider]  UNABLE TO FIND Outpatient PT/OT dx: TIA eval and treat 05/19/16   Joseph Art, DO     Allergies:    Allergies  Allergen Reactions  . Prednisone Other (See Comments)    "goes crazy"    Social History:   Social History   Socioeconomic History  . Marital status: Single    Spouse name: Not on file  . Number of children: Not on file  . Years of education: Not on file  . Highest education level: Not on file  Occupational History  . Not on file  Tobacco Use  . Smoking  status: Current Every Day Smoker    Packs/day: 1.00    Years: 30.00    Pack years: 30.00  . Smokeless tobacco: Never Used  Substance and Sexual Activity  . Alcohol use: No    Comment: none x 2 weeks  . Drug use: No  . Sexual activity: Yes  Other Topics Concern  . Not on file  Social History Narrative  . Not on file   Social Determinants of Health   Financial Resource Strain:   . Difficulty of Paying Living Expenses:   Food Insecurity:   . Worried About Charity fundraiser in the Last Year:   . Arboriculturist in the Last Year:   Transportation Needs:   . Film/video editor (Medical):   Marland Kitchen Lack of Transportation (Non-Medical):   Physical Activity:   . Days of Exercise per Week:   . Minutes of Exercise per Session:   Stress:   . Feeling of Stress :   Social Connections:   . Frequency of Communication with Friends and Family:   . Frequency of Social Gatherings with Friends and Family:   . Attends Religious Services:   . Active Member of Clubs or Organizations:   . Attends Archivist Meetings:   Marland Kitchen Marital Status:   Intimate Partner Violence:   . Fear of Current or Ex-Partner:   . Emotionally Abused:   Marland Kitchen Physically Abused:   . Sexually Abused:     Family History:   The patient's family history includes Clotting disorder in his father; Diabetes in his father; Heart attack (age of onset: 55) in his father;  Hypertension in his father.    ROS:  Please see the history of present illness.  All other ROS reviewed and negative.     Physical Exam/Data:   Vitals:   11/14/19 1355 11/14/19 1402  BP: 139/86   Pulse: 79   Resp: 16   Temp: 97.7 F (36.5 C)   TempSrc: Oral   SpO2: 98%   Weight:  (!) 141.1 kg  Height:  6\' 6"  (1.981 m)   No intake or output data in the 24 hours ending 11/14/19 1709 Last 3 Weights 11/14/2019 10/07/2019 07/09/2017  Weight (lbs) 311 lb 348 lb 5.2 oz 348 lb 5.2 oz  Weight (kg) 141.069 kg 158 kg 158 kg     Body mass index is 35.94 kg/m.  General:  Well nourished, well developed, in no acute distress HEENT: normal Lymph: no adenopathy Neck: no JVD Endocrine:  No thryomegaly Vascular: No carotid bruits; FA pulses 2+ bilaterally without bruits  Cardiac:  normal S1, S2; RRR; no murmur  Lungs:  + wheezing, no rhonchi or rales  Abd: soft, nontender, no hepatomegaly  Ext: no edema Musculoskeletal:  No deformities, BUE and BLE strength normal and equal Skin: warm and dry  Neuro:  CNs 2-12 intact, no focal abnormalities noted Psych:  Normal affect    EKG:  The ECG that was done 11/14/19 was personally reviewed and demonstrates EKG with sinus and TWI III, biphasic p wave, 80 bpm  Relevant CV Studies:  Cardiac Cath [facility] 2011 CONCLUSIONS:  CORONARY STATUS: Mild non-obstructive ASCAD   LV FUNCTION:  Ejection Fraction: 50%  Wall Motion: Normal  Echo 2017 Study Conclusions   - Left ventricle: The cavity size was normal. There was moderate  focal basal and mild concentric hypertrophy. Systolic function  was normal. The estimated ejection fraction was in the range of  50% to 55%. The  transmitral flow pattern was normal. Left  ventricular diastolic function parameters were normal.  - Mitral valve: Calcified annulus.  - Right ventricle: The cavity size was mildly dilated. Wall  thickness was normal.    Laboratory Data:  High Sensitivity Troponin:   Recent Labs  Lab 11/14/19 1409  TROPONINIHS 114*      Chemistry Recent Labs  Lab 11/14/19 1409  NA 137  K 4.7  CL 100  CO2 26  GLUCOSE 205*  BUN 13  CREATININE 0.78  CALCIUM 9.8  GFRNONAA >60  GFRAA >60  ANIONGAP 11    No results for input(s): PROT, ALBUMIN, AST, ALT, ALKPHOS, BILITOT in the last 168 hours. Hematology Recent Labs  Lab 11/14/19 1409  WBC 14.3*  RBC 5.46  HGB 16.5  HCT 49.3  MCV 90.3  MCH 30.2  MCHC 33.5  RDW 13.0  PLT 354   BNPNo results for input(s): BNP, PROBNP in the last 168 hours.  DDimer No results for input(s): DDIMER in the last 168 hours.   Radiology/Studies:  DG Chest 2 View  Result Date: 11/14/2019 CLINICAL DATA:  Chest pain over the last several days. Recent pancreatitis. EXAM: CHEST - 2 VIEW COMPARISON:  07/02/2017 FINDINGS: The heart size and mediastinal contours are within normal limits. Both lungs are clear. The visualized skeletal structures are unremarkable. IMPRESSION: No active cardiopulmonary disease. Electronically Signed   By: Paulina FusiMark  Shogry M.D.   On: 11/14/2019 14:40   TIMI Risk Score for Unstable Angina or Non-ST Elevation MI:   The patient's TIMI risk score is 3, which indicates a 13% risk of all cause mortality, new or recurrent myocardial infarction or need for urgent revascularization in the next 14 days.   Assessment and Plan:   NSTEMI Patient presents for progressive chest pain with associated sob. HS troponin 114. EKG with TWI III. CXR unremarkable. Creatinine wnl. - Still has some mild chest pain.  - [facility] mild nonobstructive CAD. - Would plan to admit for observation and possible cath. Hold Eliquis for cath. He just started Eliquis so has only had 1 dose. Plan to start IV heparin tonight at 6PM. - trend troponin - Aspirin already administered - continue Telemetry - Order echo  Paroxysmal Afib  - Initial EKG in sinus rhythm>>now  in Afib HR 120s - hold Eliquis for possible cath - continue amiodarone - Increase Cardizem for rate control - Plan to re-start Eliquis 5 mg BID post cath  HLD - atorvastatin 20 mg daily - LDL 98 in 2017 - re-check lipids  WPW/tachypalpitations s/p multiple ablations - continue Cardizem for rate control - Telemetry  COPD - Xopenex for wheezing  DM2 - SSI  Severity of Illness: The appropriate patient status for this patient is INPATIENT. Inpatient status is judged to be reasonable and necessary in order to provide the required intensity of service to ensure the patient's safety. The patient's presenting symptoms, physical exam findings, and initial radiographic and laboratory data in the context of their chronic comorbidities is felt to place them at high risk for further clinical deterioration. Furthermore, it is not anticipated that the patient will be medically stable for discharge from the hospital within 2 midnights of admission. The following factors support the patient status of inpatient.   " The patient's presenting symptoms include chest pain . " The worrisome physical exam findings include chest pain. " The initial radiographic and laboratory data are worrisome because of elevated troponin. " The chronic co-morbidities include  HLD + Afib+DM2.   * I certify that at the point of admission it is my clinical judgment that the patient will require inpatient hospital care spanning beyond 2 midnights from the point of admission due to high intensity of service, high risk for further deterioration and high frequency of surveillance required.*    For questions or updates, please contact [facility] Please consult [email or web address] for contact info under      Signed, Cadence David Stall, PA-C  11/14/2019 5:09 PM    Patient seen and examined. Agree with assessment and plan.  [name] is a 49 year old with a history of obesity, asthma/COPD, chronic pancreatitis, history of  WPW s/p ablation and also has a history of atrial fibrillation.  He remotely had a history of EtOH use but quit drinking 3 years ago.  He has continued to experience episodes of intermittent chronic pancreatitis.  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  Over the past 4 days he has noticed recurrent episodes of chest pressure and tightness.  Pain initiated on Saturday when he was working in his yard.  He has had recurrent episodes subsequently with an episode of waking up from sleep.  Today while at work he had recurrent episodes of some mild discomfort in his chest.  He admits that he wheezes often.  He tells me he saw his primary physician yesterday and laboratory was drawn.  He also was given a prescription for Eliquis and he apparently took his first dose of Eliquis 5 mg today.  However he was not definitive by the name of the medication.  Of recurrent symptomatology today while at work he presented to the emergency room.  On presentation his initial ECG demonstrated sinus rhythm without significant ST-T changes of the there was mild nondiagnostic T wave abnormality in lead III. While I was examining the patient, his telemetry revealed atrial fibrillation with ventricular rate ranging from 105 to 122.  Exam he is mesomorphic in appearance.  He is bearded.  He has thick neck.  He had diffuse mild inspiratory wheezing anteriorly and posteriorly.  Rhythm was irregular irregular with ventricular rate approximately 110 bpm.  1/6 systolic murmur.  He had central adiposity.  Pulses were 2+.  There was no significant edema.  Neurologically he was grossly nonfocal.  Will repeat ECG now that he is in atrial fibrillation.  Laboratories notable for potassium 4.7.  Glucose 205.  Creatinine 0.78.  Troponin 0 mildly increased at 114.  Hemoglobin  hematocrit are stable at 16.5 and 49.3.  CXR was unremarkable.  We will admit the patient cardiac telemetry.  Obtain serial ECG and troponin levels.  If the patient had only taken 1 dose of Eliquis, will hold plan to initiate heparin immediately in of recurrent atrial fibrillation and potential ischemia.  Previously had been a 2 pack/day smoker for many years and recently has reduced his to 1/2 pack/day.  I have recommended definitive cardiac catheterization to find his coronary anatomy particularly with his chest pain symptomatology.  He does have mild diffuse wheezing on exam.  I have recommended Xopenex nebulizer treatment.  We will tentatively schedule patient for cardiac catheterization with possible intervention to be done early afternoon tomorrow.  Recommend 2D echo Doppler study to reassess systolic and diastolic function.    Lennette Bihari, MD, [facility] 11/14/2019  5:27 PM

## 2019-11-14 NOTE — ED Triage Notes (Signed)
Pt reports CP since Saturday and had blood down at PCP. States his troponin was elevated. Central tightness with no radiation. Hx of Wolf-Parkinson-White

## 2019-11-14 NOTE — Progress Notes (Signed)
ANTICOAGULATION CONSULT NOTE - Initial Consult  Pharmacy Consult for heparin Indication: chest pain/ACS and atrial fibrillation  Allergies  Allergen Reactions  . Prednisone Other (See Comments)    "goes crazy"    Patient Measurements: Height: 6\' 6"  (198.1 cm) Weight: (!) 311 lb (141.1 kg) IBW/kg (Calculated) : 91.4 Heparin Dosing Weight: 122.3kg  Vital Signs: Temp: 97.7 F (36.5 C) (03/30 1355) Temp Source: Oral (03/30 1355) BP: 148/82 (03/30 1715) Pulse Rate: 79 (03/30 1355)  Labs: Recent Labs    11/14/19 1409  HGB 16.5  HCT 49.3  PLT 354  CREATININE 0.78  TROPONINIHS 114*    Estimated Creatinine Clearance: 179.7 mL/min (by C-G formula based on SCr of 0.78 mg/dL).   Medical History: Past Medical History:  Diagnosis Date  . Anxiety   . Asthma   . Atrial arrhythmia    multiples with 5-6 ablations  . Atrial fibrillation with rapid ventricular response (HCC)    With rates up to 200 as well as short PR tachycardia, cycle length of 240 ms.  . Chest tightness 05/08/14  . Diabetes mellitus, type II (HCC)   . GERD (gastroesophageal reflux disease)   . Morbid obesity (HCC)   . Palpitations 05/08/14  . Pancreatitis   . Pneumonia ~ 2010  . Snores    Wil occasional apnea. Had a remote sleep study but does not remember results.  . SOB (shortness of breath) 05/08/14  . SVT (supraventricular tachycardia) (HCC)   . WPW (Wolff-Parkinson-White syndrome)    Assessment: 60 YOM presenting with CP x 4d, on Eliquis PTA for Afib with last dose 3/30 @0800 .  CBC wnl.    Goal of Therapy:  Heparin level 0.3-0.7 units/ml aPTT 66-102 seconds Monitor platelets by anticoagulation protocol: Yes   Plan:  Heparin gtt at 1450 units/hr 12h post Eliquis dose, no bolus F/u 6 hour aPTT F/u post-cath, transition back to Eliquis  4/30, PharmD Clinical Pharmacist ED Pharmacist Phone # [phone] 11/14/2019 5:48 PM

## 2019-11-15 ENCOUNTER — Inpatient Hospital Stay (HOSPITAL_COMMUNITY): Payer: BC Managed Care – PPO

## 2019-11-15 ENCOUNTER — Encounter (HOSPITAL_COMMUNITY)
Admission: EM | Disposition: A | Discharge status: Home / Self Care | Payer: Self-pay | Source: Home / Self Care | Attending: Cardiovascular Disease

## 2019-11-15 DIAGNOSIS — E785 Hyperlipidemia, unspecified: Secondary | ICD-10-CM

## 2019-11-15 DIAGNOSIS — I2510 Atherosclerotic heart disease of native coronary artery without angina pectoris: Secondary | ICD-10-CM

## 2019-11-15 DIAGNOSIS — R079 Chest pain, unspecified: Secondary | ICD-10-CM

## 2019-11-15 DIAGNOSIS — R062 Wheezing: Secondary | ICD-10-CM

## 2019-11-15 DIAGNOSIS — I214 Non-ST elevation (NSTEMI) myocardial infarction: Principal | ICD-10-CM

## 2019-11-15 HISTORY — PX: LEFT HEART CATH AND CORONARY ANGIOGRAPHY: CATH118249

## 2019-11-15 HISTORY — PX: CORONARY STENT INTERVENTION: CATH118234

## 2019-11-15 LAB — BASIC METABOLIC PANEL
Anion gap: 11 (ref 5–15)
BUN: 11 mg/dL (ref 6–20)
CO2: 28 mmol/L (ref 22–32)
Calcium: 9.3 mg/dL (ref 8.9–10.3)
Chloride: 98 mmol/L (ref 98–111)
Creatinine, Ser: 0.79 mg/dL (ref 0.61–1.24)
GFR calc Af Amer: >60 mL/min (ref >60)
GFR calc non Af Amer: >60 mL/min (ref >60)
Glucose, Bld: 257 mg/dL — ABNORMAL HIGH (ref 70–99)
Potassium: 3.8 mmol/L (ref 3.5–5.1)
Sodium: 137 mmol/L (ref 135–145)

## 2019-11-15 LAB — GLUCOSE, CAPILLARY
Glucose-Capillary: 202 mg/dL — ABNORMAL HIGH (ref 70–99)
Glucose-Capillary: 165 mg/dL — ABNORMAL HIGH (ref 70–99)
Glucose-Capillary: 254 mg/dL — ABNORMAL HIGH (ref 70–99)
Glucose-Capillary: 197 mg/dL — ABNORMAL HIGH (ref 70–99)

## 2019-11-15 LAB — CBC
HCT: 47.5 % (ref 39.0–52.0)
Hemoglobin: 16 g/dL (ref 13.0–17.0)
MCH: 30.4 pg (ref 26.0–34.0)
MCHC: 33.7 g/dL (ref 30.0–36.0)
MCV: 90.1 fL (ref 80.0–100.0)
Platelets: 310 10*3/uL (ref 150–400)
RBC: 5.27 MIL/uL (ref 4.22–5.81)
RDW: 13.2 % (ref 11.5–15.5)
WBC: 11.9 10*3/uL — ABNORMAL HIGH (ref 4.0–10.5)
nRBC: 0 % (ref 0.0–0.2)

## 2019-11-15 LAB — LIPID PANEL
Cholesterol: 124 mg/dL (ref 0–200)
HDL: 33 mg/dL — ABNORMAL LOW (ref >40)
LDL Cholesterol: 65 mg/dL (ref 0–99)
Total CHOL/HDL Ratio: 3.8 RATIO
Triglycerides: 128 mg/dL (ref <150)
VLDL: 26 mg/dL (ref 0–40)

## 2019-11-15 LAB — HIV ANTIBODY (ROUTINE TESTING W REFLEX): HIV Screen 4th Generation wRfx: NONREACTIVE

## 2019-11-15 LAB — TROPONIN I (HIGH SENSITIVITY)
Troponin I (High Sensitivity): 100 ng/L (ref <18)
Troponin I (High Sensitivity): 102 ng/L (ref <18)
Troponin I (High Sensitivity): 216 ng/L (ref <18)
Troponin I (High Sensitivity): 775 ng/L (ref <18)

## 2019-11-15 LAB — APTT: aPTT: 38 seconds — ABNORMAL HIGH (ref 24–36)

## 2019-11-15 LAB — ECHOCARDIOGRAM COMPLETE
Height: 79 in
Weight: 4998.4 oz

## 2019-11-15 LAB — TSH: TSH: 1.907 u[IU]/mL (ref 0.350–4.500)

## 2019-11-15 LAB — HEPARIN LEVEL (UNFRACTIONATED): Heparin Unfractionated: 0.19 IU/mL — ABNORMAL LOW (ref 0.30–0.70)

## 2019-11-15 LAB — HEMOGLOBIN A1C
Hgb A1c MFr Bld: 11.1 % — ABNORMAL HIGH (ref 4.8–5.6)
Mean Plasma Glucose: 271.87 mg/dL

## 2019-11-15 LAB — CBG MONITORING, ED: Glucose-Capillary: 302 mg/dL — ABNORMAL HIGH (ref 70–99)

## 2019-11-15 LAB — MAGNESIUM: Magnesium: 1.5 mg/dL — ABNORMAL LOW (ref 1.7–2.4)

## 2019-11-15 LAB — POCT ACTIVATED CLOTTING TIME
Activated Clotting Time: 268 seconds
Activated Clotting Time: 290 seconds

## 2019-11-15 SURGERY — LEFT HEART CATH AND CORONARY ANGIOGRAPHY
Anesthesia: LOCAL

## 2019-11-15 MED ORDER — SODIUM CHLORIDE 0.9 % WEIGHT BASED INFUSION
3.0000 mL/kg/h | INTRAVENOUS | Status: DC
  Administered 2019-11-15: 3 mL/kg/h via INTRAVENOUS

## 2019-11-15 MED ORDER — LIDOCAINE HCL (PF) 1 % IJ SOLN
INTRAMUSCULAR | Status: AC
  Filled 2019-11-15: qty 30

## 2019-11-15 MED ORDER — FENTANYL CITRATE (PF) 100 MCG/2ML IJ SOLN
INTRAMUSCULAR | Status: AC
  Filled 2019-11-15: qty 2

## 2019-11-15 MED ORDER — NITROGLYCERIN 0.4 MG SL SUBL
0.4000 mg | SUBLINGUAL_TABLET | SUBLINGUAL | Status: DC | PRN
  Administered 2019-11-15: 0.4 mg via SUBLINGUAL
  Filled 2019-11-15: qty 1

## 2019-11-15 MED ORDER — ASPIRIN 300 MG RE SUPP: 300.0000 mg | RECTAL | Status: AC

## 2019-11-15 MED ORDER — ASPIRIN EC 81 MG PO TBEC: 81.0000 mg | DELAYED_RELEASE_TABLET | Freq: Every day | ORAL | Status: DC

## 2019-11-15 MED ORDER — SODIUM CHLORIDE 0.9 % WEIGHT BASED INFUSION: 1.0000 mL/kg/h | INTRAVENOUS | Status: DC

## 2019-11-15 MED ORDER — MORPHINE SULFATE (PF) 2 MG/ML IV SOLN
INTRAVENOUS | Status: AC
  Filled 2019-11-15: qty 1

## 2019-11-15 MED ORDER — ACETAMINOPHEN 325 MG PO TABS
650.0000 mg | ORAL_TABLET | ORAL | Status: DC | PRN
  Administered 2019-11-15 – 2019-11-16 (×5): 650 mg via ORAL
  Filled 2019-11-15 (×5): qty 2

## 2019-11-15 MED ORDER — NICOTINE 14 MG/24HR TD PT24
14.0000 mg | MEDICATED_PATCH | Freq: Every day | TRANSDERMAL | Status: DC
  Administered 2019-11-16: 14 mg via TRANSDERMAL
  Filled 2019-11-15: qty 1

## 2019-11-15 MED ORDER — ZOLPIDEM TARTRATE 5 MG PO TABS: 5.0000 mg | ORAL_TABLET | Freq: Every evening | ORAL | Status: DC | PRN

## 2019-11-15 MED ORDER — PERFLUTREN LIPID MICROSPHERE
1.0000 mL | INTRAVENOUS | Status: AC | PRN
  Administered 2019-11-15: 2 mL via INTRAVENOUS
  Filled 2019-11-15: qty 10

## 2019-11-15 MED ORDER — FLUTICASONE FUROATE-VILANTEROL 200-25 MCG/INH IN AEPB
1.0000 | INHALATION_SPRAY | Freq: Every day | RESPIRATORY_TRACT | Status: DC
  Administered 2019-11-17: 1 via RESPIRATORY_TRACT
  Filled 2019-11-15 (×2): qty 28

## 2019-11-15 MED ORDER — NITROGLYCERIN 1 MG/10 ML FOR IR/CATH LAB
INTRA_ARTERIAL | Status: DC | PRN
  Administered 2019-11-15: 200 ug via INTRACORONARY

## 2019-11-15 MED ORDER — INSULIN ASPART 100 UNIT/ML ~~LOC~~ SOLN
0.0000 [IU] | Freq: Three times a day (TID) | SUBCUTANEOUS | Status: DC
  Administered 2019-11-15: 7 [IU] via SUBCUTANEOUS
  Administered 2019-11-15: 11 [IU] via SUBCUTANEOUS
  Administered 2019-11-16: 7 [IU] via SUBCUTANEOUS
  Administered 2019-11-16: 11 [IU] via SUBCUTANEOUS
  Administered 2019-11-16: 4 [IU] via SUBCUTANEOUS
  Administered 2019-11-17: 7 [IU] via SUBCUTANEOUS

## 2019-11-15 MED ORDER — HEPARIN (PORCINE) IN NACL 1000-0.9 UT/500ML-% IV SOLN
INTRAVENOUS | Status: DC | PRN
  Administered 2019-11-15 (×2): 500 mL

## 2019-11-15 MED ORDER — ONDANSETRON HCL 4 MG/2ML IJ SOLN: 4.0000 mg | Freq: Four times a day (QID) | INTRAMUSCULAR | Status: DC | PRN

## 2019-11-15 MED ORDER — INSULIN ASPART 100 UNIT/ML ~~LOC~~ SOLN
0.0000 [IU] | Freq: Every day | SUBCUTANEOUS | Status: DC
  Administered 2019-11-15: 4 [IU] via SUBCUTANEOUS
  Administered 2019-11-16: 2 [IU] via SUBCUTANEOUS

## 2019-11-15 MED ORDER — MORPHINE SULFATE (PF) 2 MG/ML IV SOLN
2.0000 mg | INTRAVENOUS | Status: DC | PRN
  Administered 2019-11-15 – 2019-11-16 (×4): 2 mg via INTRAVENOUS
  Filled 2019-11-15 (×4): qty 1

## 2019-11-15 MED ORDER — NITROGLYCERIN IN D5W 200-5 MCG/ML-% IV SOLN
0.0000 ug/min | INTRAVENOUS | Status: DC
  Administered 2019-11-16: 50 ug/min via INTRAVENOUS
  Filled 2019-11-15: qty 250

## 2019-11-15 MED ORDER — LIDOCAINE HCL (PF) 1 % IJ SOLN
INTRAMUSCULAR | Status: DC | PRN
  Administered 2019-11-15: 2 mL

## 2019-11-15 MED ORDER — CLOPIDOGREL BISULFATE 300 MG PO TABS
ORAL_TABLET | ORAL | Status: DC | PRN
  Administered 2019-11-15: 600 mg via ORAL

## 2019-11-15 MED ORDER — HEPARIN (PORCINE) 25000 UT/250ML-% IV SOLN
1650.0000 [IU]/h | INTRAVENOUS | Status: DC
  Administered 2019-11-15: 1650 [IU]/h via INTRAVENOUS
  Filled 2019-11-15 (×2): qty 250

## 2019-11-15 MED ORDER — VERAPAMIL HCL 2.5 MG/ML IV SOLN
INTRAVENOUS | Status: AC
  Filled 2019-11-15: qty 2

## 2019-11-15 MED ORDER — PANTOPRAZOLE SODIUM 40 MG PO TBEC
40.0000 mg | DELAYED_RELEASE_TABLET | Freq: Every day | ORAL | Status: DC
  Administered 2019-11-16: 40 mg via ORAL
  Filled 2019-11-15: qty 1

## 2019-11-15 MED ORDER — SODIUM CHLORIDE 0.9% FLUSH: 3.0000 mL | INTRAVENOUS | Status: DC | PRN

## 2019-11-15 MED ORDER — FAMOTIDINE IN NACL 20-0.9 MG/50ML-% IV SOLN
INTRAVENOUS | Status: AC
  Filled 2019-11-15: qty 50

## 2019-11-15 MED ORDER — MIDAZOLAM HCL 2 MG/2ML IJ SOLN
INTRAMUSCULAR | Status: DC | PRN
  Administered 2019-11-15: 2 mg via INTRAVENOUS
  Administered 2019-11-15: 0.5 mg via INTRAVENOUS
  Administered 2019-11-15: 1 mg via INTRAVENOUS
  Administered 2019-11-15: 0.5 mg via INTRAVENOUS

## 2019-11-15 MED ORDER — LEVALBUTEROL TARTRATE 45 MCG/ACT IN AERO: 1.0000 | INHALATION_SPRAY | Freq: Four times a day (QID) | RESPIRATORY_TRACT | Status: DC | PRN

## 2019-11-15 MED ORDER — HEPARIN SODIUM (PORCINE) 1000 UNIT/ML IJ SOLN
INTRAMUSCULAR | Status: AC
  Filled 2019-11-15: qty 1

## 2019-11-15 MED ORDER — LEVALBUTEROL HCL 0.63 MG/3ML IN NEBU: 0.6300 mg | INHALATION_SOLUTION | Freq: Four times a day (QID) | RESPIRATORY_TRACT | Status: DC | PRN

## 2019-11-15 MED ORDER — VERAPAMIL HCL 2.5 MG/ML IV SOLN
INTRAVENOUS | Status: DC | PRN
  Administered 2019-11-15: 10:00:00 10 mL via INTRA_ARTERIAL

## 2019-11-15 MED ORDER — FAMOTIDINE IN NACL 20-0.9 MG/50ML-% IV SOLN
INTRAVENOUS | Status: AC | PRN
  Administered 2019-11-15: 20 mg via INTRAVENOUS

## 2019-11-15 MED ORDER — SUCRALFATE 1 G PO TABS
1.0000 g | ORAL_TABLET | Freq: Three times a day (TID) | ORAL | Status: DC
  Administered 2019-11-15 – 2019-11-16 (×6): 1 g via ORAL
  Filled 2019-11-15 (×5): qty 1

## 2019-11-15 MED ORDER — CLOPIDOGREL BISULFATE 75 MG PO TABS
75.0000 mg | ORAL_TABLET | Freq: Every day | ORAL | Status: DC
  Administered 2019-11-16 – 2019-11-17 (×2): 75 mg via ORAL
  Filled 2019-11-15 (×2): qty 1

## 2019-11-15 MED ORDER — SODIUM CHLORIDE 0.9 % IV SOLN: INTRAVENOUS | Status: AC

## 2019-11-15 MED ORDER — DICYCLOMINE HCL 20 MG PO TABS
20.0000 mg | ORAL_TABLET | Freq: Two times a day (BID) | ORAL | Status: DC
  Administered 2019-11-15 – 2019-11-17 (×5): 20 mg via ORAL
  Filled 2019-11-15 (×6): qty 1

## 2019-11-15 MED ORDER — MIDAZOLAM HCL 2 MG/2ML IJ SOLN
INTRAMUSCULAR | Status: AC
  Filled 2019-11-15: qty 2

## 2019-11-15 MED ORDER — ALPRAZOLAM 0.25 MG PO TABS
0.2500 mg | ORAL_TABLET | Freq: Two times a day (BID) | ORAL | Status: DC | PRN
  Administered 2019-11-16: 0.25 mg via ORAL
  Filled 2019-11-15: qty 1

## 2019-11-15 MED ORDER — SODIUM CHLORIDE 0.9% FLUSH
3.0000 mL | Freq: Two times a day (BID) | INTRAVENOUS | Status: DC
  Administered 2019-11-16 (×3): 3 mL via INTRAVENOUS

## 2019-11-15 MED ORDER — NITROGLYCERIN IN D5W 200-5 MCG/ML-% IV SOLN
INTRAVENOUS | Status: AC | PRN
  Administered 2019-11-15: 20 ug/min via INTRAVENOUS

## 2019-11-15 MED ORDER — MONTELUKAST SODIUM 10 MG PO TABS
10.0000 mg | ORAL_TABLET | ORAL | Status: DC
  Administered 2019-11-15 – 2019-11-16 (×2): 10 mg via ORAL
  Filled 2019-11-15 (×3): qty 1

## 2019-11-15 MED ORDER — NITROGLYCERIN 1 MG/10 ML FOR IR/CATH LAB
INTRA_ARTERIAL | Status: AC
  Filled 2019-11-15: qty 10

## 2019-11-15 MED ORDER — FENTANYL CITRATE (PF) 100 MCG/2ML IJ SOLN
INTRAMUSCULAR | Status: DC | PRN
  Administered 2019-11-15 (×4): 25 ug via INTRAVENOUS
  Administered 2019-11-15: 50 ug via INTRAVENOUS

## 2019-11-15 MED ORDER — ATORVASTATIN CALCIUM 10 MG PO TABS
20.0000 mg | ORAL_TABLET | Freq: Every day | ORAL | Status: DC
  Administered 2019-11-15: 20 mg via ORAL
  Filled 2019-11-15: qty 2

## 2019-11-15 MED ORDER — HEPARIN SODIUM (PORCINE) 1000 UNIT/ML IJ SOLN
INTRAMUSCULAR | Status: DC | PRN
  Administered 2019-11-15 (×2): 7000 [IU] via INTRAVENOUS
  Administered 2019-11-15: 4000 [IU] via INTRAVENOUS

## 2019-11-15 MED ORDER — AMIODARONE HCL 200 MG PO TABS: 200.0000 mg | ORAL_TABLET | Freq: Every day | ORAL | Status: DC

## 2019-11-15 MED ORDER — CLOPIDOGREL BISULFATE 300 MG PO TABS
ORAL_TABLET | ORAL | Status: AC
  Filled 2019-11-15: qty 2

## 2019-11-15 MED ORDER — SODIUM CHLORIDE 0.9 % IV SOLN
INTRAVENOUS | Status: AC | PRN
  Administered 2019-11-15: 141 mL/h via INTRAVENOUS

## 2019-11-15 MED ORDER — IOHEXOL 350 MG/ML SOLN
INTRAVENOUS | Status: DC | PRN
  Administered 2019-11-15: 235 mL

## 2019-11-15 MED ORDER — SODIUM CHLORIDE 0.9% FLUSH
3.0000 mL | Freq: Two times a day (BID) | INTRAVENOUS | Status: DC
  Administered 2019-11-15: 3 mL via INTRAVENOUS

## 2019-11-15 MED ORDER — HYDRALAZINE HCL 20 MG/ML IJ SOLN: 10.0000 mg | INTRAMUSCULAR | Status: AC | PRN

## 2019-11-15 MED ORDER — HEPARIN (PORCINE) IN NACL 1000-0.9 UT/500ML-% IV SOLN
INTRAVENOUS | Status: AC
  Filled 2019-11-15: qty 1000

## 2019-11-15 MED ORDER — SODIUM CHLORIDE 0.9 % IV SOLN: 250.0000 mL | INTRAVENOUS | Status: DC | PRN

## 2019-11-15 MED ORDER — DILTIAZEM HCL ER COATED BEADS 240 MG PO CP24
240.0000 mg | ORAL_CAPSULE | Freq: Every day | ORAL | Status: DC
  Administered 2019-11-16 – 2019-11-17 (×2): 240 mg via ORAL
  Filled 2019-11-15 (×2): qty 1

## 2019-11-15 MED ORDER — NITROGLYCERIN IN D5W 200-5 MCG/ML-% IV SOLN
INTRAVENOUS | Status: AC
  Filled 2019-11-15: qty 250

## 2019-11-15 MED ORDER — LABETALOL HCL 5 MG/ML IV SOLN: 10.0000 mg | INTRAVENOUS | Status: AC | PRN

## 2019-11-15 MED ORDER — ASPIRIN EC 81 MG PO TBEC
81.0000 mg | DELAYED_RELEASE_TABLET | Freq: Every day | ORAL | Status: DC
  Administered 2019-11-16 – 2019-11-17 (×2): 81 mg via ORAL
  Filled 2019-11-15 (×2): qty 1

## 2019-11-15 MED ORDER — ASPIRIN 81 MG PO CHEW
81.0000 mg | CHEWABLE_TABLET | ORAL | Status: AC
  Administered 2019-11-15: 81 mg via ORAL
  Filled 2019-11-15: qty 1

## 2019-11-15 MED ORDER — MORPHINE SULFATE (PF) 2 MG/ML IV SOLN
2.0000 mg | Freq: Once | INTRAVENOUS | Status: AC
  Administered 2019-11-15: 2 mg via INTRAVENOUS

## 2019-11-15 MED ORDER — ASPIRIN 81 MG PO CHEW: 324.0000 mg | CHEWABLE_TABLET | ORAL | Status: AC

## 2019-11-15 MED ORDER — LORATADINE 10 MG PO TABS
10.0000 mg | ORAL_TABLET | Freq: Every day | ORAL | Status: DC
  Administered 2019-11-16 – 2019-11-17 (×2): 10 mg via ORAL
  Filled 2019-11-15 (×2): qty 1

## 2019-11-15 SURGICAL SUPPLY — 23 items
BALLN SAPPHIRE 2.5X12 (BALLOONS) ×2
BALLN SAPPHIRE ~~LOC~~ 4.0X12 (BALLOONS) ×2 IMPLANT
BALLOON SAPPHIRE 2.5X12 (BALLOONS) ×1 IMPLANT
CATH INFINITI 5F JL4 125CM (CATHETERS) ×2 IMPLANT
CATH INFINITI 5F PIG 125CM (CATHETERS) ×2 IMPLANT
CATH INFINITI 5FR JR4 125CM (CATHETERS) ×2 IMPLANT
CATH VISTA GUIDE 6FR JR4 (CATHETERS) ×2 IMPLANT
CATH VISTA GUIDE 6FR XBLAD3.5 (CATHETERS) ×2 IMPLANT
DEVICE RAD COMP TR BAND LRG (VASCULAR PRODUCTS) ×2 IMPLANT
GLIDESHEATH SLEND SS 6F .021 (SHEATH) ×2 IMPLANT
GUIDEWIRE INQWIRE 1.5J.035X260 (WIRE) ×1 IMPLANT
INQWIRE 1.5J .035X260CM (WIRE) ×2
KIT ENCORE 26 ADVANTAGE (KITS) ×2 IMPLANT
KIT HEART LEFT (KITS) ×2 IMPLANT
PACK CARDIAC CATHETERIZATION (CUSTOM PROCEDURE TRAY) ×2 IMPLANT
STENT SYNERGY XD 3.50X16 (Permanent Stent) ×1 IMPLANT
STENT SYNERGY XD 4.0X20 (Permanent Stent) ×1 IMPLANT
SYNERGY XD 3.50X16 (Permanent Stent) ×2 IMPLANT
SYNERGY XD 4.0X20 (Permanent Stent) ×2 IMPLANT
SYR MEDRAD MARK 7 150ML (SYRINGE) ×2 IMPLANT
TRANSDUCER W/STOPCOCK (MISCELLANEOUS) ×2 IMPLANT
TUBING CIL FLEX 10 FLL-RA (TUBING) ×2 IMPLANT
WIRE COUGAR XT STRL 190CM (WIRE) ×2 IMPLANT

## 2019-11-15 NOTE — Progress Notes (Addendum)
Pt complaining of "chest tightness" this morning. Pt not in distress. EKG done, 1 SL nitro given. Provider notified. Pt stated "feels relief". Will monitor.

## 2019-11-15 NOTE — Progress Notes (Signed)
Pt leaves cath lab holding area in stable condition. Rt radial site is unremarkable. TR band is CDI. Pt is still having CP 6/10. Dr Clifton James is aware.

## 2019-11-15 NOTE — Progress Notes (Signed)
Echocardiogram 2D Echocardiogram has been performed.  Warren Lacy Daphane Odekirk 11/15/2019, 2:39 PM

## 2019-11-15 NOTE — Progress Notes (Signed)
ANTICOAGULATION CONSULT NOTE   Pharmacy Consult for Heparin (Apixaban on hold) Indication: chest pain/ACS and atrial fibrillation  Allergies  Allergen Reactions  . Prednisone Other (See Comments)    "goes crazy"    Patient Measurements: Height: 6\' 7"  (200.7 cm) Weight: (!) 312 lb 6.4 oz (141.7 kg) IBW/kg (Calculated) : 93.7 Heparin Dosing Weight: 122.3kg  Vital Signs: Temp: 98.1 F (36.7 C) (03/31 0226) Temp Source: Oral (03/31 0226) BP: 134/90 (03/31 0226) Pulse Rate: 66 (03/31 0226)  Labs: Recent Labs    11/14/19 1409 11/14/19 1705 11/15/19 0142 11/15/19 0354  HGB 16.5  --   --  16.0  HCT 49.3  --   --  47.5  PLT 354  --   --  310  APTT  --   --   --  38*  HEPARINUNFRC  --   --   --  0.19*  CREATININE 0.78  --   --   --   TROPONINIHS 114* 129* 100*  --     Estimated Creatinine Clearance: 182.3 mL/min (by C-G formula based on SCr of 0.78 mg/dL).   Medical History: Past Medical History:  Diagnosis Date  . Anxiety   . Asthma   . Atrial arrhythmia    multiples with 5-6 ablations  . Atrial fibrillation with rapid ventricular response (HCC)    With rates up to 200 as well as short PR tachycardia, cycle length of 240 ms.  . Chest tightness 05/08/14  . Diabetes mellitus, type II (HCC)   . GERD (gastroesophageal reflux disease)   . Morbid obesity (HCC)   . Palpitations 05/08/14  . Pancreatitis   . Pneumonia ~ 2010  . Snores    Wil occasional apnea. Had a remote sleep study but does not remember results.  . SOB (shortness of breath) 05/08/14  . SVT (supraventricular tachycardia) (HCC)   . WPW (Wolff-Parkinson-White syndrome)    Assessment: 67 YOM presenting with CP x 4d, on Eliquis PTA for Afib with last dose 3/30 @0800 .  CBC wnl.    3/31 AM update: aPTT low  Goal of Therapy:  Heparin level 0.3-0.7 units/ml aPTT 66-102 seconds Monitor platelets by anticoagulation protocol: Yes   Plan:  Inc heparin to 1650 units/hr Re-check heparin level/aPTT at  1300  , PharmD, BCPS Clinical Pharmacist Phone: [phone]

## 2019-11-15 NOTE — Interval H&P Note (Signed)
History and Physical Interval Note:  11/15/2019 9:53 AM  Dean [name]  has presented today for surgery, with the diagnosis of chest pain.  The various methods of treatment have been discussed with the patient and family. After consideration of risks, benefits and other options for treatment, the patient has consented to  Procedure(s): LEFT HEART CATH AND CORONARY ANGIOGRAPHY (N/A) as a surgical intervention.  The patient's history has been reviewed, patient examined, no change in status, stable for surgery.  I have reviewed the patient's chart and labs.  Questions were answered to the patient's satisfaction.    Cath Lab Visit (complete for each Cath Lab visit)  Clinical Evaluation Leading to the Procedure:   ACS: Yes.    Non-ACS:    Anginal Classification: CCS III  Anti-ischemic medical therapy: No Therapy  Non-Invasive Test Results: No non-invasive testing performed  Prior CABG: No previous CABG        Verne Carrow

## 2019-11-15 NOTE — Progress Notes (Signed)
ANTICOAGULATION CONSULT NOTE   Pharmacy Consult for Heparin (Apixaban on hold) Indication: chest pain/ACS and atrial fibrillation  Allergies  Allergen Reactions  . Prednisone Other (See Comments)    "goes crazy"    Patient Measurements: Height: 6\' 7"  (200.7 cm) Weight: (!) 312 lb 6.4 oz (141.7 kg) IBW/kg (Calculated) : 93.7 Heparin Dosing Weight: 122.3kg  Vital Signs: Temp: 97.8 F (36.6 C) (03/31 1342) Temp Source: Oral (03/31 1342) BP: 128/86 (03/31 1342) Pulse Rate: 67 (03/31 1342)  Labs: Recent Labs    11/14/19 1409 11/14/19 1409 11/14/19 1705 11/15/19 0142 11/15/19 0354  HGB 16.5  --   --   --  16.0  HCT 49.3  --   --   --  47.5  PLT 354  --   --   --  310  APTT  --   --   --   --  38*  HEPARINUNFRC  --   --   --   --  0.19*  CREATININE 0.78  --   --   --  0.79  TROPONINIHS 114*   < > 129* 100* 102*   < > = values in this interval not displayed.    Estimated Creatinine Clearance: 182.3 mL/min (by C-G formula based on SCr of 0.79 mg/dL).   Medical History: Past Medical History:  Diagnosis Date  . Anxiety   . Asthma   . Atrial arrhythmia    multiples with 5-6 ablations  . Atrial fibrillation with rapid ventricular response (HCC)    With rates up to 200 as well as short PR tachycardia, cycle length of 240 ms.  . Chest tightness 05/08/14  . Diabetes mellitus, type II (HCC)   . GERD (gastroesophageal reflux disease)   . Morbid obesity (HCC)   . Palpitations 05/08/14  . Pancreatitis   . Pneumonia ~ 2010  . Snores    Wil occasional apnea. Had a remote sleep study but does not remember results.  . SOB (shortness of breath) 05/08/14  . SVT (supraventricular tachycardia) (HCC)   . WPW (Wolff-Parkinson-White syndrome)    Assessment: 23 yoM on apixaban PTA for PAF admitted with CP. Pt now s/p DES with ongoing CP after procedure, pharmacy to restart IV heparin in 8hr. TR band still in place, RN to begin deflating air.  Goal of Therapy:  Heparin level  0.3-0.7 units/ml aPTT 66-102 seconds Monitor platelets by anticoagulation protocol: Yes   Plan:  Resume heparin 1650 units/h no bolus at 2030 Check 6hr aPTT   57, PharmD, BCPS Clinical Pharmacist [phone] Please check AMION for all [facility] Pharmacy numbers 11/15/2019

## 2019-11-15 NOTE — Progress Notes (Addendum)
Inpatient Diabetes Program Recommendations  AACE/ADA: New Consensus Statement on Inpatient Glycemic Control (2015)  Target Ranges:  Prepandial:   less than 140 mg/dL      Peak postprandial:   less than 180 mg/dL (1-2 hours)      Critically ill patients:  140 - 180 mg/dL   Lab Results  Component Value Date   GLUCAP 202 (H) 11/15/2019   HGBA1C 11.1 (H) 11/15/2019    Review of Glycemic Control Results for KAYLON, HITZ (MRN [id number]) as of 11/15/2019 09:13  Ref. Range 11/15/2019 01:31 11/15/2019 06:29  Glucose-Capillary Latest Ref Range: 70 - 99 mg/dL 475 (H) 339 (H)   Diabetes history: Type 2 DM Outpatient Diabetes medications: Humalog 8 units TID, Metformin 1000 mg BID Current orders for Inpatient glycemic control: Novolog 0-20 units TID, Novolog 0-5 units QHS  Inpatient Diabetes Program Recommendations:    Consider adding Levemir 18 units QD.   Addendum: Attempted to see patient, however, in cath lab. Will re attempt 4/1.  Thanks, Lujean Rave, MSN, RNC-OB Diabetes Coordinator [phone] (8a-5p)

## 2019-11-15 NOTE — Progress Notes (Addendum)
Patient left unit for cath lab at [phone]

## 2019-11-15 NOTE — Progress Notes (Addendum)
Progress Note  Patient Name: Dean [name] Date of Encounter: 11/15/2019  Primary Cardiologist: No primary care provider on file.   Subjective   Just returned from cath lab.   Inpatient Medications    Scheduled Meds: . amiodarone  200 mg Oral Daily  . aspirin  324 mg Oral NOW   Or  . aspirin  300 mg Rectal NOW  . aspirin EC  81 mg Oral Daily  . atorvastatin  20 mg Oral q1800  . [START ON 11/16/2019] clopidogrel  75 mg Oral Q breakfast  . dicyclomine  20 mg Oral BID  . diltiazem  240 mg Oral Daily  . fluticasone furoate-vilanterol  1 puff Inhalation Daily  . insulin aspart  0-20 Units Subcutaneous TID WC  . insulin aspart  0-5 Units Subcutaneous QHS  . loratadine  10 mg Oral Daily  . montelukast  10 mg Oral BH-q7a  . nicotine  14 mg Transdermal Daily  . pantoprazole  40 mg Oral Daily  . sodium chloride flush  3 mL Intravenous Q12H  . sodium chloride flush  3 mL Intravenous Q12H  . sodium chloride flush  3 mL Intravenous Q12H  . sucralfate  1 g Oral TID WC & HS   Continuous Infusions: . sodium chloride    . sodium chloride 75 mL/hr (11/15/19 1142)  . sodium chloride    . nitroGLYCERIN     PRN Meds: sodium chloride, sodium chloride, acetaminophen, ALPRAZolam, hydrALAZINE, labetalol, levalbuterol, nitroGLYCERIN, ondansetron (ZOFRAN) IV, sodium chloride flush, sodium chloride flush, zolpidem   Vital Signs    Vitals:   11/15/19 1205 11/15/19 1220 11/15/19 1300 11/15/19 1342  BP: (!) 152/77 (!) 138/98 (!) 142/73 128/86  Pulse: 64 60 (!) 55 67  Resp: 11 12 12    Temp:    97.8 F (36.6 C)  TempSrc:    Oral  SpO2: 94% 99% 100% 100%  Weight:      Height:        Intake/Output Summary (Last 24 hours) at 11/15/2019 1400 Last data filed at 11/15/2019 0300 Gross per 24 hour  Intake 34.83 ml  Output --  Net 34.83 ml   Last 3 Weights 11/15/2019 11/14/2019 10/07/2019  Weight (lbs) 312 lb 6.4 oz 311 lb 348 lb 5.2 oz  Weight (kg) 141.704 kg 141.069 kg 158 kg      Telemetry     SR, short run of Afib - Personally Reviewed  ECG    SR with slight ST elevation in v1-v2 - Personally Reviewed  Physical Exam  Younger WM GEN: No acute distress.   Neck: No JVD Cardiac: RRR, no murmurs, rubs, or gallops.  Respiratory: Clear to auscultation bilaterally. GI: Soft, nontender, non-distended  MS: No edema; No deformity. TR band in place.  Neuro:  Nonfocal  Psych: Normal affect   Labs    High Sensitivity Troponin:   Recent Labs  Lab 11/14/19 1409 11/14/19 1705 11/15/19 0142 11/15/19 0354  TROPONINIHS 114* 129* 100* 102*      Chemistry Recent Labs  Lab 11/14/19 1409 11/15/19 0354  NA 137 137  K 4.7 3.8  CL 100 98  CO2 26 28  GLUCOSE 205* 257*  BUN 13 11  CREATININE 0.78 0.79  CALCIUM 9.8 9.3  GFRNONAA >60 >60  GFRAA >60 >60  ANIONGAP 11 11     Hematology Recent Labs  Lab 11/14/19 1409 11/15/19 0354  WBC 14.3* 11.9*  RBC 5.46 5.27  HGB 16.5 16.0  HCT 49.3 47.5  MCV  90.3 90.1  MCH 30.2 30.4  MCHC 33.5 33.7  RDW 13.0 13.2  PLT 354 310    BNPNo results for input(s): BNP, PROBNP in the last 168 hours.   DDimer No results for input(s): DDIMER in the last 168 hours.   Radiology    DG Chest 2 View  Result Date: 11/14/2019 CLINICAL DATA:  Chest pain over the last several days. Recent pancreatitis. EXAM: CHEST - 2 VIEW COMPARISON:  07/02/2017 FINDINGS: The heart size and mediastinal contours are within normal limits. Both lungs are clear. The visualized skeletal structures are unremarkable. IMPRESSION: No active cardiopulmonary disease. Electronically Signed   By: Paulina Fusi M.D.   On: 11/14/2019 14:40   CARDIAC CATHETERIZATION  Result Date: 11/15/2019  Prox RCA lesion is 40% stenosed.  Dist RCA lesion is 99% stenosed.  Prox LAD to Mid LAD lesion is 80% stenosed.  A drug-eluting stent was successfully placed using a SYNERGY XD 3.50X16.  Post intervention, there is a 0% residual stenosis.  A drug-eluting stent was successfully  placed using a SYNERGY XD 4.0X20.  Post intervention, there is a 0% residual stenosis.  The left ventricular ejection fraction is 50-55% by visual estimate.  The left ventricular systolic function is normal.  LV end diastolic pressure is normal.  There is no mitral valve regurgitation.  1. Severe stenosis distal RCA. Successful PTCA/DES x 1 distal RCA 2. Severe stenosis proximal LAD. Successful PTCA/DES x 1 proximal LAD 3. Low normal LV systolic function. Recommendations: DAPT with ASA and Plavix for one month. Since he is also no Eliquis, can stop ASA after one month and continue Plavix along with Eliquis. Resume Eliquis tomorrow if no bleeding from radial artery cath site.    Cardiac Studies   Cath: 11/15/19   Prox RCA lesion is 40% stenosed.  Dist RCA lesion is 99% stenosed.  Prox LAD to Mid LAD lesion is 80% stenosed.  A drug-eluting stent was successfully placed using a SYNERGY XD 3.50X16.  Post intervention, there is a 0% residual stenosis.  A drug-eluting stent was successfully placed using a SYNERGY XD 4.0X20.  Post intervention, there is a 0% residual stenosis.  The left ventricular ejection fraction is 50-55% by visual estimate.  The left ventricular systolic function is normal.  LV end diastolic pressure is normal.  There is no mitral valve regurgitation.   1. Severe stenosis distal RCA. Successful PTCA/DES x 1 distal RCA 2. Severe stenosis proximal LAD. Successful PTCA/DES x 1 proximal LAD 3. Low normal LV systolic function.   Recommendations: DAPT with ASA and Plavix for one month. Since he is also no Eliquis, can stop ASA after one month and continue Plavix along with Eliquis. Resume Eliquis tomorrow if no bleeding from radial artery cath site.   Diagnostic Dominance: Right  Intervention     Patient Profile     49 y.o. male with pmh of aifb RVR recently started on Eliquis, tachypalpitations, WPW, anxiety, asthma/COPD, chronic pancreatitis, obesity,  tobacco abuse, DM2 who was evaluated for chest pain.   Assessment & Plan    1. NSTEMI: Patient presents for progressive chest pain with associated sob. HS troponin 114. EKG with TWI III. CXR unremarkable. Creatinine wnl. Underwent cardiac cath today noted above with 2v PCI/DES to the LAD and RCA. Placed on DAPT with ASA/plavix, plan to resume Eliquis tomorrow.  -- still with ongoing chest pain post cath. Remains on IV nitro. Will order PRN morphine. Echo at the bedside. Reviewed images with MD, appears he  may have occluded a side branch of the LAD. Will plan to resume heparin once TR band removed.  -- check hsTn  2. Paroxysmal Afib: currently in SR. Plan to resume Eliquis in the morning. - continue amiodarone and cardizem  3. HLD - atorvastatin 20 mg daily - LDL 98 in 2017, 65 this admission.  4. WPW/tachypalpitations s/p multiple ablations - continue Cardizem for rate control - Telemetry  5. COPD - Xopenex for wheezing  6. DM2: Hgb A1c 11.1 - SSI while inpatient - consider SGLT2 at discharge  For questions or updates, please contact [place] Please consult [email or web address] for contact info under    Signed, Reino Bellis, NP  11/15/2019, 2:00 PM    Patient seen and examined. Agree with assessment and plan.  Patient underwent cardiac catheterization this morning and was found to have high-grade distal RCA and LAD stenoses.  He underwent successful stenting of his 99% RCA stenosis as well as his diffuse 80% LAD stenosis.  I have personally reviewed the images.  Post procedure the patient did experience substernal chest discomfort and ECG has shown mild ST elevation in V1 and V2.  Post LAD stent, there appears to be subtotal/total occlusion of the moderate sized bifurcating septal perforating artery which was present prior to stent implantation and undoubtably jailed by the stent.  With his mild ongoing recurrent residual chest discomfort we will continue the patient on IV  nitroglycerin today and plan to reinstitute heparin therapy post procedure.  Will obtain serial troponin. Hopefully the jailed septal branch will re-open.Plan pain med PRN. Resume eliquis in am.   Troy Sine, MD, [facility] 11/15/2019 3:47 PM

## 2019-11-15 NOTE — Progress Notes (Signed)
Pt arrived to unit. Cigarettes, lighter, and pocket knife were located in belongings. Placed these items in a name-labeled bag at the nurses station. Pt aware and compliant.

## 2019-11-16 DIAGNOSIS — E1165 Type 2 diabetes mellitus with hyperglycemia: Secondary | ICD-10-CM

## 2019-11-16 DIAGNOSIS — I4892 Unspecified atrial flutter: Secondary | ICD-10-CM

## 2019-11-16 DIAGNOSIS — I25110 Atherosclerotic heart disease of native coronary artery with unstable angina pectoris: Secondary | ICD-10-CM

## 2019-11-16 LAB — APTT: aPTT: 56 seconds — ABNORMAL HIGH (ref 24–36)

## 2019-11-16 LAB — BASIC METABOLIC PANEL
Anion gap: 11 (ref 5–15)
BUN: 9 mg/dL (ref 6–20)
CO2: 24 mmol/L (ref 22–32)
Calcium: 8.7 mg/dL — ABNORMAL LOW (ref 8.9–10.3)
Chloride: 100 mmol/L (ref 98–111)
Creatinine, Ser: 0.69 mg/dL (ref 0.61–1.24)
GFR calc Af Amer: >60 mL/min (ref >60)
GFR calc non Af Amer: >60 mL/min (ref >60)
Glucose, Bld: 208 mg/dL — ABNORMAL HIGH (ref 70–99)
Potassium: 3.9 mmol/L (ref 3.5–5.1)
Sodium: 135 mmol/L (ref 135–145)

## 2019-11-16 LAB — HEPARIN LEVEL (UNFRACTIONATED): Heparin Unfractionated: 0.27 IU/mL — ABNORMAL LOW (ref 0.30–0.70)

## 2019-11-16 LAB — CBC
HCT: 44.1 % (ref 39.0–52.0)
Hemoglobin: 14.5 g/dL (ref 13.0–17.0)
MCH: 29.8 pg (ref 26.0–34.0)
MCHC: 32.9 g/dL (ref 30.0–36.0)
MCV: 90.6 fL (ref 80.0–100.0)
Platelets: 303 10*3/uL (ref 150–400)
RBC: 4.87 MIL/uL (ref 4.22–5.81)
RDW: 13.2 % (ref 11.5–15.5)
WBC: 15.1 10*3/uL — ABNORMAL HIGH (ref 4.0–10.5)
nRBC: 0 % (ref 0.0–0.2)

## 2019-11-16 LAB — GLUCOSE, CAPILLARY
Glucose-Capillary: 256 mg/dL — ABNORMAL HIGH (ref 70–99)
Glucose-Capillary: 208 mg/dL — ABNORMAL HIGH (ref 70–99)
Glucose-Capillary: 187 mg/dL — ABNORMAL HIGH (ref 70–99)
Glucose-Capillary: 229 mg/dL — ABNORMAL HIGH (ref 70–99)

## 2019-11-16 MED ORDER — AMIODARONE HCL 200 MG PO TABS
200.0000 mg | ORAL_TABLET | Freq: Every day | ORAL | Status: DC
  Administered 2019-11-16 – 2019-11-17 (×2): 200 mg via ORAL
  Filled 2019-11-16: qty 1

## 2019-11-16 MED ORDER — ATORVASTATIN CALCIUM 80 MG PO TABS
80.0000 mg | ORAL_TABLET | Freq: Every day | ORAL | Status: DC
  Administered 2019-11-16: 80 mg via ORAL
  Filled 2019-11-16: qty 1

## 2019-11-16 MED ORDER — BISOPROLOL FUMARATE 5 MG PO TABS
5.0000 mg | ORAL_TABLET | Freq: Every day | ORAL | Status: DC
  Administered 2019-11-16 – 2019-11-17 (×2): 5 mg via ORAL
  Filled 2019-11-16 (×2): qty 1

## 2019-11-16 MED ORDER — LIVING WELL WITH DIABETES BOOK
Freq: Once | Status: AC
  Filled 2019-11-16: qty 1

## 2019-11-16 MED ORDER — ISOSORBIDE MONONITRATE ER 30 MG PO TB24
30.0000 mg | ORAL_TABLET | Freq: Every day | ORAL | Status: DC
  Administered 2019-11-16 – 2019-11-17 (×2): 30 mg via ORAL
  Filled 2019-11-16 (×2): qty 1

## 2019-11-16 MED ORDER — INSULIN DETEMIR 100 UNIT/ML ~~LOC~~ SOLN
12.0000 [IU] | Freq: Every day | SUBCUTANEOUS | Status: DC
  Administered 2019-11-16: 12 [IU] via SUBCUTANEOUS
  Filled 2019-11-16 (×2): qty 0.12

## 2019-11-16 MED ORDER — APIXABAN 5 MG PO TABS
5.0000 mg | ORAL_TABLET | Freq: Two times a day (BID) | ORAL | Status: DC
  Administered 2019-11-16 – 2019-11-17 (×3): 5 mg via ORAL
  Filled 2019-11-16 (×3): qty 1

## 2019-11-16 MED ORDER — AMIODARONE HCL 200 MG PO TABS
200.0000 mg | ORAL_TABLET | Freq: Two times a day (BID) | ORAL | Status: DC
  Filled 2019-11-16: qty 1

## 2019-11-16 MED FILL — Heparin Sodium (Porcine) Inj 1000 Unit/ML: INTRAMUSCULAR | Qty: 10 | Status: AC

## 2019-11-16 NOTE — Care Management (Signed)
1030 11-16-19 Case Manager received consult for Eliquis and Jardiance- Benefits check submitted and Case Manager will follow for cost. Gala Lewandowsky, RN,BSN Case Manager [phone]

## 2019-11-16 NOTE — Progress Notes (Addendum)
Inpatient Diabetes Program Recommendations  AACE/ADA: New Consensus Statement on Inpatient Glycemic Control (2015)  Target Ranges:  Prepandial:   less than 140 mg/dL      Peak postprandial:   less than 180 mg/dL (1-2 hours)      Critically ill patients:  140 - 180 mg/dL   Lab Results  Component Value Date   GLUCAP 208 (H) 11/16/2019   HGBA1C 11.1 (H) 11/15/2019    Review of Glycemic Control Results for Dean [name], Dean [name] (MRN [id number]) as of 11/16/2019 11:42  Ref. Range 11/15/2019 21:33 11/16/2019 08:20 11/16/2019 11:16  Glucose-Capillary Latest Ref Range: 70 - 99 mg/dL 195 (H) 093 (H) 267 (H)   Diabetes history: Type 2 DM Outpatient Diabetes medications: Humalog 8 units TID, Metformin 1000 mg BID Current orders for Inpatient glycemic control: Novolog 0-20 units TID, Novolog 0-5 units QHS  Inpatient Diabetes Program Recommendations:    Consider adding Levemir 14 units QHS.   Addendum: Spoke with patient and wife regarding outpatient diabetes management. Patient is followed by PCP and wife verified medications above.  Reviewed patient's current A1c of 11.1%, down from 14% (per patient). Explained what a A1c is and what it measures. Also reviewed goal A1c with patient, importance of good glucose control @ home, and blood sugar goals. Reviewed patho of DM, need for insulin, role of pancreas, beta cell function, impact on cardiac system with poor glycemic control in addition with smoking, vascular changes and commorbidities. Patient has a meter and supplies. Wife reports average for fasting is 240 mg/dL and administers the Humalog per SS. Reviewed when to call MD. [facility] Josephine Igo and patient was not interested.  Denies drinking sugary beverages, however reports intermittently snacking throughout the day. Wife denies patient eating three meals per day. Encouraged protein with carbohydrate intake. Offered outpatient education, however, patient not interested.  Reviewed Jardiance with patient  to include: action, benefit, side effects and when to call MD.  Wife to make PCP appointment for patient and will plan to attach endocrinology list to discharge summary.  Secure chat sent to MD/NP/RN regarding recommendations and based on glucose trends and A1C would recommend at discharge.   Spoke with Mardella Layman, NP. Orders received.  For discharge:  Levemir Flex pen- [id number] Insulin pen needles- #[id number]   Thanks, Lujean Rave, MSN, RNC-OB Diabetes Coordinator [phone] (8a-5p)

## 2019-11-16 NOTE — Progress Notes (Signed)
CARDIAC REHAB PHASE I   PRE:  Rate/Rhythm: 82 SR  BP:  Supine:   Sitting: 118/64  Standing:    SaO2: 92%RA  MODE:  Ambulation: 320 ft   POST:  Rate/Rhythm: 85 SR  BP:  Supine:   Sitting: 127/80  Standing:    SaO2: 94%RA [id number] Pt walked 320 ft on RA pushing IV pole. CP at 3-4/10 prior to walk and increased to 6-7/10. Cut walk short and pt back to bed. During walk right leg buckled slightly. Pt stated he needs left hip replacement. Wife stated he compensates with right leg. Watched pt's HR while education and when HR to 120 he did feel a little tighter in chest but stayed 6-7/10. Wife present for ed. Reviewed importance of plavix with stent, NTG use, MI restrictions, heart healthy and low carb foods, CRP 2. Gave pt smoking cessation handout. He has cut down to one half pack and plans to quit now. Will refer to [place] CRP 2. Will follow up tomorrow. Notified pt's RN that -pt would like something for pain. Pt is interested in participating in Virtual Cardiac and Pulmonary Rehab. Pt advised that Virtual Cardiac and Pulmonary Rehab is provided at no cost to the patient.  Checklist:  1. Pt has smart device  ie smartphone and/or ipad for downloading an app  Yes 2. Reliable internet/wifi service    Yes 3. Understands how to use their smartphone and navigate within an app.  No  Wife stated she will help him    Pt verbalized understanding and is in agreement.    Luetta Nutting, RN BSN  11/16/2019 9:52 AM

## 2019-11-16 NOTE — Progress Notes (Signed)
ANTICOAGULATION CONSULT NOTE   Pharmacy Consult for Heparin (Apixaban on hold) Indication: chest pain/ACS and atrial fibrillation  Allergies  Allergen Reactions  . Prednisone Other (See Comments)    "goes crazy"    Patient Measurements: Height: 6\' 7"  (200.7 cm) Weight: (!) 312 lb 6.4 oz (141.7 kg) IBW/kg (Calculated) : 93.7 Heparin Dosing Weight: 122.3kg  Vital Signs: Temp: 97.9 F (36.6 C) (04/01 0000) Temp Source: Oral (04/01 0000) BP: 124/77 (04/01 0204) Pulse Rate: 76 (04/01 0000)  Labs: Recent Labs    11/14/19 1409 11/14/19 1705 11/15/19 0354 11/15/19 1450 11/15/19 1634 11/16/19 0234 11/16/19 0424  HGB 16.5   < > 16.0  --   --   --  14.5  HCT 49.3  --  47.5  --   --   --  44.1  PLT 354  --  310  --   --   --  303  APTT  --   --  38*  --   --  56*  --   HEPARINUNFRC  --   --  0.19*  --   --   --   --   CREATININE 0.78  --  0.79  --   --   --  0.69  TROPONINIHS 114*   < > 102* 216* 775*  --   --    < > = values in this interval not displayed.    Estimated Creatinine Clearance: 182.3 mL/min (by C-G formula based on SCr of 0.69 mg/dL).   Medical History: Past Medical History:  Diagnosis Date  . Anxiety   . Asthma   . Atrial arrhythmia    multiples with 5-6 ablations  . Atrial fibrillation with rapid ventricular response (HCC)    With rates up to 200 as well as short PR tachycardia, cycle length of 240 ms.  . Chest tightness 05/08/14  . Diabetes mellitus, type II (HCC)   . GERD (gastroesophageal reflux disease)   . Morbid obesity (HCC)   . Palpitations 05/08/14  . Pancreatitis   . Pneumonia ~ 2010  . Snores    Wil occasional apnea. Had a remote sleep study but does not remember results.  . SOB (shortness of breath) 05/08/14  . SVT (supraventricular tachycardia) (HCC)   . WPW (Wolff-Parkinson-White syndrome)    Assessment: 67 yoM on apixaban PTA for PAF (last dose 3/30 at 0800 PTA) admitted with CP. Pt now s/p DES with ongoing CP after procedure and  pharmacy consulted to restart IV heparin post-procedure. APTT subtherapeutic at 56 - per RN, IV access was lost for about an hour at 0240, drip resumed at 0340. Will re-time level from resumption. CBC wnl. No bleeding issues per RN.  Goal of Therapy:  Heparin level 0.3-0.7 units/ml aPTT 66-102 seconds Monitor platelets by anticoagulation protocol: Yes   Plan:  Continue heparin at 1650 units/hr for now Check 6hr aPTT from resumption earlier this morning Monitor daily heparin level/aPTT/CBC, s/sx bleeding   4/30, PharmD, BCPS Please check AMION for all [facility] contact numbers Clinical Pharmacist 11/16/2019 5:28 AM

## 2019-11-16 NOTE — Care Management (Signed)
Per [employer] B. W/ [employer]: Co-pay amount  For Eliquis 2.5 and or 50m bid has co-pay of $40.00 for a 30 day supply. Jardiance 174mdaily for 30 day supply $40.00.   No PA required Deductible not met $300.00 Tier 3 Pharmacy: [facility]  [phone]

## 2019-11-16 NOTE — Progress Notes (Addendum)
Progress Note  Patient Name: Dean LeisureRobert Mayden Date of Encounter: 11/16/2019  Primary Cardiologist: No primary care provider on file.   Subjective   Had some brief episodes of chest pain throughout the evening. None this morning.   Inpatient Medications    Scheduled Meds: . amiodarone  200 mg Oral Daily  . aspirin EC  81 mg Oral Daily  . atorvastatin  20 mg Oral q1800  . clopidogrel  75 mg Oral Q breakfast  . dicyclomine  20 mg Oral BID  . diltiazem  240 mg Oral Daily  . fluticasone furoate-vilanterol  1 puff Inhalation Daily  . insulin aspart  0-20 Units Subcutaneous TID WC  . insulin aspart  0-5 Units Subcutaneous QHS  . loratadine  10 mg Oral Daily  . montelukast  10 mg Oral BH-q7a  . nicotine  14 mg Transdermal Daily  . pantoprazole  40 mg Oral Daily  . sodium chloride flush  3 mL Intravenous Q12H  . sodium chloride flush  3 mL Intravenous Q12H  . sodium chloride flush  3 mL Intravenous Q12H  . sucralfate  1 g Oral TID WC & HS   Continuous Infusions: . sodium chloride    . sodium chloride    . heparin 1,650 Units/hr (11/16/19 0340)  . nitroGLYCERIN 50 mcg/min (11/16/19 0645)   PRN Meds: sodium chloride, sodium chloride, acetaminophen, ALPRAZolam, levalbuterol, morphine injection, nitroGLYCERIN, ondansetron (ZOFRAN) IV, sodium chloride flush, sodium chloride flush, zolpidem   Vital Signs    Vitals:   11/16/19 0204 11/16/19 0537 11/16/19 0610 11/16/19 0639  BP: 124/77 115/68 118/71 126/78  Pulse:  75    Resp:  18    Temp:  97.6 F (36.4 C)    TempSrc:  Oral    SpO2:  98%    Weight:  (!) 141 kg    Height:        Intake/Output Summary (Last 24 hours) at 11/16/2019 0809 Last data filed at 11/16/2019 0547 Gross per 24 hour  Intake 480 ml  Output 1850 ml  Net -1370 ml   Last 3 Weights 11/16/2019 11/15/2019 11/14/2019  Weight (lbs) 310 lb 12.8 oz 312 lb 6.4 oz 311 lb  Weight (kg) 140.978 kg 141.704 kg 141.069 kg      Telemetry    SR with bursts of aflutter -  Personally Reviewed  ECG    Aflutter rate 117 - Personally Reviewed  Physical Exam  Pleasant younger WM GEN: No acute distress.   Neck: No JVD Cardiac: RRR, no murmurs, rubs, or gallops.  Respiratory: Clear to auscultation bilaterally. GI: Soft, nontender, non-distended  MS: No edema; No deformity. Right radial cath site stable.  Neuro:  Nonfocal  Psych: Normal affect   Labs    High Sensitivity Troponin:   Recent Labs  Lab 11/14/19 1705 11/15/19 0142 11/15/19 0354 11/15/19 1450 11/15/19 1634  TROPONINIHS 129* 100* 102* 216* 775*      Chemistry Recent Labs  Lab 11/14/19 1409 11/15/19 0354 11/16/19 0424  NA 137 137 135  K 4.7 3.8 3.9  CL 100 98 100  CO2 26 28 24   GLUCOSE 205* 257* 208*  BUN 13 11 9   CREATININE 0.78 0.79 0.69  CALCIUM 9.8 9.3 8.7*  GFRNONAA >60 >60 >60  GFRAA >60 >60 >60  ANIONGAP 11 11 11      Hematology Recent Labs  Lab 11/14/19 1409 11/15/19 0354 11/16/19 0424  WBC 14.3* 11.9* 15.1*  RBC 5.46 5.27 4.87  HGB 16.5 16.0 14.5  HCT 49.3 47.5 44.1  MCV 90.3 90.1 90.6  MCH 30.2 30.4 29.8  MCHC 33.5 33.7 32.9  RDW 13.0 13.2 13.2  PLT 354 310 303   Lipid Panel     Component Value Date/Time   CHOL 124 11/15/2019 0354   TRIG 128 11/15/2019 0354   HDL 33 (L) 11/15/2019 0354   CHOLHDL 3.8 11/15/2019 0354   VLDL 26 11/15/2019 0354   LDLCALC 65 11/15/2019 0354    BNPNo results for input(s): BNP, PROBNP in the last 168 hours.   DDimer No results for input(s): DDIMER in the last 168 hours.   Radiology    DG Chest 2 View  Result Date: 11/14/2019 CLINICAL DATA:  Chest pain over the last several days. Recent pancreatitis. EXAM: CHEST - 2 VIEW COMPARISON:  07/02/2017 FINDINGS: The heart size and mediastinal contours are within normal limits. Both lungs are clear. The visualized skeletal structures are unremarkable. IMPRESSION: No active cardiopulmonary disease. Electronically Signed   By: Paulina Fusi M.D.   On: 11/14/2019 14:40    CARDIAC CATHETERIZATION  Result Date: 11/15/2019  Prox RCA lesion is 40% stenosed.  Dist RCA lesion is 99% stenosed.  Prox LAD to Mid LAD lesion is 80% stenosed.  A drug-eluting stent was successfully placed using a SYNERGY XD 3.50X16.  Post intervention, there is a 0% residual stenosis.  A drug-eluting stent was successfully placed using a SYNERGY XD 4.0X20.  Post intervention, there is a 0% residual stenosis.  The left ventricular ejection fraction is 50-55% by visual estimate.  The left ventricular systolic function is normal.  LV end diastolic pressure is normal.  There is no mitral valve regurgitation.  1. Severe stenosis distal RCA. Successful PTCA/DES x 1 distal RCA 2. Severe stenosis proximal LAD. Successful PTCA/DES x 1 proximal LAD 3. Low normal LV systolic function. Recommendations: DAPT with ASA and Plavix for one month. Since he is also no Eliquis, can stop ASA after one month and continue Plavix along with Eliquis. Resume Eliquis tomorrow if no bleeding from radial artery cath site.   ECHOCARDIOGRAM COMPLETE  Result Date: 11/15/2019    ECHOCARDIOGRAM REPORT   Patient Name:   Dean [name] Date of Exam: 11/15/2019 Medical Rec #:  [id number]   Height:       79.0 in Accession #:    [id number]  Weight:       312.4 lb Date of Birth:  1971-06-17   BSA:          2.755 m Patient Age:    49 years    BP:           129/83 mmHg Patient Gender: M           HR:           68 bpm. Exam Location:  Inpatient Procedure: 2D Echo, Color Doppler, Cardiac Doppler and Intracardiac            Opacification Agent Indications:    R07.9* Chest pain, unspecified  History:        Patient has prior history of Echocardiogram examinations, most                 recent 03/02/2016. NSTEMI, COPD, Arrythmias:Atrial Fibrillation;                 Risk Factors:Diabetes, Hypertension and Dyslipidemia. Wolf                 Parkinson White Syndrome.  Sonographer:    Irving Burton Senior RDCS  Referring Phys: [id number] CADENCE H FURTH   Sonographer Comments: Technically difficult due to patient body habitus IMPRESSIONS  1. Left ventricular ejection fraction, by estimation, is 45 to 50%. The left ventricle has mildly decreased function. The left ventricle demonstrates regional wall motion abnormalities (see scoring diagram/findings for description). Left ventricular diastolic parameters are indeterminate. There is mild hypokinesis of the left ventricular, basal-mid anteroseptal wall and anterior segment. There is mild hypokinesis of the left ventricular, mid-apical inferoseptal wall.  2. Right ventricular systolic function is low normal. The right ventricular size is normal.  3. The mitral valve is normal in structure. Trivial mitral valve regurgitation. No evidence of mitral stenosis.  4. The aortic valve was not well visualized. Aortic valve regurgitation is not visualized. No aortic stenosis is present.  5. The inferior vena cava is normal in size with <50% respiratory variability, suggesting right atrial pressure of 8 mmHg. Comparison(s): Changes from prior study are noted. FINDINGS  Left Ventricle: Left ventricular ejection fraction, by estimation, is 45 to 50%. The left ventricle has mildly decreased function. The left ventricle demonstrates regional wall motion abnormalities. Mild hypokinesis of the left ventricular, basal-mid anteroseptal wall and anterior segment. Mild hypokinesis of the left ventricular, mid-apical inferoseptal wall. Definity contrast agent was given IV to delineate the left ventricular endocardial borders. The left ventricular internal cavity size was normal in size. There is no left ventricular hypertrophy. Left ventricular diastolic parameters are indeterminate. Right Ventricle: The right ventricular size is normal. Right vetricular wall thickness was not assessed. Right ventricular systolic function is low normal. Left Atrium: Left atrial size was normal in size. Right Atrium: Right atrial size was normal in size.  Pericardium: There is no evidence of pericardial effusion. Mitral Valve: The mitral valve is normal in structure. Trivial mitral valve regurgitation. No evidence of mitral valve stenosis. Tricuspid Valve: The tricuspid valve is normal in structure. Tricuspid valve regurgitation is trivial. No evidence of tricuspid stenosis. Aortic Valve: The aortic valve was not well visualized. Aortic valve regurgitation is not visualized. No aortic stenosis is present. Pulmonic Valve: The pulmonic valve was not well visualized. Pulmonic valve regurgitation is not visualized. No evidence of pulmonic stenosis. Aorta: The aortic root, ascending aorta and aortic arch are all structurally normal, with no evidence of dilitation or obstruction. Venous: The inferior vena cava is normal in size with less than 50% respiratory variability, suggesting right atrial pressure of 8 mmHg. IAS/Shunts: The atrial septum is grossly normal.  LEFT VENTRICLE PLAX 2D LVIDd:         5.50 cm  Diastology LVIDs:         4.20 cm  LV e' lateral:   9.57 cm/s LV PW:         1.00 cm  LV E/e' lateral: 7.4 LV IVS:        0.90 cm  LV e' medial:    6.09 cm/s LVOT diam:     2.60 cm  LV E/e' medial:  11.6 LV SV:         89 LV SV Index:   32 LVOT Area:     5.31 cm  RIGHT VENTRICLE RV S prime:     8.92 cm/s TAPSE (M-mode): 1.6 cm LEFT ATRIUM             Index       RIGHT ATRIUM           Index LA diam:        3.90 cm 1.42 cm/m  RA Area:     24.20 cm LA Vol (A2C):   86.7 ml 31.48 ml/m RA Volume:   79.90 ml  29.01 ml/m LA Vol (A4C):   61.2 ml 22.22 ml/m LA Biplane Vol: 76.0 ml 27.59 ml/m  AORTIC VALVE LVOT Vmax:   87.50 cm/s LVOT Vmean:  63.900 cm/s LVOT VTI:    0.167 m  AORTA Ao Root diam: 3.20 cm MITRAL VALVE MV Area (PHT): 3.53 cm    SHUNTS MV Decel Time: 215 msec    Systemic VTI:  0.17 m MV E velocity: 70.70 cm/s  Systemic Diam: 2.60 cm MV A velocity: 76.30 cm/s MV E/A ratio:  0.93 Jodelle Red MD Electronically signed by Jodelle Red MD  Signature Date/Time: 11/15/2019/7:58:38 PM    Final     Cardiac Studies   Cath: 11/15/19   Prox RCA lesion is 40% stenosed.  Dist RCA lesion is 99% stenosed.  Prox LAD to Mid LAD lesion is 80% stenosed.  A drug-eluting stent was successfully placed using a SYNERGY XD 3.50X16.  Post intervention, there is a 0% residual stenosis.  A drug-eluting stent was successfully placed using a SYNERGY XD 4.0X20.  Post intervention, there is a 0% residual stenosis.  The left ventricular ejection fraction is 50-55% by visual estimate.  The left ventricular systolic function is normal.  LV end diastolic pressure is normal.  There is no mitral valve regurgitation.   1. Severe stenosis distal RCA. Successful PTCA/DES x 1 distal RCA 2. Severe stenosis proximal LAD. Successful PTCA/DES x 1 proximal LAD 3. Low normal LV systolic function.   Recommendations: DAPT with ASA and Plavix for one month. Since he is also no Eliquis, can stop ASA after one month and continue Plavix along with Eliquis. Resume Eliquis tomorrow if no bleeding from radial artery cath site.   Diagnostic Dominance: Right  Intervention   Echo: 11/15/19  IMPRESSIONS    1. Left ventricular ejection fraction, by estimation, is 45 to 50%. The  left ventricle has mildly decreased function. The left ventricle  demonstrates regional wall motion abnormalities (see scoring  diagram/findings for description). Left ventricular  diastolic parameters are indeterminate. There is mild hypokinesis of the  left ventricular, basal-mid anteroseptal wall and anterior segment. There  is mild hypokinesis of the left ventricular, mid-apical inferoseptal wall.  2. Right ventricular systolic function is low normal. The right  ventricular size is normal.  3. The mitral valve is normal in structure. Trivial mitral valve  regurgitation. No evidence of mitral stenosis.  4. The aortic valve was not well visualized. Aortic valve  regurgitation  is not visualized. No aortic stenosis is present.  5. The inferior vena cava is normal in size with <50% respiratory  variability, suggesting right atrial pressure of 8 mmHg.   Comparison(s): Changes from prior study are noted.   Patient Profile     49 y.o. male with pmh of aifb RVRrecently startedonEliquis,tachypalpitations,WPW, anxiety, asthma/COPD, chronic pancreatitis, obesity, tobacco abuse, DM2who was evaluated for chest pain.    Assessment & Plan    1. NSTEMI: Patient presented for progressive chest pain with associated sob. Underwent cardiac cath noted above with 2v PCI/DES to the LAD and RCA. Placed on DAPT with ASA/plavix. Had ongoing chest pain post cath and continued on IV heparin overnight. Improved this morning. Will wean nitro and plan to stop IV heparin. Needs to ambulate with cardiac rehab. hsTn up to 775 post cath. Echo with mildly reduced EF of 45-50% with hypokinesis in the ventricular,  basal-mid anteroseptal wall and anterior segment, along with mild hypokinesis of the left ventricular, mid-apical inferoseptal wall.  -- start Imdur 30mg  -- restart Eliquis today, plan for triple therapy ASA/plavix/Eliquis for one month then stop ASA.   2. Paroxysmal Afib/flutter: currently in SR. Having bursts of Aflutter with rapid rates. Increase amiodarone to 200mg  BID. Eliquis as above.  - continue cardizem  3. HLD - atorvastatin 20 mg daily - LDL 98 in 2017, 65 this admission.  4. WPW/tachypalpitationss/p multiple ablations - continue Cardizemfor rate control - Telemetry  5. COPD -Xopenex for wheezing  6. DM2: Hgb A1c 11.1 - SSI while inpatient - consider SGLT2 at discharge, will ask for CM consult.   For questions or updates, please contact [place] Please consult [email or web address] for contact info under   Signed, Reino Bellis, NP  11/16/2019, 8:09 AM     Patient seen and examined. Agree with assessment and plan.  Dean [name] chest pain  intermittently through the night as well as transient recurrent brief episodes of atrial flutter.  Presently, chest pain has essentially resolved.  Troponin is slightly increased to 775.  He has been on Xopenex and still has expiratory rhonchi with faint wheezing.  Currently he is in sinus rhythm with heart rate at 80.  With his wheezing, will continue amiodarone at the current 200 mg dose rather than increase.  (He has been on IV amiodarone for 3 years and had issues with the twice daily regimen in the past) Will initiate low-dose cardioselective beta-blockade with bisoprolol 5 mg today.  EF 45 to 50% with septal wall motion abnormality.  Will wean and DC intravenous nitroglycerin and transition to oral nitrate therapy.  Will DC heparin and transition to Eliquis today.  He will need significantly improved diabetic management.  Recommend initiation of Jardiance at discharge.  We will keep today.  Ambulate with cardiac rehab.  Tentatively aim for discharge tomorrow.   Troy Sine, MD, [facility] 11/16/2019 8:38 AM

## 2019-11-16 NOTE — Progress Notes (Signed)
Patient had 5/10 chest pain on and off throughout the night. Increased iv nitro and gave PRN iv morphine. Patient was able to get some rest last night but did wake up with some chest discomfort 6/10. Increased iv nitro, obtained EKG and will give PRN iv morphine. Will continue to monitor patient.

## 2019-11-16 NOTE — Discharge Instructions (Signed)
[facility] [phone]) 1. Dr. Philemon Kingdom 2. Dr. Jan[facility] [phone]) 1. Dr. Delrae Rend [facility] [phone]) 1. Dr. Jacelyn Pi 2. Dr. Anda Kraft [facility] [phone]) 1. Dr. Daneil Dolin [facility] [phone]) [ office]  [phone]) [[place] office] 1. Dr. Lenna Sciara Solum 2. Dr. Judithann Sheen [facility]) [phone]) 1. Autumn Hudnall Ronnald Ramp), PA 2. Dr. Amalia Greenhouse 3. Dr. Marsh Dolly. [facility] [phone]) 1. Dr. Glade Lloyd [facility] [phone]) 1. Dr. Orville Govern 2. Dr. Lelon Huh 3. Dr. Jerelene Redden 4. Alwyn Ren, FNP Dr. Carolynn Serve. [place] in [place] [phone])   Preventing Diabetes Mellitus Complications You can take action to prevent or slow down problems that are caused by diabetes (diabetes mellitus). Following your diabetes plan and taking care of yourself can reduce your risk of serious or life-threatening complications. What actions can I take to prevent diabetes complications? Manage your diabetes   Follow instructions from your health care providers about managing your diabetes. Your diabetes may be managed by a team of health care providers who can teach you how to care for yourself and can answer questions that you have.  Educate yourself about your condition so you can make healthy choices about eating and physical activity.  Check your blood sugar (glucose) levels as often as directed. Your health care provider will help you decide how often to check your blood glucose level depending on your treatment goals and how well you are meeting them.  Ask your health care provider if you should take low-dose aspirin daily and what dose is recommended for you. Taking low-dose aspirin daily is  recommended to help prevent cardiovascular disease. Do not use nicotine or tobacco Do not use any products that contain nicotine or tobacco, such as cigarettes and e-cigarettes. If you need help quitting, ask your health care provider. Nicotine raises your risk for diabetes problems. If you quit using nicotine:  You will lower your risk for heart attack, stroke, nerve disease, and kidney disease.  Your cholesterol and blood pressure may improve.  Your blood circulation will improve. Keep your blood pressure under control Your personal target blood pressure is determined based on:  Your age.  Your medicines.  How long you have had diabetes.  Any other medical conditions you have. To control your blood pressure:  Follow instructions from your health care provider about meal planning, exercise, and medicines.  Make sure your health care provider checks your blood pressure at every medical visit.  Monitor your blood pressure at home as told by your health care provider.  Keep your cholesterol under control To control your cholesterol:  Follow instructions from your health care provider about meal planning, exercise, and medicines.  Have your cholesterol checked at least once a year.  You may be prescribed medicine to lower cholesterol (statin). If you are not taking a statin, ask your health care provider if you should be. Controlling your cholesterol may:  Help prevent heart disease and stroke. These are the most common health problems for people with diabetes.  Improve your blood flow. Schedule and keep yearly physical exams and eye exams Your health care provider will tell you how often you need medical visits depending on your diabetes management plan. Keep all follow-up visits as directed. This is important so possible problems can be identified early and complications can be avoided or treated.  Every visit with your health  care provider should include measuring  your: ? Weight. ? Blood pressure. ? Blood glucose control.  Your A1c (hemoglobin A1c) level should be checked: ? At least 2 times a year, if you are meeting your treatment goals. ? 4 times a year, if you are not meeting treatment goals or if your treatment goals have changed.  Your blood lipids (lipid profile) should be checked yearly. You should also be checked yearly for protein in your urine (urine microalbumin).  If you have type 1 diabetes, get an eye exam 3-5 years after you are diagnosed, and then once a year after your first exam.  If you have type 2 diabetes, get an eye exam as soon as you are diagnosed, and then once a year after your first exam. Keep your vaccines current It is recommended that you receive:  A flu (influenza) vaccine every year.  A pneumonia (pneumococcal) vaccine and a hepatitis B vaccine. If you are age 85 or older, you may get the pneumonia vaccine as a series of two separate shots. Ask your health care provider which other vaccines may be recommended. Take care of your feet Diabetes may cause you to have poor blood circulation to your legs and feet. Because of this, taking care of your feet is very important. Diabetes can cause:  The skin on the feet to get thinner, break more easily, and heal more slowly.  Nerve damage in your legs and feet, which results in decreased feeling. You may not notice minor injuries that could lead to serious problems. To avoid foot problems:  Check your skin and feet every day for cuts, bruises, redness, blisters, or sores.  Schedule a foot exam with your health care provider once every year. This exam includes: ? Inspecting of the structure and skin of your feet. ? Checking the pulses and sensation in your feet.  Make sure that your health care provider performs a visual foot exam at every medical visit.  Take care of your teeth People with poorly controlled diabetes are more likely to have gum (periodontal) disease.  Diabetes can make periodontal diseases harder to control. If not treated, periodontal diseases can lead to tooth loss. To prevent this:  Brush your teeth twice a day.  Floss at least once a day.  Visit your dentist 2 times a year. Drink responsibly Limit alcohol intake to no more than 1 drink a day for nonpregnant women and 2 drinks a day for men. One drink equals 12 oz of beer, 5 oz of wine, or 1 oz of hard liquor.  It is important to eat food when you drink alcohol to avoid low blood glucose (hypoglycemia). Avoid alcohol if you:  Have a history of alcohol abuse or dependence.  Are pregnant.  Have liver disease, pancreatitis, advanced neuropathy, or severe hypertriglyceridemia. Lessen stress Living with diabetes can be stressful. When you are experiencing stress, your blood glucose may be affected in two ways:  Stress hormones may cause your blood glucose to rise.  You may be distracted from taking good care of yourself. Be aware of your stress level and make changes to help you manage challenging situations. To lower your stress levels:  Consider joining a support group.  Do planned relaxation or meditation.  Do a hobby that you enjoy.  Maintain healthy relationships.  Exercise regularly.  Work with your health care provider or a mental health professional. Summary  You can take action to prevent or slow down problems that are caused by diabetes (  diabetes mellitus). Following your diabetes plan and taking care of yourself can reduce your risk of serious or life-threatening complications.  Follow instructions from your health care providers about managing your diabetes. Your diabetes may be managed by a team of health care providers who can teach you how to care for yourself and can answer questions that you have.  Your health care provider will tell you how often you need medical visits depending on your diabetes management plan. Keep all follow-up visits as directed. This  is important so possible problems can be identified early and complications can be avoided or treated. This information is not intended to replace advice given to you by your health care provider. Make sure you discuss any questions you have with your health care provider. Document Revised: 11/01/2017 Document Reviewed: 05/02/2016 Elsevier Patient Education  2020 [employer] Hemoglobin A1c Test Why am I having this test? You may have the hemoglobin A1c test (HbA1c test) done to:  Evaluate your risk for developing diabetes (diabetes mellitus).  Diagnose diabetes.  Monitor long-term control of blood sugar (glucose) in people who have diabetes and help make treatment decisions. This test may be done with other blood glucose tests, such as fasting blood glucose and oral glucose tolerance tests. What is being tested? Hemoglobin is a type of protein in the blood that carries oxygen. Glucose attaches to hemoglobin to form glycated hemoglobin. This test checks the amount of glycated hemoglobin in your blood, which is a good indicator of the average amount of glucose in your blood during the past 2-3 months. What kind of sample is taken?  A blood sample is required for this test. It is usually collected by inserting a needle into a blood vessel. Tell a health care provider about:  All medicines you are taking, including vitamins, herbs, eye drops, creams, and over-the-counter medicines.  Any blood disorders you have.  Any surgeries you have had.  Any medical conditions you have.  Whether you are pregnant or may be pregnant. How are the results reported? Your results will be reported as a percentage that indicates how much of your hemoglobin has glucose attached to it (is glycated). Your health care provider will compare your results to normal ranges that were established after testing a large group of people (reference ranges). Reference ranges may vary among labs and hospitals. For this  test, common reference ranges are:  Adult or child without diabetes: 4-5.6%.  Adult or child with diabetes and good blood glucose control: less than 7%. What do the results mean? If you have diabetes:  A result of less than 7% is considered normal, meaning that your blood glucose is well controlled.  A result higher than 7% means that your blood glucose is not well controlled, and your treatment plan may need to be adjusted. If you do not have diabetes:  A result within the reference range is considered normal, meaning that you are not at high risk for diabetes.  A result of 5.7-6.4% means that you have a high risk of developing diabetes, and you may have prediabetes. Prediabetes is the condition of having a blood glucose level that is higher than it should be, but not high enough for you to be diagnosed with diabetes. Having prediabetes puts you at risk for developing type 2 diabetes (type 2 diabetes mellitus). You may have more tests, including a repeat HbA1c test.  Results of 6.5% or higher on two separate HbA1c tests mean that you have diabetes. You may  have more tests to confirm the diagnosis. Abnormally low HbA1c values may be caused by:  Pregnancy.  Severe blood loss.  Receiving donated blood (transfusions).  Low red blood cell count (anemia).  Long-term kidney failure.  Some unusual forms (variants) of hemoglobin. Talk with your health care provider about what your results mean. Questions to ask your health care provider Ask your health care provider, or the department that is doing the test:  When will my results be ready?  How will I get my results?  What are my treatment options?  What other tests do I need?  What are my next steps? Summary  The hemoglobin A1c test (HbA1c test) may be done to evaluate your risk for developing diabetes, to diagnose diabetes, and to monitor long-term control of blood sugar (glucose) in people who have diabetes and help make  treatment decisions.  Hemoglobin is a type of protein in the blood that carries oxygen. Glucose attaches to hemoglobin to form glycated hemoglobin. This test checks the amount of glycated hemoglobin in your blood, which is a good indicator of the average amount of glucose in your blood during the past 2-3 months.  Talk with your health care provider about what your results mean. This information is not intended to replace advice given to you by your health care provider. Make sure you discuss any questions you have with your health care provider. Document Revised: 07/16/2017 Document Reviewed: 03/16/2017 Elsevier Patient Education  2020 [employer] Hyperglycemia Hyperglycemia occurs when the level of sugar (glucose) in the blood is too high. Glucose is a type of sugar that provides the body's main source of energy. Certain hormones (insulin and glucagon) control the level of glucose in the blood. Insulin lowers blood glucose, and glucagon increases blood glucose. Hyperglycemia can result from having too little insulin in the bloodstream, or from the body not responding normally to insulin. Hyperglycemia occurs most often in people who have diabetes (diabetes mellitus), but it can happen in people who do not have diabetes. It can develop quickly, and it can be life-threatening if it causes you to become severely dehydrated (diabetic ketoacidosis or hyperglycemic hyperosmolar state). Severe hyperglycemia is a medical emergency. What are the causes? If you have diabetes, hyperglycemia may be caused by:  Diabetes medicine.  Medicines that increase blood glucose or affect your diabetes control.  Not eating enough, or not eating often enough.  Changes in physical activity level.  Being sick or having an infection. If you have prediabetes or undiagnosed diabetes:  Hyperglycemia may be caused by those conditions. If you do not have diabetes, hyperglycemia may be caused by:  Certain medicines,  including steroid medicines, beta-blockers, epinephrine, and thiazide diuretics.  Stress.  Serious illness.  Surgery.  Diseases of the pancreas.  Infection. What increases the risk? Hyperglycemia is more likely to develop in people who have risk factors for diabetes, such as:  Having a family member with diabetes.  Having a gene for type 1 diabetes that is passed from parent to child (inherited).  Living in an area with cold weather conditions.  Exposure to certain viruses.  Certain conditions in which the body's disease-fighting (immune) system attacks itself (autoimmune disorders).  Being overweight or obese.  Having an inactive (sedentary) lifestyle.  Having been diagnosed with insulin resistance.  Having a history of prediabetes, gestational diabetes, or polycystic ovarian syndrome (PCOS).  Being of American-Indian, African-American, Hispanic/Latino, or Asian/Pacific Islander descent. What are the signs or symptoms? Hyperglycemia may not cause any  symptoms. If you do have symptoms, they may include early warning signs, such as:  Increased thirst.  Hunger.  Feeling very tired.  Needing to urinate more often than usual.  Blurry vision. Other symptoms may develop if hyperglycemia gets worse, such as:  Dry mouth.  Loss of appetite.  Fruity-smelling breath.  Weakness.  Unexpected or rapid weight gain or weight loss.  Tingling or numbness in the hands or feet.  Headache.  Skin that does not quickly return to normal after being lightly pinched and released (poor skin turgor).  Abdominal pain.  Cuts or bruises that are slow to heal. How is this diagnosed? Hyperglycemia is diagnosed with a blood test to measure your blood glucose level. This blood test is usually done while you are having symptoms. Your health care provider may also do a physical exam and review your medical history. You may have more tests to determine the cause of your hyperglycemia,  such as:  A fasting blood glucose (FBG) test. You will not be allowed to eat (you will fast) for at least 8 hours before a blood sample is taken.  An A1c (hemoglobin A1c) blood test. This provides information about blood glucose control over the previous 2-3 months.  An oral glucose tolerance test (OGTT). This measures your blood glucose at two times: ? After fasting. This is your baseline blood glucose level. ? Two hours after drinking a beverage that contains glucose. How is this treated? Treatment depends on the cause of your hyperglycemia. Treatment may include:  Taking medicine to regulate your blood glucose levels. If you take insulin or other diabetes medicines, your medicine or dosage may be adjusted.  Lifestyle changes, such as exercising more, eating healthier foods, or losing weight.  Treating an illness or infection, if this caused your hyperglycemia.  Checking your blood glucose more often.  Stopping or reducing steroid medicines, if these caused your hyperglycemia. If your hyperglycemia becomes severe and it results in hyperglycemic hyperosmolar state, you must be hospitalized and given IV fluids. Follow these instructions at home:  General instructions  Take over-the-counter and prescription medicines only as told by your health care provider.  Do not use any products that contain nicotine or tobacco, such as cigarettes and e-cigarettes. If you need help quitting, ask your health care provider.  Limit alcohol intake to no more than 1 drink per day for nonpregnant women and 2 drinks per day for men. One drink equals 12 oz of beer, 5 oz of wine, or 1 oz of hard liquor.  Learn to manage stress. If you need help with this, ask your health care provider.  Keep all follow-up visits as told by your health care provider. This is important. Eating and drinking   Maintain a healthy weight.  Exercise regularly, as directed by your health care provider.  Stay hydrated,  especially when you exercise, get sick, or spend time in hot temperatures.  Eat healthy foods, such as: ? Lean proteins. ? Complex carbohydrates. ? Fresh fruits and vegetables. ? Low-fat dairy products. ? Healthy fats.  Drink enough fluid to keep your urine clear or pale yellow. If you have diabetes:  Make sure you know the symptoms of hyperglycemia.  Follow your diabetes management plan, as told by your health care provider. Make sure you: ? Take your insulin and medicines as directed. ? Follow your exercise plan. ? Follow your meal plan. Eat on time, and do not skip meals. ? Check your blood glucose as often as directed.  Make sure to check your blood glucose before and after exercise. If you exercise longer or in a different way than usual, check your blood glucose more often. ? Follow your sick day plan whenever you cannot eat or drink normally. Make this plan in advance with your health care provider.  Share your diabetes management plan with people in your workplace, school, and household.  Check your urine for ketones when you are ill and as told by your health care provider.  Carry a medical alert card or wear medical alert jewelry. Contact a health care provider if:  Your blood glucose is at or above 240 mg/dL (82.9 mmol/L) for 2 days in a row.  You have problems keeping your blood glucose in your target range.  You have frequent episodes of hyperglycemia. Get help right away if:  You have difficulty breathing.  You have a change in how you think, feel, or act (mental status).  You have nausea or vomiting that does not go away. These symptoms may represent a serious problem that is an emergency. Do not wait to see if the symptoms will go away. Get medical help right away. Call your local emergency services (911 in the [place] Do not drive yourself to the hospital. Summary  Hyperglycemia occurs when the level of sugar (glucose) in the blood is too  high.  Hyperglycemia is diagnosed with a blood test to measure your blood glucose level. This blood test is usually done while you are having symptoms. Your health care provider may also do a physical exam and review your medical history.  If you have diabetes, follow your diabetes management plan as told by your health care provider.  Contact your health care provider if you have problems keeping your blood glucose in your target range. This information is not intended to replace advice given to you by your health care provider. Make sure you discuss any questions you have with your health care provider. Document Revised: 04/20/2016 Document Reviewed: 04/20/2016 Elsevier Patient Education  2020 [employer].

## 2019-11-17 ENCOUNTER — Encounter (HOSPITAL_COMMUNITY): Payer: Self-pay | Admitting: Cardiovascular Disease

## 2019-11-17 ENCOUNTER — Ambulatory Visit: Payer: BC Managed Care – PPO | Admitting: Student

## 2019-11-17 DIAGNOSIS — Z7901 Long term (current) use of anticoagulants: Secondary | ICD-10-CM

## 2019-11-17 DIAGNOSIS — J449 Chronic obstructive pulmonary disease, unspecified: Secondary | ICD-10-CM

## 2019-11-17 LAB — BASIC METABOLIC PANEL
Anion gap: 12 (ref 5–15)
BUN: 9 mg/dL (ref 6–20)
CO2: 27 mmol/L (ref 22–32)
Calcium: 9.1 mg/dL (ref 8.9–10.3)
Chloride: 98 mmol/L (ref 98–111)
Creatinine, Ser: 0.84 mg/dL (ref 0.61–1.24)
GFR calc Af Amer: >60 mL/min (ref >60)
GFR calc non Af Amer: >60 mL/min (ref >60)
Glucose, Bld: 225 mg/dL — ABNORMAL HIGH (ref 70–99)
Potassium: 3.5 mmol/L (ref 3.5–5.1)
Sodium: 137 mmol/L (ref 135–145)

## 2019-11-17 LAB — CBC
HCT: 46.1 % (ref 39.0–52.0)
Hemoglobin: 15.3 g/dL (ref 13.0–17.0)
MCH: 30.1 pg (ref 26.0–34.0)
MCHC: 33.2 g/dL (ref 30.0–36.0)
MCV: 90.6 fL (ref 80.0–100.0)
Platelets: 288 10*3/uL (ref 150–400)
RBC: 5.09 MIL/uL (ref 4.22–5.81)
RDW: 13.2 % (ref 11.5–15.5)
WBC: 12.3 10*3/uL — ABNORMAL HIGH (ref 4.0–10.5)
nRBC: 0 % (ref 0.0–0.2)

## 2019-11-17 LAB — GLUCOSE, CAPILLARY: Glucose-Capillary: 241 mg/dL — ABNORMAL HIGH (ref 70–99)

## 2019-11-17 MED ORDER — ATORVASTATIN CALCIUM 80 MG PO TABS: 80.0000 mg | ORAL_TABLET | Freq: Every day | ORAL | 1 refills | Status: AC

## 2019-11-17 MED ORDER — NITROGLYCERIN 0.4 MG SL SUBL: 0.4000 mg | SUBLINGUAL_TABLET | SUBLINGUAL | 2 refills | Status: AC | PRN

## 2019-11-17 MED ORDER — DILTIAZEM HCL ER COATED BEADS 240 MG PO CP24: 240.0000 mg | ORAL_CAPSULE | Freq: Every day | ORAL | 1 refills | Status: DC

## 2019-11-17 MED ORDER — BISOPROLOL FUMARATE 5 MG PO TABS: 5.0000 mg | ORAL_TABLET | Freq: Every day | ORAL | 1 refills | Status: DC

## 2019-11-17 MED ORDER — ISOSORBIDE MONONITRATE ER 30 MG PO TB24: 30.0000 mg | ORAL_TABLET | Freq: Every day | ORAL | 1 refills | Status: DC

## 2019-11-17 MED ORDER — CLOPIDOGREL BISULFATE 75 MG PO TABS: 75.0000 mg | ORAL_TABLET | Freq: Every day | ORAL | 2 refills | Status: DC

## 2019-11-17 MED FILL — CLOPIDOGREL 75 MG TABLET: 75 | 30 days supply | Qty: 30 | Fill #0

## 2019-11-17 MED FILL — ATORVASTATIN CALCIUM 80 MG: 80 | 30 days supply | Qty: 30 | Fill #0

## 2019-11-17 MED FILL — NITROGLYCERIN 0.4 MG TAB SL: 0.4 | 8 days supply | Qty: 25 | Fill #0

## 2019-11-17 MED FILL — BISOPROLOL FUMARATE 5 MG TA: 5 | 30 days supply | Qty: 30 | Fill #0

## 2019-11-17 MED FILL — ISOSORBIDE MN ER 30 MG TAB: 30 | 30 days supply | Qty: 30 | Fill #0

## 2019-11-17 MED FILL — CARTIA XT 240 MG CAPSULE: 240 | 30 days supply | Qty: 30 | Fill #0

## 2019-11-17 NOTE — Progress Notes (Addendum)
Progress Note  Patient Name: Dean [name] Date of Encounter: 11/17/2019  Primary Cardiologist: No primary care provider on file.   Subjective   Feeling much better this morning. Hopeful to go home today.   Inpatient Medications    Scheduled Meds: . amiodarone  200 mg Oral Daily  . apixaban  5 mg Oral BID  . aspirin EC  81 mg Oral Daily  . atorvastatin  80 mg Oral q1800  . bisoprolol  5 mg Oral Daily  . clopidogrel  75 mg Oral Q breakfast  . dicyclomine  20 mg Oral BID  . diltiazem  240 mg Oral Daily  . fluticasone furoate-vilanterol  1 puff Inhalation Daily  . insulin aspart  0-20 Units Subcutaneous TID WC  . insulin aspart  0-5 Units Subcutaneous QHS  . insulin detemir  12 Units Subcutaneous QHS  . isosorbide mononitrate  30 mg Oral Daily  . loratadine  10 mg Oral Daily  . montelukast  10 mg Oral BH-q7a  . nicotine  14 mg Transdermal Daily  . pantoprazole  40 mg Oral Daily  . sodium chloride flush  3 mL Intravenous Q12H  . sodium chloride flush  3 mL Intravenous Q12H  . sodium chloride flush  3 mL Intravenous Q12H  . sucralfate  1 g Oral TID WC & HS   Continuous Infusions: . sodium chloride    . sodium chloride     PRN Meds: sodium chloride, sodium chloride, acetaminophen, ALPRAZolam, levalbuterol, morphine injection, nitroGLYCERIN, ondansetron (ZOFRAN) IV, sodium chloride flush, sodium chloride flush, zolpidem   Vital Signs    Vitals:   11/16/19 1328 11/16/19 1343 11/16/19 2045 11/17/19 0505  BP: 126/61 (!) 113/58 119/70 (!) 148/82  Pulse:  77 66 69  Resp:  18 18 20   Temp:  98.1 F (36.7 C) 97.9 F (36.6 C) 97.6 F (36.4 C)  TempSrc:  Oral Oral Oral  SpO2:   96% 98%  Weight:      Height:        Intake/Output Summary (Last 24 hours) at 11/17/2019 0729 Last data filed at 11/16/2019 2300 Gross per 24 hour  Intake 480 ml  Output --  Net 480 ml   Last 3 Weights 11/16/2019 11/15/2019 11/14/2019  Weight (lbs) 310 lb 12.8 oz 312 lb 6.4 oz 311 lb  Weight (kg)  140.978 kg 141.704 kg 141.069 kg      Telemetry    SR-->short bursts of aflutter - Personally Reviewed  ECG    SR - Personally Reviewed  Physical Exam  WM sitting up in chair, eating breakfast GEN: No acute distress.   Neck: No JVD Cardiac: RRR, no murmurs, rubs, or gallops.  Respiratory: Mild wheezing GI: Soft, nontender, non-distended  MS: No edema; No deformity. Neuro:  Nonfocal  Psych: Normal affect   Labs    High Sensitivity Troponin:   Recent Labs  Lab 11/14/19 1705 11/15/19 0142 11/15/19 0354 11/15/19 1450 11/15/19 1634  TROPONINIHS 129* 100* 102* 216* 775*      Chemistry Recent Labs  Lab 11/15/19 0354 11/16/19 0424 11/17/19 0422  NA 137 135 137  K 3.8 3.9 3.5  CL 98 100 98  CO2 28 24 27   GLUCOSE 257* 208* 225*  BUN 11 9 9   CREATININE 0.79 0.69 0.84  CALCIUM 9.3 8.7* 9.1  GFRNONAA >60 >60 >60  GFRAA >60 >60 >60  ANIONGAP 11 11 12      Hematology Recent Labs  Lab 11/15/19 0354 11/16/19 0424 11/17/19 0422  WBC 11.9* 15.1* 12.3*  RBC 5.27 4.87 5.09  HGB 16.0 14.5 15.3  HCT 47.5 44.1 46.1  MCV 90.1 90.6 90.6  MCH 30.4 29.8 30.1  MCHC 33.7 32.9 33.2  RDW 13.2 13.2 13.2  PLT 310 303 288    BNPNo results for input(s): BNP, PROBNP in the last 168 hours.   DDimer No results for input(s): DDIMER in the last 168 hours.   Radiology    CARDIAC CATHETERIZATION  Result Date: 11/15/2019  Prox RCA lesion is 40% stenosed.  Dist RCA lesion is 99% stenosed.  Prox LAD to Mid LAD lesion is 80% stenosed.  A drug-eluting stent was successfully placed using a SYNERGY XD 3.50X16.  Post intervention, there is a 0% residual stenosis.  A drug-eluting stent was successfully placed using a SYNERGY XD 4.0X20.  Post intervention, there is a 0% residual stenosis.  The left ventricular ejection fraction is 50-55% by visual estimate.  The left ventricular systolic function is normal.  LV end diastolic pressure is normal.  There is no mitral valve  regurgitation.  1. Severe stenosis distal RCA. Successful PTCA/DES x 1 distal RCA 2. Severe stenosis proximal LAD. Successful PTCA/DES x 1 proximal LAD 3. Low normal LV systolic function. Recommendations: DAPT with ASA and Plavix for one month. Since he is also no Eliquis, can stop ASA after one month and continue Plavix along with Eliquis. Resume Eliquis tomorrow if no bleeding from radial artery cath site.   ECHOCARDIOGRAM COMPLETE  Result Date: 11/15/2019    ECHOCARDIOGRAM REPORT   Patient Name:   Dean [name] Date of Exam: 11/15/2019 Medical Rec #:  [id number]   Height:       79.0 in Accession #:    [id number]  Weight:       312.4 lb Date of Birth:  March 30, 1971   BSA:          2.755 m Patient Age:    49 years    BP:           129/83 mmHg Patient Gender: M           HR:           68 bpm. Exam Location:  Inpatient Procedure: 2D Echo, Color Doppler, Cardiac Doppler and Intracardiac            Opacification Agent Indications:    R07.9* Chest pain, unspecified  History:        Patient has prior history of Echocardiogram examinations, most                 recent 03/02/2016. NSTEMI, COPD, Arrythmias:Atrial Fibrillation;                 Risk Factors:Diabetes, Hypertension and Dyslipidemia. Wolf                 Parkinson White Syndrome.  Sonographer:    Irving Burton Senior RDCS Referring Phys: [id number] CADENCE H FURTH  Sonographer Comments: Technically difficult due to patient body habitus IMPRESSIONS  1. Left ventricular ejection fraction, by estimation, is 45 to 50%. The left ventricle has mildly decreased function. The left ventricle demonstrates regional wall motion abnormalities (see scoring diagram/findings for description). Left ventricular diastolic parameters are indeterminate. There is mild hypokinesis of the left ventricular, basal-mid anteroseptal wall and anterior segment. There is mild hypokinesis of the left ventricular, mid-apical inferoseptal wall.  2. Right ventricular systolic function is low normal. The right  ventricular size is normal.  3. The mitral valve is normal  in structure. Trivial mitral valve regurgitation. No evidence of mitral stenosis.  4. The aortic valve was not well visualized. Aortic valve regurgitation is not visualized. No aortic stenosis is present.  5. The inferior vena cava is normal in size with <50% respiratory variability, suggesting right atrial pressure of 8 mmHg. Comparison(s): Changes from prior study are noted. FINDINGS  Left Ventricle: Left ventricular ejection fraction, by estimation, is 45 to 50%. The left ventricle has mildly decreased function. The left ventricle demonstrates regional wall motion abnormalities. Mild hypokinesis of the left ventricular, basal-mid anteroseptal wall and anterior segment. Mild hypokinesis of the left ventricular, mid-apical inferoseptal wall. Definity contrast agent was given IV to delineate the left ventricular endocardial borders. The left ventricular internal cavity size was normal in size. There is no left ventricular hypertrophy. Left ventricular diastolic parameters are indeterminate. Right Ventricle: The right ventricular size is normal. Right vetricular wall thickness was not assessed. Right ventricular systolic function is low normal. Left Atrium: Left atrial size was normal in size. Right Atrium: Right atrial size was normal in size. Pericardium: There is no evidence of pericardial effusion. Mitral Valve: The mitral valve is normal in structure. Trivial mitral valve regurgitation. No evidence of mitral valve stenosis. Tricuspid Valve: The tricuspid valve is normal in structure. Tricuspid valve regurgitation is trivial. No evidence of tricuspid stenosis. Aortic Valve: The aortic valve was not well visualized. Aortic valve regurgitation is not visualized. No aortic stenosis is present. Pulmonic Valve: The pulmonic valve was not well visualized. Pulmonic valve regurgitation is not visualized. No evidence of pulmonic stenosis. Aorta: The aortic root,  ascending aorta and aortic arch are all structurally normal, with no evidence of dilitation or obstruction. Venous: The inferior vena cava is normal in size with less than 50% respiratory variability, suggesting right atrial pressure of 8 mmHg. IAS/Shunts: The atrial septum is grossly normal.  LEFT VENTRICLE PLAX 2D LVIDd:         5.50 cm  Diastology LVIDs:         4.20 cm  LV e' lateral:   9.57 cm/s LV PW:         1.00 cm  LV E/e' lateral: 7.4 LV IVS:        0.90 cm  LV e' medial:    6.09 cm/s LVOT diam:     2.60 cm  LV E/e' medial:  11.6 LV SV:         89 LV SV Index:   32 LVOT Area:     5.31 cm  RIGHT VENTRICLE RV S prime:     8.92 cm/s TAPSE (M-mode): 1.6 cm LEFT ATRIUM             Index       RIGHT ATRIUM           Index LA diam:        3.90 cm 1.42 cm/m  RA Area:     24.20 cm LA Vol (A2C):   86.7 ml 31.48 ml/m RA Volume:   79.90 ml  29.01 ml/m LA Vol (A4C):   61.2 ml 22.22 ml/m LA Biplane Vol: 76.0 ml 27.59 ml/m  AORTIC VALVE LVOT Vmax:   87.50 cm/s LVOT Vmean:  63.900 cm/s LVOT VTI:    0.167 m  AORTA Ao Root diam: 3.20 cm MITRAL VALVE MV Area (PHT): 3.53 cm    SHUNTS MV Decel Time: 215 msec    Systemic VTI:  0.17 m MV E velocity: 70.70 cm/s  Systemic Diam: 2.60  cm MV A velocity: 76.30 cm/s MV E/A ratio:  0.93 Jodelle Red MD Electronically signed by Jodelle Red MD Signature Date/Time: 11/15/2019/7:58:38 PM    Final     Cardiac Studies   Cath: 11/15/19   Prox RCA lesion is 40% stenosed.  Dist RCA lesion is 99% stenosed.  Prox LAD to Mid LAD lesion is 80% stenosed.  A drug-eluting stent was successfully placed using a SYNERGY XD 3.50X16.  Post intervention, there is a 0% residual stenosis.  A drug-eluting stent was successfully placed using a SYNERGY XD 4.0X20.  Post intervention, there is a 0% residual stenosis.  The left ventricular ejection fraction is 50-55% by visual estimate.  The left ventricular systolic function is normal.  LV end diastolic pressure  is normal.  There is no mitral valve regurgitation.  1. Severe stenosis distal RCA. Successful PTCA/DES x 1 distal RCA 2. Severe stenosis proximal LAD. Successful PTCA/DES x 1 proximal LAD 3. Low normal LV systolic function.   Recommendations: DAPT with ASA and Plavix for one month. Since he is also no Eliquis, can stop ASA after one month and continue Plavix along with Eliquis. Resume Eliquis tomorrow if no bleeding from radial artery cath site.   Diagnostic Dominance: Right  Intervention   Echo: 11/15/19  IMPRESSIONS    1. Left ventricular ejection fraction, by estimation, is 45 to 50%. The  left ventricle has mildly decreased function. The left ventricle  demonstrates regional wall motion abnormalities (see scoring  diagram/findings for description). Left ventricular  diastolic parameters are indeterminate. There is mild hypokinesis of the  left ventricular, basal-mid anteroseptal wall and anterior segment. There  is mild hypokinesis of the left ventricular, mid-apical inferoseptal wall.  2. Right ventricular systolic function is low normal. The right  ventricular size is normal.  3. The mitral valve is normal in structure. Trivial mitral valve  regurgitation. No evidence of mitral stenosis.  4. The aortic valve was not well visualized. Aortic valve regurgitation  is not visualized. No aortic stenosis is present.  5. The inferior vena cava is normal in size with <50% respiratory  variability, suggesting right atrial pressure of 8 mmHg.   Comparison(s): Changes from prior study are noted.   Patient Profile     49 y.o. male with pmh of aifb RVRrecently startedonEliquis,tachypalpitations,WPW, anxiety, asthma/COPD, chronic pancreatitis, obesity, tobacco abuse, DM2whowasevaluated for chest pain.   Assessment & Plan    1.NSTEMI:Patient presented for progressive chest pain with associated sob. Underwent cardiac cath noted above with 2v PCI/DES to the LAD  and RCA. Placed on DAPT with ASA/plavix. Had ongoing chest pain post cath and continued on IV heparin. Worked with cardiac rehab yesterday and felt tired with some chest pain. Plan to ambulate again today. -- weaned from nitro and started on Imdur 30mg  daily.   -- Echo with mildly reduced EF of 45-50% with hypokinesis in the ventricular, basal-mid anteroseptal wall and anterior segment, along with mild hypokinesis of the left ventricular, mid-apical inferoseptal wall.  -- on triple therapy ASA/plavix/Eliquis for one month then stop ASA  2.Paroxysmal Afib/flutter: currently in SR. Fewer episodes of aflutter noted on telemetry -- on amiodarone 200mg  daily with bisoprolol added yesterday.  -- continue Eliquis  3.HLD: -- atorvastatin 20 mg daily prior to admission-->increased to 80mg  daily -- LDL 98 in 2017, 65 this admission.  4.WPW/tachypalpitationss/p multiple ablations -- continue Cardizem/amiodaronefor rate control  5.COPD --Xopenex for wheezing  6.DM2: Hgb A1c 11.1, reports this is improving. He is managed by his  PCP.  Marlinda Mike inpatient -- consider SGLT2 at discharge, jardiance copay is reasonable. -- seen by Diabetes coordinator    For questions or updates, please contact [facility] Please consult [email or web address] for contact info under   Signed, Laverda Page, NP  11/17/2019, 7:29 AM         Patient seen and examined. Agree with assessment and plan.  Patient feels very well today.  He is able to walk the halls without any chest pain or wheezing.  He continues to have transient bursts of PAF.  When I walked into the room telemetry demonstrated atrial fibrillation at 70 to 80 bpm and while I was in the room he again converted back into sinus rhythm at 68 bpm.  His lungs are clear today without any residual wheezing or rhonchi.  Rhythm is now regular.  There is no edema.  Continue triple drug therapy with aspirin, clopidogrel, and apixaban 5 mg twice a day for 1  month post two-vessel PCI with plans to DC aspirin after 1 month with continuation of Eliquis and Plavix.  Continue isosorbide, diltiazem and he is tolerating bisoprolol well without any COPD/asthma exacerbation.  We will plan discharge today.  Follow-up with Dr. Sharrell Ku who he states is his cardiologist.   Lennette Bihari, MD, [facility] 11/17/2019 9:49 AM

## 2019-11-17 NOTE — Plan of Care (Signed)
Pt d/c to home with self care, left facility via private vehicle.

## 2019-11-17 NOTE — Discharge Summary (Signed)
Discharge Summary    Patient ID: Dean [name],  MRN: [id number], DOB/AGE: 11-12-1970 49 y.o.  Admit date: 11/14/2019 Discharge date: 11/17/2019  Primary Care Provider: Mickle [name] Primary Cardiologist: Dean Bunting, MD  Discharge Diagnoses    Principal Problem:   NSTEMI (non-ST elevated myocardial infarction) [facility]) Active Problems:   Type 2 diabetes mellitus without complication, without long-term current use of insulin (HCC)   Paroxysmal atrial fibrillation (HCC)   Essential hypertension   Mixed hyperlipidemia   Smoker   COPD (chronic obstructive pulmonary disease) (HCC)   Allergies Allergies  Allergen Reactions  . Prednisone Other (See Comments)    "goes crazy"    Diagnostic Studies/Procedures    Cath: 11/15/19   Prox RCA lesion is 40% stenosed.  Dist RCA lesion is 99% stenosed.  Prox LAD to Mid LAD lesion is 80% stenosed.  A drug-eluting stent was successfully placed using a SYNERGY XD 3.50X16.  Post intervention, there is a 0% residual stenosis.  A drug-eluting stent was successfully placed using a SYNERGY XD 4.0X20.  Post intervention, there is a 0% residual stenosis.  The left ventricular ejection fraction is 50-55% by visual estimate.  The left ventricular systolic function is normal.  LV end diastolic pressure is normal.  There is no mitral valve regurgitation.  1. Severe stenosis distal RCA. Successful PTCA/DES x 1 distal RCA 2. Severe stenosis proximal LAD. Successful PTCA/DES x 1 proximal LAD 3. Low normal LV systolic function.   Recommendations: DAPT with ASA and Plavix for one month. Since he is also no Eliquis, can stop ASA after one month and continue Plavix along with Eliquis. Resume Eliquis tomorrow if no bleeding from radial artery cath site.   Diagnostic Dominance: Right  Intervention   Echo: 11/15/19  IMPRESSIONS    1. Left ventricular ejection fraction, by estimation, is 45 to 50%. The  left ventricle has mildly  decreased function. The left ventricle  demonstrates regional wall motion abnormalities (see scoring  diagram/findings for description). Left ventricular  diastolic parameters are indeterminate. There is mild hypokinesis of the  left ventricular, basal-mid anteroseptal wall and anterior segment. There  is mild hypokinesis of the left ventricular, mid-apical inferoseptal wall.  2. Right ventricular systolic function is low normal. The right  ventricular size is normal.  3. The mitral valve is normal in structure. Trivial mitral valve  regurgitation. No evidence of mitral stenosis.  4. The aortic valve was not well visualized. Aortic valve regurgitation  is not visualized. No aortic stenosis is present.  5. The inferior vena cava is normal in size with <50% respiratory  variability, suggesting right atrial pressure of 8 mmHg.   Comparison(s): Changes from prior study are noted.  _____________   History of Present Illness     49 y.o. male with pmh of aifb RVR recently started on Eliquis, tachypalpitations, WPW, anxiety, asthma/COPD, chronic pancreatitis, obesity, tobacco abuse, DM2 who is being evaluated for chest pain.   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 He has seen Dean [name] in the past for WPW, tachypalpitations, and afib. The patient has undergone multiple catheter ablations but had persistent WPW and Afib RVR. Patient improved since being started on amiodarone. Echo in 2017 showed EF 50-55%, mildly dilated RV. He does not have known CAD or prior cardiac testing. He was last seen 09/2016 by Dean [name] and was stable, in sinus.   The patient presented to the ED 11/14/19 for  chest pain. Pain began 4 days ago. The patient was fertilizing plants when the pain began. It was in the middle of his chest and was a tightness, 8/10. Felt sob with the pain. Pain lasted an hour. No N/V, diaphoresis. He took 2 aspirin which mildly improved the  pain. He felt the pain again 2 days prior to admission while at rest. It woke him up from sleep and was on the left side of his chest and radiated down his arm.   In the ED BP 139/86, pulse 79, afebrile, RR 16, 98% O2. Labs showed glucose 205, WBC 14.3, creatinine 0.78, Hgb 16.5.  Hs troponin 114. CXR unremarkable. EKG with sinus and TWI III. He was given Aspirin  in the ER.   The patient reported his last episode of pancreatitis was 2 weeks ago. He quit drinking 3 years ago. He still smokes 1 pack every 2 days. Denied drug use. He works full time at his [employer]) and lives with his wife. Patient says he took Eliquis this morning. He previously was just taking aspirin and was just started on Eliquis by his PCP.   Hospital Course     1.NSTEMI:Patient presentedfor progressive chest pain with associated sob. Underwent cardiac cath noted above with 2v PCI/DES to the LAD and RCA. Placed on DAPT with ASA/plavix. Had ongoing chest pain post cath and continued on IV heparin. There appeared to be subtotal/total occlusion of the moderate sized bifurcating septal perforating artery which was present prior to stent implantation and undoubtably jailed by the stent. He was weaned from nitro and started on Imdur  daily.   -- Echo with mildly reduced EF of 45-50% with hypokinesis in theventricular, basal-mid anteroseptal wall and anterior segment, along withmild hypokinesis of the left ventricular, mid-apical inferoseptal wall.  -- on triple therapy ASA/plavix/Eliquis for one month then stop ASA  2.Paroxysmal Afib/flutter: he was intermittently in and out of Afib throughout admission. Rates were stable.  -- on amiodarone  daily with bisoprolol added. -- continue Eliquis  BID  3.HLD: -- atorvastatin 20 mg daily prior to admission-->increased to  daily -- LDL 98 in 2017, 65 this admission -- will need FLP/LFTs in 8 weeks  4.WPW/tachypalpitationss/p multiple ablations --  continue Cardizem/amiodaronefor rate control  5.COPD --stable throughout admission  6.DM2: Hgb A1c 11.1, reports this is improving. He is managed by his PCP.  -- treated with SSIwhile inpatient -- consider SGLT2 as an outpatient, jardiance copay is reasonable. -- seen by Diabetes coordinator   Pecola Leisure was seen by Dr. Tresa Endo and determined stable for discharge home. Follow up in the office has been arranged. Medications are listed below.   _____________  Discharge Vitals Blood pressure (!) 148/82, pulse 69, temperature 97.6 F (36.4 C), temperature source Oral, resp. rate 20, height  (2.007 m), weight (!) 141.7 kg, SpO2 98 %.  Filed Weights   11/15/19 0226 11/16/19 0537 11/17/19 0505  Weight: (!) 141.7 kg (!) 141 kg (!) 141.7 kg    Labs & Radiologic Studies    CBC Recent Labs    11/16/19 0424 11/17/19 0422  WBC 15.1* 12.3*  HGB 14.5 15.3  HCT 44.1 46.1  MCV 90.6 90.6  PLT 303 288   Basic Metabolic Panel Recent Labs    30/86/57 0142 11/15/19 0354 11/16/19 0424 11/17/19 0422  NA  --    < > 135 137  K  --    < > 3.9 3.5  CL  --    < >  100 98  CO2  --    < > 24 27  GLUCOSE  --    < > 208* 225*  BUN  --    < > 9 9  CREATININE  --    < > 0.69 0.84  CALCIUM  --    < > 8.7* 9.1  MG 1.5*  --   --   --    < > = values in this interval not displayed.   Liver Function Tests No results for input(s): AST, ALT, ALKPHOS, BILITOT, PROT, ALBUMIN in the last 72 hours. No results for input(s): LIPASE, AMYLASE in the last 72 hours. Cardiac Enzymes No results for input(s): CKTOTAL, CKMB, CKMBINDEX, TROPONINI in the last 72 hours. BNP Invalid input(s): POCBNP D-Dimer No results for input(s): DDIMER in the last 72 hours. Hemoglobin A1C Recent Labs    11/15/19 0142  HGBA1C 11.1*   Fasting Lipid Panel Recent Labs    11/15/19 0354  CHOL 124  HDL 33*  LDLCALC 65  TRIG 409  CHOLHDL 3.8   Thyroid Function Tests Recent Labs    11/15/19 0142  TSH 1.907     _____________  DG Chest 2 View  Result Date: 11/14/2019 CLINICAL DATA:  Chest pain over the last several days. Recent pancreatitis. EXAM: CHEST - 2 VIEW COMPARISON:  07/02/2017 FINDINGS: The heart size and mediastinal contours are within normal limits. Both lungs are clear. The visualized skeletal structures are unremarkable. IMPRESSION: No active cardiopulmonary disease. Electronically Signed   By: Paulina Fusi M.D.   On: 11/14/2019 14:40   CARDIAC CATHETERIZATION  Result Date: 11/15/2019  Prox RCA lesion is 40% stenosed.  Dist RCA lesion is 99% stenosed.  Prox LAD to Mid LAD lesion is 80% stenosed.  A drug-eluting stent was successfully placed using a SYNERGY XD 3.50X16.  Post intervention, there is a 0% residual stenosis.  A drug-eluting stent was successfully placed using a SYNERGY XD 4.0X20.  Post intervention, there is a 0% residual stenosis.  The left ventricular ejection fraction is 50-55% by visual estimate.  The left ventricular systolic function is normal.  LV end diastolic pressure is normal.  There is no mitral valve regurgitation.  1. Severe stenosis distal RCA. Successful PTCA/DES x 1 distal RCA 2. Severe stenosis proximal LAD. Successful PTCA/DES x 1 proximal LAD 3. Low normal LV systolic function. Recommendations: DAPT with ASA and Plavix for one month. Since he is also no Eliquis, can stop ASA after one month and continue Plavix along with Eliquis. Resume Eliquis tomorrow if no bleeding from radial artery cath site.   ECHOCARDIOGRAM COMPLETE  Result Date: 11/15/2019    ECHOCARDIOGRAM REPORT   Patient Name:   Dean [name] Date of Exam: 11/15/2019 Medical Rec #:  [id number]   Height:       79.0 in Accession #:    [id number]  Weight:       312.4 lb Date of Birth:  07-08-1971   BSA:          2.755 m Patient Age:    49 years    BP:           129/83 mmHg Patient Gender: M           HR:           68 bpm. Exam Location:  Inpatient Procedure: 2D Echo, Color Doppler, Cardiac Doppler  and Intracardiac            Opacification Agent Indications:    R07.9*  Chest pain, unspecified  History:        Patient has prior history of Echocardiogram examinations, most                 recent 03/02/2016. NSTEMI, COPD, Arrythmias:Atrial Fibrillation;                 Risk Factors:Diabetes, Hypertension and Dyslipidemia. Wolf                 Parkinson White Syndrome.  Sonographer:    Irving Burton Senior RDCS Referring Phys: [id number] CADENCE H FURTH  Sonographer Comments: Technically difficult due to patient body habitus IMPRESSIONS  1. Left ventricular ejection fraction, by estimation, is 45 to 50%. The left ventricle has mildly decreased function. The left ventricle demonstrates regional wall motion abnormalities (see scoring diagram/findings for description). Left ventricular diastolic parameters are indeterminate. There is mild hypokinesis of the left ventricular, basal-mid anteroseptal wall and anterior segment. There is mild hypokinesis of the left ventricular, mid-apical inferoseptal wall.  2. Right ventricular systolic function is low normal. The right ventricular size is normal.  3. The mitral valve is normal in structure. Trivial mitral valve regurgitation. No evidence of mitral stenosis.  4. The aortic valve was not well visualized. Aortic valve regurgitation is not visualized. No aortic stenosis is present.  5. The inferior vena cava is normal in size with <50% respiratory variability, suggesting right atrial pressure of 8 mmHg. Comparison(s): Changes from prior study are noted. FINDINGS  Left Ventricle: Left ventricular ejection fraction, by estimation, is 45 to 50%. The left ventricle has mildly decreased function. The left ventricle demonstrates regional wall motion abnormalities. Mild hypokinesis of the left ventricular, basal-mid anteroseptal wall and anterior segment. Mild hypokinesis of the left ventricular, mid-apical inferoseptal wall. Definity contrast agent was given IV to delineate the left  ventricular endocardial borders. The left ventricular internal cavity size was normal in size. There is no left ventricular hypertrophy. Left ventricular diastolic parameters are indeterminate. Right Ventricle: The right ventricular size is normal. Right vetricular wall thickness was not assessed. Right ventricular systolic function is low normal. Left Atrium: Left atrial size was normal in size. Right Atrium: Right atrial size was normal in size. Pericardium: There is no evidence of pericardial effusion. Mitral Valve: The mitral valve is normal in structure. Trivial mitral valve regurgitation. No evidence of mitral valve stenosis. Tricuspid Valve: The tricuspid valve is normal in structure. Tricuspid valve regurgitation is trivial. No evidence of tricuspid stenosis. Aortic Valve: The aortic valve was not well visualized. Aortic valve regurgitation is not visualized. No aortic stenosis is present. Pulmonic Valve: The pulmonic valve was not well visualized. Pulmonic valve regurgitation is not visualized. No evidence of pulmonic stenosis. Aorta: The aortic root, ascending aorta and aortic arch are all structurally normal, with no evidence of dilitation or obstruction. Venous: The inferior vena cava is normal in size with less than 50% respiratory variability, suggesting right atrial pressure of 8 mmHg. IAS/Shunts: The atrial septum is grossly normal.  LEFT VENTRICLE PLAX 2D LVIDd:         5.50 cm  Diastology LVIDs:         4.20 cm  LV e' lateral:   9.57 cm/s LV PW:         1.00 cm  LV E/e' lateral: 7.4 LV IVS:        0.90 cm  LV e' medial:    6.09 cm/s LVOT diam:     2.60 cm  LV E/e' medial:  11.6 LV SV:         89 LV SV Index:   32 LVOT Area:     5.31 cm  RIGHT VENTRICLE RV S prime:     8.92 cm/s TAPSE (M-mode): 1.6 cm LEFT ATRIUM             Index       RIGHT ATRIUM           Index LA diam:        3.90 cm 1.42 cm/m  RA Area:     24.20 cm LA Vol (A2C):   86.7 ml 31.48 ml/m RA Volume:   79.90 ml  29.01 ml/m LA  Vol (A4C):   61.2 ml 22.22 ml/m LA Biplane Vol: 76.0 ml 27.59 ml/m  AORTIC VALVE LVOT Vmax:   87.50 cm/s LVOT Vmean:  63.900 cm/s LVOT VTI:    0.167 m  AORTA Ao Root diam: 3.20 cm MITRAL VALVE MV Area (PHT): 3.53 cm    SHUNTS MV Decel Time: 215 msec    Systemic VTI:  0.17 m MV E velocity: 70.70 cm/s  Systemic Diam: 2.60 cm MV A velocity: 76.30 cm/s MV E/A ratio:  0.93 Jodelle Red MD Electronically signed by Jodelle Red MD Signature Date/Time: 11/15/2019/7:58:38 PM    Final    Disposition   Pt is being discharged home today in good condition.  Follow-up Plans & Appointments    Follow-up Information    Azalee Course, [place] Follow up on 11/28/2019.   Specialties: Cardiology, Radiology Why: at 8:15am for your follow up appt.  Contact information: [address] [place] [address] [phone]          Discharge Instructions    Amb Referral to Cardiac Rehabilitation   Complete by: As directed    Diagnosis:  NSTEMI Coronary Stents     After initial evaluation and assessments completed: Virtual Based Care may be provided alone or in conjunction with Phase 2 Cardiac Rehab based on patient barriers.: Yes   Call MD for:  redness, tenderness, or signs of infection (pain, swelling, redness, odor or green/yellow discharge around incision site)   Complete by: As directed    Diet - low sodium heart healthy   Complete by: As directed    Discharge instructions   Complete by: As directed    Radial Site Care Refer to this sheet in the next few weeks. These instructions provide you with information on caring for yourself after your procedure. Your caregiver may also give you more specific instructions. Your treatment has been planned according to current medical practices, but problems sometimes occur. Call your caregiver if you have any problems or questions after your procedure. HOME CARE INSTRUCTIONS You may shower the day after the procedure.Remove the bandage  (dressing) and gently wash the site with plain soap and water.Gently pat the site dry.  Do not apply powder or lotion to the site.  Do not submerge the affected site in water for 3 to 5 days.  Inspect the site at least twice daily.  Do not flex or bend the affected arm for 24 hours.  No lifting over 5 pounds (2.3 kg) for 5 days after your procedure.  Do not drive home if you are discharged the same day of the procedure. Have someone else drive you.  You may drive 24 hours after the procedure unless otherwise instructed by your caregiver.  What to expect: Any bruising will usually fade within 1 to 2 weeks.  Blood that collects in the tissue (hematoma) may be painful to the touch. It should usually decrease in size and tenderness within 1 to 2 weeks.  SEEK IMMEDIATE MEDICAL CARE IF: You have unusual pain at the radial site.  You have redness, warmth, swelling, or pain at the radial site.  You have drainage (other than a small amount of blood on the dressing).  You have chills.  You have a fever or persistent symptoms for more than 72 hours.  You have a fever and your symptoms suddenly get worse.  Your arm becomes pale, cool, tingly, or numb.  You have heavy bleeding from the site. Hold pressure on the site.   PLEASE DO NOT MISS ANY DOSES OF YOUR PLAVIX!!!!! Also keep a log of you blood pressures and bring back to your follow up appt. Please call the office with any questions.   Patients taking blood thinners should generally stay away from medicines like ibuprofen, Advil, Motrin, naproxen, and Aleve due to risk of stomach bleeding. You may take Tylenol as directed or talk to your primary doctor about alternatives.   Increase activity slowly   Complete by: As directed        Discharge Medications     Medication List    STOP taking these medications   dicyclomine 20 MG tablet Commonly known as: BENTYL   esomeprazole 40 MG capsule Commonly known as: NEXIUM    oxyCODONE-acetaminophen 5-325 MG tablet Commonly known as: Percocet   oxyCODONE-acetaminophen 7.5-325 MG tablet Commonly known as: Percocet   promethazine 25 MG tablet Commonly known as: PHENERGAN     TAKE these medications   alprazolam 2 MG tablet Commonly known as: XANAX Take 2 mg by mouth 2 (two) times daily.   amiodarone 200 MG tablet Commonly known as: PACERONE TAKE 1 TABLET ONCE DAILY.   aspirin EC 81 MG tablet Take 1 tablet (81 mg total) by mouth daily.   atorvastatin 80 MG tablet Commonly known as: LIPITOR Take 1 tablet (80 mg total) by mouth daily at 6 PM. What changed:   medication strength  how much to take   bisoprolol 5 MG tablet Commonly known as: ZEBETA Take 1 tablet (5 mg total) by mouth daily. Start taking on: November 18, 2019   budesonide-formoterol 160-4.5 MCG/ACT inhaler Commonly known as: SYMBICORT Inhale 2 puffs into the lungs daily as needed (shortness of breath).   cetirizine 10 MG tablet Commonly known as: ZYRTEC Take 10 mg by mouth daily.   clopidogrel 75 MG tablet Commonly known as: PLAVIX Take 1 tablet (75 mg total) by mouth daily with breakfast. Start taking on: November 18, 2019   diltiazem 240 MG 24 hr capsule Commonly known as: CARDIZEM CD Take 1 capsule (240 mg total) by mouth daily. Start taking on: November 18, 2019 What changed:   medication strength  how much to take   ELIQUIS PO Take 5 mg by mouth daily.   insulin lispro 100 UNIT/ML injection Commonly known as: HUMALOG Inject 8 Units into the skin See admin instructions. Per sliding scale.   isosorbide mononitrate 30 MG 24 hr tablet Commonly known as: IMDUR Take 1 tablet (30 mg total) by mouth daily. Start taking on: November 18, 2019   levalbuterol 45 MCG/ACT inhaler Commonly known as: XOPENEX HFA Inhale 1-2 puffs into the lungs every 6 (six) hours as needed for wheezing or shortness of breath.   metFORMIN 1000 MG tablet Commonly known as: GLUCOPHAGE Take 1 tablet  by mouth 2 (two)  times daily.   montelukast 10 MG tablet Commonly known as: SINGULAIR Take 10 mg by mouth every morning.   multivitamin-iron-minerals-folic acid chewable tablet Chew 1 tablet by mouth daily.   nicotine 14 mg/24hr patch Commonly known as: NICODERM CQ - dosed in mg/24 hours Place 1 patch (14 mg total) onto the skin daily.   nitroGLYCERIN 0.4 MG SL tablet Commonly known as: NITROSTAT Place 1 tablet (0.4 mg total) under the tongue every 5 (five) minutes x 3 doses as needed for chest pain.   pantoprazole 40 MG tablet Commonly known as: PROTONIX Take 40 mg by mouth daily.   sucralfate 1 g tablet Commonly known as: Carafate Take 1 tablet (1 g total) by mouth 4 (four) times daily -  with meals and at bedtime. What changed: when to take this       Yes                               AHA/ACC Clinical Performance & Quality Measures: 1. Aspirin prescribed? - Yes 2. ADP Receptor Inhibitor (Plavix/Clopidogrel, Brilinta/Ticagrelor or Effient/Prasugrel) prescribed (includes medically managed patients)? - Yes 3. Beta Blocker prescribed? - Yes 4. High Intensity Statin (Lipitor 40-80mg  or Crestor 20-40mg ) prescribed? - Yes 5. EF assessed during THIS hospitalization? - Yes 6. For EF <40%, was ACEI/ARB prescribed? - Not Applicable (EF >/= 02%) 7. For EF <40%, Aldosterone Antagonist (Spironolactone or Eplerenone) prescribed? - Not Applicable (EF >/= 58%) 8. Cardiac Rehab Phase II ordered (Included Medically managed Patients)? - Yes   Outstanding Labs/Studies   FLP/LFTs in 8 weeks  Duration of Discharge Encounter   Greater than 30 minutes including physician time.  Signed, Dean Beg NP-C 11/17/2019, 1:27 PM

## 2019-11-17 NOTE — Progress Notes (Signed)
CARDIAC REHAB PHASE I   PRE:  Rate/Rhythm: 70 SR      Up in room   MODE:  Ambulation: 470 ft   POST:  Rate/Rhythm: 77 SR  BP:  Supine:   Sitting: 120/72  Standing:    SaO2: 98%RA [id number] Pt feeling much better today. No CP. Pt walked 470 ft on RA with hand held asst. Pt stated he needs left hip replacement but that he does a lot of walking. Encouraged him to walk for ex but to start slowly and to not fall as he is on eliquis and plavix. Encouraged him to take his time with getting up and down and not get in a hurry. No questions re ed done yesterday.   Luetta Nutting, RN BSN  11/17/2019 9:11 AM

## 2019-11-28 ENCOUNTER — Encounter: Payer: Self-pay | Admitting: Physician Assistant

## 2019-11-28 ENCOUNTER — Ambulatory Visit: Payer: BC Managed Care – PPO | Admitting: Physician Assistant

## 2019-11-28 ENCOUNTER — Other Ambulatory Visit: Payer: Self-pay

## 2019-11-28 VITALS — BP 138/78 | HR 67 | Temp 97.0°F | Ht 79.0 in | Wt 320.0 lb

## 2019-11-28 DIAGNOSIS — E785 Hyperlipidemia, unspecified: Secondary | ICD-10-CM

## 2019-11-28 DIAGNOSIS — Z720 Tobacco use: Secondary | ICD-10-CM

## 2019-11-28 DIAGNOSIS — I25119 Atherosclerotic heart disease of native coronary artery with unspecified angina pectoris: Secondary | ICD-10-CM | POA: Diagnosis not present

## 2019-11-28 DIAGNOSIS — E119 Type 2 diabetes mellitus without complications: Secondary | ICD-10-CM | POA: Diagnosis not present

## 2019-11-28 DIAGNOSIS — I456 Pre-excitation syndrome: Secondary | ICD-10-CM

## 2019-11-28 DIAGNOSIS — I480 Paroxysmal atrial fibrillation: Secondary | ICD-10-CM

## 2019-11-28 MED ORDER — ISOSORBIDE MONONITRATE ER 60 MG PO TB24: 60.0000 mg | ORAL_TABLET | Freq: Every day | ORAL | 3 refills | Status: DC

## 2019-11-28 NOTE — Patient Instructions (Signed)
Medication Instructions:   INCREASE Imdur to 60 mg daily   STOP taking Aspirin after 12/16/2019  *If you need a refill on your cardiac medications before your next appointment, please call your pharmacy*   Lab Work: Your physician recommends that you return for lab work drawn the morning of your follow up appointment with Dr. Ladona Ridgel:   Fasting Lipid Panel-DO NOT EAT OR DRINK PAST MIDNIGHT. Okay to have water  Hepatic (Liver) Function Test  If you have labs (blood work) drawn today and your tests are completely normal, you will receive your results only by: Marland Kitchen MyChart Message (if you have MyChart) OR . A paper copy in the mail If you have any lab test that is abnormal or we need to change your treatment, we will call you to review the results.  Testing/Procedures: NONE ordered at this time of appointment   Follow-Up: At [facility], you and your health needs are our priority.  As part of our continuing mission to provide you with exceptional heart care, we have created designated Provider Care Teams.  These Care Teams include your primary Cardiologist (physician) and Advanced Practice Providers (APPs -  Physician Assistants and Nurse Practitioners) who all work together to provide you with the care you need, when you need it.  Your next appointment:   2-3 month(s)  The format for your next appointment:   In Person  Provider:   Lewayne Bunting, MD  Other Instructions

## 2019-11-28 NOTE — Progress Notes (Signed)
Cardiology Office Note:    Date:  11/30/2019   ID:  Dean [name], DOB Jun 20, 1971, MRN [id number]  PCP:  Veverly Fells, MD  Cardiologist:  Lewayne Bunting, MD  Electrophysiologist:  None   Referring MD: Mickle Plumb, NP   Chief Complaint  Patient presents with  . Follow-up    seen for Dr. Ladona Ridgel    History of Present Illness:    Dean [name] is a 49 y.o. male with a hx of tachypalpitations, WPW syndrome, PAF, DM 2, pancreatitis and recently diagnosed CAD.  He has underwent multiple catheter ablation in the past however has persistent WPW and has developed worsening A. fib.  Since then, he has been placed on amiodarone therapy.  Previous cardiac catheterization at [facility] in 2011 showed moderate nonobstructive CAD with EF of 50%.  More recently, patient presented to the hospital on 11/14/2019 with chest pain started 4 days prior to arrival.  High-sensitivity troponin on arrival was 114.  Cardiac catheterization performed on 11/15/2019 showed 40% proximal RCA lesion, 99% distal RCA lesion treated with DES, 80% proximal LAD lesion treated with DES, EF 50 to 55%.  Postprocedure, he was placed on aspirin and Plavix in anticipation of stopping the aspirin after a month since the patient is also on Eliquis.  Echocardiogram obtained on 11/15/2019 showed EF 45 to 50%, mild hypokinesis of the left ventricular, basal mid anteroseptal wall and anterior segment.  Due to ongoing pain post-cath, he was continued on IV heparin until discharge and was placed on Imdur.  He did have a few episodes of atrial flutter noted on telemetry during hospitalization, however largely remained in sinus rhythm on amiodarone and bisoprolol.  Patient presents today for cardiology office visit.  He denies any shortness of breath, lower extremity edema, orthopnea or PND.  He has cut back smoking down to 2 cigarettes from the previous 2 packs/day.  He has been smoking since age 80.  I encouraged him to continue to try to stop  smoking altogether.  He has occasional substernal chest discomfort however this is quite mild when compared to prior to the cardiac catheterization.  I recommended increasing his Imdur to 60 mg daily.  I also instructed the patient to stop aspirin after Dec 16, 2019.  After 5/1, the only 2 blood thinners he should be on would be Eliquis and Plavix.  Overall, I think he is stable from cardiology perspective and can follow-up with Dr. Ladona Ridgel in 2 to 3 months, at which time he should have a fasting lipid panel and LFT.  Past Medical History:  Diagnosis Date  . Anxiety   . Asthma   . Atrial arrhythmia    multiples with 5-6 ablations  . Atrial fibrillation with rapid ventricular response (HCC)    With rates up to 200 as well as short PR tachycardia, cycle length of 240 ms.  . Chest tightness 05/08/14  . Diabetes mellitus, type II (HCC)   . GERD (gastroesophageal reflux disease)   . Morbid obesity (HCC)   . NSTEMI (non-ST elevated myocardial infarction) (HCC)   . Palpitations 05/08/14  . Pancreatitis   . Pneumonia ~ 2010  . Snores    Wil occasional apnea. Had a remote sleep study but does not remember results.  . SOB (shortness of breath) 05/08/14  . SVT (supraventricular tachycardia) (HCC)   . WPW (Wolff-Parkinson-White syndrome)     Past Surgical History:  Procedure Laterality Date  . ATRIAL FIBRILLATION ABLATION     "&  WPW"  . CARDIAC CATHETERIZATION     "6 or 7" (05/18/2016)  . CORONARY STENT INTERVENTION N/A 11/15/2019   Procedure: CORONARY STENT INTERVENTION;  Surgeon: Kathleene HazelMcAlhany, Christopher D, MD;  Location: [facility] INVASIVE [facility];  Service: Cardiovascular;  Laterality: N/A;  . LEFT HEART CATH AND CORONARY ANGIOGRAPHY N/A 11/15/2019   Procedure: LEFT HEART CATH AND CORONARY ANGIOGRAPHY;  Surgeon: Kathleene HazelMcAlhany, Christopher D, MD;  Location: [facility] INVASIVE CV LAB;  Service: Cardiovascular;  Laterality: N/A;  . WRIST FRACTURE SURGERY Right 1990s    Current Medications: Current Meds  Medication Sig   . alprazolam (XANAX) 2 MG tablet Take 2 mg by mouth 2 (two) times daily.   Marland Kitchen. amiodarone (PACERONE) 200 MG tablet TAKE 1 TABLET ONCE DAILY. (Patient taking differently: Take 200 mg by mouth daily. )  . Apixaban (ELIQUIS PO) Take 5 mg by mouth daily.  Marland Kitchen. aspirin EC 81 MG tablet Take 1 tablet (81 mg total) by mouth daily.  Marland Kitchen. atorvastatin (LIPITOR) 80 MG tablet Take 1 tablet (80 mg total) by mouth daily at 6 PM.  . bisoprolol (ZEBETA) 5 MG tablet Take 1 tablet (5 mg total) by mouth daily.  . budesonide-formoterol (SYMBICORT) 160-4.5 MCG/ACT inhaler Inhale 2 puffs into the lungs daily as needed (shortness of breath).   . cetirizine (ZYRTEC) 10 MG tablet Take 10 mg by mouth daily.  . clopidogrel (PLAVIX) 75 MG tablet Take 1 tablet (75 mg total) by mouth daily with breakfast.  . diltiazem (CARDIZEM CD) 240 MG 24 hr capsule Take 1 capsule (240 mg total) by mouth daily.  . insulin lispro (HUMALOG) 100 UNIT/ML injection Inject 8 Units into the skin See admin instructions. Per sliding scale.  . isosorbide mononitrate (IMDUR) 60 MG 24 hr tablet Take 1 tablet (60 mg total) by mouth daily.  Marland Kitchen. levalbuterol (XOPENEX HFA) 45 MCG/ACT inhaler Inhale 1-2 puffs into the lungs every 6 (six) hours as needed for wheezing or shortness of breath.  . metFORMIN (GLUCOPHAGE) 1000 MG tablet Take 1 tablet by mouth 2 (two) times daily.  . montelukast (SINGULAIR) 10 MG tablet Take 10 mg by mouth every morning.   . multivitamin-iron-minerals-folic acid (CENTRUM) chewable tablet Chew 1 tablet by mouth daily.  . nicotine (NICODERM CQ - DOSED IN MG/24 HOURS) 14 mg/24hr patch Place 1 patch (14 mg total) onto the skin daily.  . nitroGLYCERIN (NITROSTAT) 0.4 MG SL tablet Place 1 tablet (0.4 mg total) under the tongue every 5 (five) minutes x 3 doses as needed for chest pain.  . pantoprazole (PROTONIX) 40 MG tablet Take 40 mg by mouth daily.  . sucralfate (CARAFATE) 1 g tablet Take 1 tablet (1 g total) by mouth 4 (four) times daily -   with meals and at bedtime. (Patient taking differently: Take 1 g by mouth 2 (two) times daily. )  . [DISCONTINUED] isosorbide mononitrate (IMDUR) 30 MG 24 hr tablet Take 1 tablet (30 mg total) by mouth daily.     Allergies:   Prednisone   Social History   Socioeconomic History  . Marital status: Single    Spouse name: Not on file  . Number of children: Not on file  . Years of education: Not on file  . Highest education level: Not on file  Occupational History  . Not on file  Tobacco Use  . Smoking status: Current Every Day Smoker    Packs/day: 1.00    Years: 30.00    Pack years: 30.00  . Smokeless tobacco: Never Used  Substance  and Sexual Activity  . Alcohol use: No    Comment: none x 2 weeks  . Drug use: No  . Sexual activity: Yes  Other Topics Concern  . Not on file  Social History Narrative  . Not on file   Social Determinants of Health   Financial Resource Strain:   . Difficulty of Paying Living Expenses:   Food Insecurity:   . Worried About Charity fundraiser in the Last Year:   . Arboriculturist in the Last Year:   Transportation Needs:   . Film/video editor (Medical):   Marland Kitchen Lack of Transportation (Non-Medical):   Physical Activity:   . Days of Exercise per Week:   . Minutes of Exercise per Session:   Stress:   . Feeling of Stress :   Social Connections:   . Frequency of Communication with Friends and Family:   . Frequency of Social Gatherings with Friends and Family:   . Attends Religious Services:   . Active Member of Clubs or Organizations:   . Attends Archivist Meetings:   Marland Kitchen Marital Status:      Family History: The patient's family history includes Clotting disorder in his father; Diabetes in his father; Heart attack (age of onset: 46) in his father; Hypertension in his father.  ROS:   Please see the history of present illness.     All other systems reviewed and are negative.  EKGs/Labs/Other Studies Reviewed:    The following  studies were reviewed today:  Echo 11/15/2019 1. Left ventricular ejection fraction, by estimation, is 45 to 50%. The  left ventricle has mildly decreased function. The left ventricle  demonstrates regional wall motion abnormalities (see scoring  diagram/findings for description). Left ventricular  diastolic parameters are indeterminate. There is mild hypokinesis of the  left ventricular, basal-mid anteroseptal wall and anterior segment. There  is mild hypokinesis of the left ventricular, mid-apical inferoseptal wall.  2. Right ventricular systolic function is low normal. The right  ventricular size is normal.  3. The mitral valve is normal in structure. Trivial mitral valve  regurgitation. No evidence of mitral stenosis.  4. The aortic valve was not well visualized. Aortic valve regurgitation  is not visualized. No aortic stenosis is present.  5. The inferior vena cava is normal in size with <50% respiratory  variability, suggesting right atrial pressure of 8 mmHg.   Comparison(s): Changes from prior study are noted.    Cath 11/15/2019  Prox RCA lesion is 40% stenosed.  Dist RCA lesion is 99% stenosed.  Prox LAD to Mid LAD lesion is 80% stenosed.  A drug-eluting stent was successfully placed using a SYNERGY XD 3.50X16.  Post intervention, there is a 0% residual stenosis.  A drug-eluting stent was successfully placed using a SYNERGY XD 4.0X20.  Post intervention, there is a 0% residual stenosis.  The left ventricular ejection fraction is 50-55% by visual estimate.  The left ventricular systolic function is normal.  LV end diastolic pressure is normal.  There is no mitral valve regurgitation.   1. Severe stenosis distal RCA. Successful PTCA/DES x 1 distal RCA 2. Severe stenosis proximal LAD. Successful PTCA/DES x 1 proximal LAD 3. Low normal LV systolic function.   Recommendations: DAPT with ASA and Plavix for one month. Since he is also no Eliquis, can stop ASA  after one month and continue Plavix along with Eliquis. Resume Eliquis tomorrow if no bleeding from radial artery cath site.    EKG:  EKG is ordered today.  The ekg ordered today demonstrates normal sinus rhythm with poor R wave progression in the anterior leads  Recent Labs: 10/07/2019: ALT 23 11/15/2019: Magnesium 1.5; TSH 1.907 11/17/2019: BUN 9; Creatinine, Ser 0.84; Hemoglobin 15.3; Platelets 288; Potassium 3.5; Sodium 137  Recent Lipid Panel    Component Value Date/Time   CHOL 124 11/15/2019 0354   TRIG 128 11/15/2019 0354   HDL 33 (L) 11/15/2019 0354   CHOLHDL 3.8 11/15/2019 0354   VLDL 26 11/15/2019 0354   LDLCALC 65 11/15/2019 0354    Physical Exam:    VS:  BP 138/78   Pulse 67   Temp (!) 97 F (36.1 C)   Ht 6\' 7"  (2.007 m)   Wt (!) 320 lb (145.2 kg)   SpO2 94%   BMI 36.05 kg/m     Wt Readings from Last 3 Encounters:  11/28/19 (!) 320 lb (145.2 kg)  11/17/19 (!) 312 lb 4.8 oz (141.7 kg)  10/07/19 (!) 348 lb 5.2 oz (158 kg)     GEN:  Well nourished, well developed in no acute distress HEENT: Normal NECK: No JVD; No carotid bruits LYMPHATICS: No lymphadenopathy CARDIAC: RRR, no murmurs, rubs, gallops RESPIRATORY:  Clear to auscultation without rales, wheezing or rhonchi  ABDOMEN: Soft, non-tender, non-distended MUSCULOSKELETAL:  No edema; No deformity  SKIN: Warm and dry NEUROLOGIC:  Alert and oriented x 3 PSYCHIATRIC:  Normal affect   ASSESSMENT:    1. Coronary artery disease involving native coronary artery of native heart with angina pectoris (HCC)   2. Hyperlipidemia, unspecified hyperlipidemia type   3. PAF (paroxysmal atrial fibrillation) (HCC)   4. Controlled type 2 diabetes mellitus without complication, without long-term current use of insulin (HCC)   5. WPW (Wolff-Parkinson-White syndrome)   6. Tobacco abuse    PLAN:    In order of problems listed above:  1. CAD: Recently underwent PCI x2.  He was discharged on triple therapy including  aspirin, Plavix and Eliquis.  The current plan is to discontinue aspirin after May 1st, after which time he will be on Plavix and Eliquis only.  He still has intermittent chest pain since discharge, I will increase Imdur to 60 mg daily  2. Hyperlipidemia: On Lipitor 80 mg daily.  Fasting lipid panel and LFT in 2 months  3. PAF: On amiodarone and Eliquis.  4. DM2: Managed by primary care provider  5. WPW: Followed by Dr. 01-16-1979  6. Tobacco abuse: He has cut back smoking significantly, however I encouraged him to continue to try to quit altogether.   Medication Adjustments/Labs and Tests Ordered: Current medicines are reviewed at length with the patient today.  Concerns regarding medicines are outlined above.  Orders Placed This Encounter  Procedures  . Hepatic function panel  . Lipid panel  . EKG 12-Lead   Meds ordered this encounter  Medications  . isosorbide mononitrate (IMDUR) 60 MG 24 hr tablet    Sig: Take 1 tablet (60 mg total) by mouth daily.    Dispense:  90 tablet    Refill:  3    Patient Instructions  Medication Instructions:   INCREASE Imdur to 60 mg daily   STOP taking Aspirin after 12/16/2019  *If you need a refill on your cardiac medications before your next appointment, please call your pharmacy*   Lab Work: Your physician recommends that you return for lab work drawn the morning of your follow up appointment with Dr. 02/15/2020:   Fasting Lipid Panel-DO NOT  EAT OR DRINK PAST MIDNIGHT. Okay to have water  Hepatic (Liver) Function Test  If you have labs (blood work) drawn today and your tests are completely normal, you will receive your results only by: Marland Kitchen MyChart Message (if you have MyChart) OR . A paper copy in the mail If you have any lab test that is abnormal or we need to change your treatment, we will call you to review the results.  Testing/Procedures: NONE ordered at this time of appointment   Follow-Up: At [facility], you and your health  needs are our priority.  As part of our continuing mission to provide you with exceptional heart care, we have created designated Provider Care Teams.  These Care Teams include your primary Cardiologist (physician) and Advanced Practice Providers (APPs -  Physician Assistants and Nurse Practitioners) who all work together to provide you with the care you need, when you need it.  Your next appointment:   2-3 month(s)  The format for your next appointment:   In Person  Provider:   Lewayne Bunting, MD  Other Instructions       Signed, Azalee Course, PA  11/30/2019 10:54 PM    [facility]

## 2019-11-30 ENCOUNTER — Encounter: Payer: Self-pay | Admitting: Physician Assistant

## 2020-01-30 ENCOUNTER — Ambulatory Visit: Payer: BC Managed Care – PPO | Admitting: Internal Medicine

## 2020-01-30 ENCOUNTER — Other Ambulatory Visit: Payer: BC Managed Care – PPO

## 2020-02-24 ENCOUNTER — Emergency Department (HOSPITAL_BASED_OUTPATIENT_CLINIC_OR_DEPARTMENT_OTHER): Payer: BC Managed Care – PPO

## 2020-02-24 ENCOUNTER — Encounter (HOSPITAL_BASED_OUTPATIENT_CLINIC_OR_DEPARTMENT_OTHER): Payer: Self-pay | Admitting: Emergency Medicine

## 2020-02-24 ENCOUNTER — Emergency Department (HOSPITAL_BASED_OUTPATIENT_CLINIC_OR_DEPARTMENT_OTHER)
Admission: EM | Admit: 2020-02-24 | Discharge: 2020-02-24 | Disposition: A | Discharge status: Home / Self Care | Payer: BC Managed Care – PPO | Attending: Emergency Medicine | Admitting: Emergency Medicine

## 2020-02-24 ENCOUNTER — Other Ambulatory Visit: Payer: Self-pay

## 2020-02-24 DIAGNOSIS — F17210 Nicotine dependence, cigarettes, uncomplicated: Secondary | ICD-10-CM | POA: Diagnosis not present

## 2020-02-24 DIAGNOSIS — E119 Type 2 diabetes mellitus without complications: Secondary | ICD-10-CM | POA: Diagnosis not present

## 2020-02-24 DIAGNOSIS — R110 Nausea: Secondary | ICD-10-CM | POA: Diagnosis not present

## 2020-02-24 DIAGNOSIS — J45909 Unspecified asthma, uncomplicated: Secondary | ICD-10-CM | POA: Insufficient documentation

## 2020-02-24 DIAGNOSIS — R1012 Left upper quadrant pain: Secondary | ICD-10-CM | POA: Diagnosis not present

## 2020-02-24 DIAGNOSIS — R1013 Epigastric pain: Secondary | ICD-10-CM | POA: Insufficient documentation

## 2020-02-24 DIAGNOSIS — I10 Essential (primary) hypertension: Secondary | ICD-10-CM | POA: Insufficient documentation

## 2020-02-24 DIAGNOSIS — J449 Chronic obstructive pulmonary disease, unspecified: Secondary | ICD-10-CM | POA: Insufficient documentation

## 2020-02-24 DIAGNOSIS — K859 Acute pancreatitis without necrosis or infection, unspecified: Secondary | ICD-10-CM

## 2020-02-24 DIAGNOSIS — I2510 Atherosclerotic heart disease of native coronary artery without angina pectoris: Secondary | ICD-10-CM | POA: Diagnosis not present

## 2020-02-24 LAB — CBC WITH DIFFERENTIAL/PLATELET
Abs Immature Granulocytes: 0.04 10*3/uL (ref 0.00–0.07)
Basophils Absolute: 0.1 10*3/uL (ref 0.0–0.1)
Basophils Relative: 1 %
Eosinophils Absolute: 0.2 10*3/uL (ref 0.0–0.5)
Eosinophils Relative: 2 %
HCT: 46.4 % (ref 39.0–52.0)
Hemoglobin: 16.2 g/dL (ref 13.0–17.0)
Immature Granulocytes: 0 %
Lymphocytes Relative: 18 %
Lymphs Abs: 2 10*3/uL (ref 0.7–4.0)
MCH: 30.6 pg (ref 26.0–34.0)
MCHC: 34.9 g/dL (ref 30.0–36.0)
MCV: 87.5 fL (ref 80.0–100.0)
Monocytes Absolute: 0.7 10*3/uL (ref 0.1–1.0)
Monocytes Relative: 6 %
Neutro Abs: 7.8 10*3/uL — ABNORMAL HIGH (ref 1.7–7.7)
Neutrophils Relative %: 73 %
Platelets: 267 10*3/uL (ref 150–400)
RBC: 5.3 MIL/uL (ref 4.22–5.81)
RDW: 12.9 % (ref 11.5–15.5)
WBC: 10.7 10*3/uL — ABNORMAL HIGH (ref 4.0–10.5)
nRBC: 0 % (ref 0.0–0.2)

## 2020-02-24 LAB — TROPONIN I (HIGH SENSITIVITY): Troponin I (High Sensitivity): 7 ng/L (ref <18)

## 2020-02-24 LAB — COMPREHENSIVE METABOLIC PANEL
ALT: 26 U/L (ref 0–44)
AST: 17 U/L (ref 15–41)
Albumin: 4.1 g/dL (ref 3.5–5.0)
Alkaline Phosphatase: 126 U/L (ref 38–126)
Anion gap: 12 (ref 5–15)
BUN: 12 mg/dL (ref 6–20)
CO2: 25 mmol/L (ref 22–32)
Calcium: 8.6 mg/dL — ABNORMAL LOW (ref 8.9–10.3)
Chloride: 97 mmol/L — ABNORMAL LOW (ref 98–111)
Creatinine, Ser: 0.68 mg/dL (ref 0.61–1.24)
GFR calc Af Amer: >60 mL/min (ref >60)
GFR calc non Af Amer: >60 mL/min (ref >60)
Glucose, Bld: 311 mg/dL — ABNORMAL HIGH (ref 70–99)
Potassium: 4.2 mmol/L (ref 3.5–5.1)
Sodium: 134 mmol/L — ABNORMAL LOW (ref 135–145)
Total Bilirubin: 0.6 mg/dL (ref 0.3–1.2)
Total Protein: 7.4 g/dL (ref 6.5–8.1)

## 2020-02-24 LAB — URINALYSIS, ROUTINE W REFLEX MICROSCOPIC
Bilirubin Urine: NEGATIVE
Glucose, UA: >=500 mg/dL — AB
Hgb urine dipstick: NEGATIVE
Ketones, ur: NEGATIVE mg/dL
Leukocytes,Ua: NEGATIVE
Nitrite: NEGATIVE
Protein, ur: NEGATIVE mg/dL
Specific Gravity, Urine: 1.02 (ref 1.005–1.030)
pH: 6 (ref 5.0–8.0)

## 2020-02-24 LAB — LIPASE, BLOOD: Lipase: 47 U/L (ref 11–51)

## 2020-02-24 LAB — URINALYSIS, MICROSCOPIC (REFLEX)

## 2020-02-24 MED ORDER — LIDOCAINE VISCOUS HCL 2 % MT SOLN
15.0000 mL | Freq: Once | OROMUCOSAL | Status: AC
  Administered 2020-02-24: 15 mL via ORAL
  Filled 2020-02-24: qty 15

## 2020-02-24 MED ORDER — IOHEXOL 300 MG/ML  SOLN
100.0000 mL | Freq: Once | INTRAMUSCULAR | Status: AC | PRN
  Administered 2020-02-24: 100 mL via INTRAVENOUS

## 2020-02-24 MED ORDER — HYDROMORPHONE HCL 1 MG/ML IJ SOLN
1.0000 mg | Freq: Once | INTRAMUSCULAR | Status: AC
  Administered 2020-02-24: 1 mg via INTRAVENOUS
  Filled 2020-02-24: qty 1

## 2020-02-24 MED ORDER — FAMOTIDINE IN NACL 20-0.9 MG/50ML-% IV SOLN
20.0000 mg | Freq: Once | INTRAVENOUS | Status: AC
  Administered 2020-02-24: 20 mg via INTRAVENOUS
  Filled 2020-02-24: qty 50

## 2020-02-24 MED ORDER — PANTOPRAZOLE SODIUM 40 MG IV SOLR
40.0000 mg | Freq: Once | INTRAVENOUS | Status: AC
  Administered 2020-02-24: 40 mg via INTRAVENOUS
  Filled 2020-02-24: qty 40

## 2020-02-24 MED ORDER — SODIUM CHLORIDE 0.9 % IV SOLN
INTRAVENOUS | Status: DC | PRN
  Administered 2020-02-24: 250 mL via INTRAVENOUS

## 2020-02-24 MED ORDER — HYDROMORPHONE HCL 1 MG/ML IJ SOLN: 1.0000 mg | Freq: Once | INTRAMUSCULAR | Status: DC

## 2020-02-24 MED ORDER — LACTATED RINGERS IV BOLUS
1000.0000 mL | Freq: Once | INTRAVENOUS | Status: AC
  Administered 2020-02-24: 1000 mL via INTRAVENOUS

## 2020-02-24 MED ORDER — OXYCODONE-ACETAMINOPHEN 10-325 MG PO TABS: 1.0000 | ORAL_TABLET | Freq: Four times a day (QID) | ORAL | 0 refills | Status: DC | PRN

## 2020-02-24 MED ORDER — ALUM & MAG HYDROXIDE-SIMETH 200-200-20 MG/5ML PO SUSP
30.0000 mL | Freq: Once | ORAL | Status: AC
  Administered 2020-02-24: 30 mL via ORAL
  Filled 2020-02-24: qty 30

## 2020-02-24 NOTE — ED Notes (Signed)
ED Provider at bedside. 

## 2020-02-24 NOTE — ED Triage Notes (Signed)
LUQ abd pain x 10 days.  Called pmd and was prescribed vicodin.  Pt has recurrent Pancreatitis.  Has not gotten any better.  Called pmd again yesterday and they advised him to come to ED for more complete eval.

## 2020-02-24 NOTE — Discharge Instructions (Addendum)
Take pantoprazole twice a day for the next week then resume previous dosing. Follow-up with gastroenterology as previously recommended.

## 2020-02-24 NOTE — ED Notes (Signed)
Patient transported to Ultrasound 

## 2020-02-24 NOTE — ED Notes (Signed)
Patient transported to CT 

## 2020-03-03 NOTE — ED Provider Notes (Signed)
[facility] EMERGENCY DEPARTMENT Provider Note   CSN: [id number] Arrival date & time: 02/24/20  0831     History Chief Complaint  Patient presents with  . Abdominal Pain    Dean FloodRobert G [name] is a 49 y.o. male.  HPI    49 year old male with abdominal pain.  Upper abdomen.  Onset over a week ago.  He feels like pain is getting worse.  He has a history of recurrent pancreatitis.  He suspects his symptoms may be from this.  Had nausea.  No vomiting.  He states that he quit drinking alcohol.  Denies any separate NSAID usage. Past Medical History:  Diagnosis Date  . Anxiety   . Asthma   . Atrial arrhythmia    multiples with 5-6 ablations  . Atrial fibrillation with rapid ventricular response (HCC)    With rates up to 200 as well as short PR tachycardia, cycle length of 240 ms.  . Chest tightness 05/08/14  . Diabetes mellitus, type II (HCC)   . GERD (gastroesophageal reflux disease)   . Morbid obesity (HCC)   . NSTEMI (non-ST elevated myocardial infarction) (HCC)   . Palpitations 05/08/14  . Pancreatitis   . Pneumonia ~ 2010  . Snores    Wil occasional apnea. Had a remote sleep study but does not remember results.  . SOB (shortness of breath) 05/08/14  . SVT (supraventricular tachycardia) (HCC)   . WPW (Wolff-Parkinson-White syndrome)     Patient Active Problem List   Diagnosis Date Noted  . NSTEMI (non-ST elevated myocardial infarction) (HCC) 11/14/2019  . Stroke-like symptoms 05/18/2016  . Smoker 05/18/2016  . COPD (chronic obstructive pulmonary disease) (HCC) 05/18/2016  . ETOH abuse 05/18/2016  . Pulmonary nodule, right 05/18/2016  . CAD-noted on chest CT 05/18/2016  . Family history of coronary artery disease in father 05/18/2016  . Cerebrovascular accident (CVA) due to thrombosis of right middle cerebral artery (HCC)   . Mixed hyperlipidemia   . Long term current use of anticoagulant 08/16/2015  . Gastric catarrh 08/16/2015  . History of biliary T-tube  placement 08/16/2015  . Personal history of other diseases of the circulatory system 08/16/2015  . Polycythemia, secondary 08/16/2015  . Paroxysmal atrial fibrillation (HCC) 07/18/2015  . Morbid obesity (HCC) 07/18/2015  . Essential hypertension 07/18/2015  . Type 2 diabetes mellitus without complication, without long-term current use of insulin (HCC) 05/10/2014  . History of PSVT (paroxysmal supraventricular tachycardia) 05/09/2014  . WPW (Wolff-Parkinson-White syndrome) 05/09/2014  . Avascular necrosis of bone of hip (HCC) 07/12/2013  . Acid reflux 07/12/2013  . Arthralgia of hip 07/12/2013  . Chest pain of unknown etiology 05/01/2013    Past Surgical History:  Procedure Laterality Date  . ATRIAL FIBRILLATION ABLATION     "& WPW"  . CARDIAC CATHETERIZATION     "6 or 7" (05/18/2016)  . CORONARY STENT INTERVENTION N/A 11/15/2019   Procedure: CORONARY STENT INTERVENTION;  Surgeon: Kathleene HazelMcAlhany, Christopher D, MD;  Location: [facility] INVASIVE CV LAB;  Service: Cardiovascular;  Laterality: N/A;  . LEFT HEART CATH AND CORONARY ANGIOGRAPHY N/A 11/15/2019   Procedure: LEFT HEART CATH AND CORONARY ANGIOGRAPHY;  Surgeon: Kathleene HazelMcAlhany, Christopher D, MD;  Location: [facility] INVASIVE CV LAB;  Service: Cardiovascular;  Laterality: N/A;  . WRIST FRACTURE SURGERY Right 1990s       Family History  Problem Relation Age of Onset  . Clotting disorder Father   . Diabetes Father   . Heart attack Father 7453  . Hypertension Father  Social History   Tobacco Use  . Smoking status: Current Every Day Smoker    Packs/day: 1.00    Years: 30.00    Pack years: 30.00  . Smokeless tobacco: Never Used  Substance Use Topics  . Alcohol use: No    Comment: none x 2 weeks  . Drug use: No    Home Medications Prior to Admission medications   Medication Sig Start Date End Date Taking? Authorizing Provider  alprazolam Prudy Feeler) 2 MG tablet Take 2 mg by mouth 2 (two) times daily.     [provider]  amiodarone  (PACERONE) 200 MG tablet TAKE 1 TABLET ONCE DAILY. Patient taking differently: Take 200 mg by mouth daily.  12/14/16   Marinus Maw, MD  Apixaban (ELIQUIS PO) Take 5 mg by mouth daily.    [provider]  atorvastatin (LIPITOR) 80 MG tablet Take 1 tablet (80 mg total) by mouth daily at 6 PM. 11/17/19   Arty Baumgartner, NP  bisoprolol (ZEBETA) 5 MG tablet Take 1 tablet (5 mg total) by mouth daily. 11/18/19   Arty Baumgartner, NP  budesonide-formoterol [facility]) 160-4.5 MCG/ACT inhaler Inhale 2 puffs into the lungs daily as needed (shortness of breath).     [provider]  cetirizine (ZYRTEC) 10 MG tablet Take 10 mg by mouth daily.    [provider]  diltiazem (CARDIZEM CD) 240 MG 24 hr capsule Take 1 capsule (240 mg total) by mouth daily. 11/18/19   Arty Baumgartner, NP  insulin lispro (HUMALOG) 100 UNIT/ML injection Inject 8 Units into the skin See admin instructions. Per sliding scale.    [provider]  isosorbide mononitrate (IMDUR) 60 MG 24 hr tablet Take 1 tablet (60 mg total) by mouth daily. 11/28/19   Azalee Course, PA  levalbuterol [facility] HFA) 45 MCG/ACT inhaler Inhale 1-2 puffs into the lungs every 6 (six) hours as needed for wheezing or shortness of breath.    [provider]  levalbuterol Pauline Aus) 1.25 MG/3ML nebulizer solution  01/01/20   [provider]  metFORMIN (GLUCOPHAGE) 1000 MG tablet Take 1 tablet by mouth 2 (two) times daily. 08/17/15   [provider]  montelukast (SINGULAIR) 10 MG tablet Take 10 mg by mouth every morning.     [provider]  multivitamin-iron-minerals-folic acid (CENTRUM) chewable tablet Chew 1 tablet by mouth daily.    [provider]  nitroGLYCERIN (NITROSTAT) 0.4 MG SL tablet Place 1 tablet (0.4 mg total) under the tongue every 5 (five) minutes x 3 doses as needed for chest pain. 11/17/19   Arty Baumgartner, NP  oxyCODONE-acetaminophen (PERCOCET) 10-325 MG tablet Take 1  tablet by mouth every 6 (six) hours as needed for pain. 02/24/20   Raeford Razor, MD  pantoprazole (PROTONIX) 40 MG tablet Take 40 mg by mouth daily. 09/26/19   [provider]  sucralfate (CARAFATE) 1 g tablet Take 1 tablet (1 g total) by mouth 4 (four) times daily -  with meals and at bedtime. Patient taking differently: Take 1 g by mouth 2 (two) times daily.  07/09/17   Rolan Bucco, MD    Allergies    Prednisone  Review of Systems   Review of Systems All systems reviewed and negative, other than as noted in HPI.  Physical Exam Updated Vital Signs BP (!) 149/77 (BP Location: Left Arm)   Pulse (!) 56   Temp 97.6 F (36.4 C) (Oral)   Resp 16   Ht 6\' 6"  (1.981  m)   Wt (!) 139.3 kg   SpO2 96%   BMI 35.49 kg/m   Physical Exam Vitals and nursing note reviewed.  Constitutional:      General: He is not in acute distress.    Appearance: He is well-developed.  HENT:     Head: Normocephalic and atraumatic.  Eyes:     General:        Right eye: No discharge.        Left eye: No discharge.     Conjunctiva/sclera: Conjunctivae normal.  Cardiovascular:     Rate and Rhythm: Normal rate and regular rhythm.     Heart sounds: Normal heart sounds. No murmur heard.  No friction rub. No gallop.   Pulmonary:     Effort: Pulmonary effort is normal. No respiratory distress.     Breath sounds: Normal breath sounds.  Abdominal:     General: There is no distension.     Palpations: Abdomen is soft.     Tenderness: There is abdominal tenderness.     Comments: Upper abdominal tenderness without rebound or guarding.  Musculoskeletal:        General: No tenderness.     Cervical back: Neck supple.  Skin:    General: Skin is warm and dry.  Neurological:     Mental Status: He is alert.  Psychiatric:        Behavior: Behavior normal.        Thought Content: Thought content normal.     ED Results / Procedures / Treatments   Labs (all labs ordered are listed, but only abnormal  results are displayed) Labs Reviewed  CBC WITH DIFFERENTIAL/PLATELET - Abnormal; Notable for the following components:      Result Value   WBC 10.7 (*)    Neutro Abs 7.8 (*)    All other components within normal limits  COMPREHENSIVE METABOLIC PANEL - Abnormal; Notable for the following components:   Sodium 134 (*)    Chloride 97 (*)    Glucose, Bld 311 (*)    Calcium 8.6 (*)    All other components within normal limits  URINALYSIS, ROUTINE W REFLEX MICROSCOPIC - Abnormal; Notable for the following components:   Glucose, UA >=500 (*)    All other components within normal limits  URINALYSIS, MICROSCOPIC (REFLEX) - Abnormal; Notable for the following components:   Bacteria, UA RARE (*)    All other components within normal limits  LIPASE, BLOOD  TROPONIN I (HIGH SENSITIVITY)    EKG EKG Interpretation  Date/Time:  Saturday February 24 2020 09:16:56 EDT Ventricular Rate:  107 PR Interval:    QRS Duration: 108 QT Interval:  384 QTC Calculation: 473 R Axis:   -74 Text Interpretation: Atrial fibrillation Premature ventricular complexes Left anterior fascicular block Anteroseptal infarct, old Minimal ST depression, inferior leads Since last tracing Atrial fibrillation has replaced Normal sinus rhythm Confirmed by Susy Frizzle [phone]) on 02/25/2020 9:40:24 AM   Radiology No results found.  Procedures Procedures (including critical care time)  Medications Ordered in ED Medications  lactated ringers bolus 1,000 mL ( Intravenous Stopped 02/24/20 1046)  HYDROmorphone (DILAUDID) injection 1 mg (1 mg Intravenous Given 02/24/20 0920)  famotidine (PEPCID) IVPB 20 mg premix ( Intravenous Stopped 02/24/20 1006)  alum & mag hydroxide-simeth (MAALOX/MYLANTA) 200-200-20 MG/5ML suspension 30 mL (30 mLs Oral Given 02/24/20 0903)    And  lidocaine (XYLOCAINE) 2 % viscous mouth solution 15 mL (15 mLs Oral Given 02/24/20 0903)  iohexol (OMNIPAQUE) 300 MG/ML solution  100 mL (100 mLs Intravenous  Contrast Given 02/24/20 1024)  HYDROmorphone (DILAUDID) injection 1 mg (1 mg Intravenous Given 02/24/20 1053)  pantoprazole (PROTONIX) injection 40 mg (40 mg Intravenous Given 02/24/20 1151)  HYDROmorphone (DILAUDID) injection 1 mg (1 mg Intravenous Given 02/24/20 1349)    ED Course  I have reviewed the triage vital signs and the nursing notes.  Pertinent labs & imaging results that were available during my care of the patient were reviewed by me and considered in my medical decision making (see chart for details).    MDM Rules/Calculators/A&P                          49 year old male with upper abdominal pain.  ED work-up fairly unremarkable.  Seems to be subacute to acute on chronic issue.  Regardless, does not appear to emergent process.  Needs outpatient GI follow-up.  Final Clinical Impression(s) / ED Diagnoses Final diagnoses:  Epigastric pain    Rx / DC Orders ED Discharge Orders         Ordered    oxyCODONE-acetaminophen (PERCOCET) 10-325 MG tablet  Every 6 hours PRN     Discontinue  Reprint     02/24/20 1342           Raeford Razor, MD 03/03/20 1206

## 2020-03-05 ENCOUNTER — Telehealth: Payer: Self-pay | Admitting: Internal Medicine

## 2020-03-05 MED ORDER — APIXABAN 5 MG PO TABS
5.0000 mg | ORAL_TABLET | Freq: Two times a day (BID) | ORAL | 5 refills | Status: DC
Start: 2020-03-05 — End: 2021-02-10

## 2020-03-05 NOTE — Telephone Encounter (Signed)
       [facility] Pre-operative Risk Assessment    HEARTCARE STAFF: - Please ensure there is not already an duplicate clearance open for this procedure. - Under Visit Info/Reason for Call, type in Other and utilize the format Clearance MM/DD/YY or Clearance TBD. Do not use dashes or single digits. - If request is for dental extraction, please clarify the # of teeth to be extracted.  Request for surgical clearance:  1. What type of surgery is being performed? Endoscopic ultrasound   2. When is this surgery scheduled? 03/13/20  3. What type of clearance is required (medical clearance vs. Pharmacy clearance to hold med vs. Both)? Pharmacy  4. Are there any medications that need to be held prior to surgery and how long?Plavix 5 days   5. Practice name and name of physician performing surgery? Myer Haff,  [facility])  6. What is the office phone number? Lattie Haw [phone]   7.   What is the office fax number? [phone]  8.   Anesthesia type (None, local, MAC, general) ? General   Wife of the patient called. The patient is having a scope done and his GI Doctor needs confirmation that the patient can hold his plavix. Wife would like a call as well to know if    Johnna Acosta 03/05/2020, 1:55 PM  _________________________________________________________________   (provider comments below)

## 2020-03-05 NOTE — Telephone Encounter (Signed)
Dr Sanjuana Kava you performed an RCA and LAD PCI with Synergy XD DES in this patient on 11/15/2019 in the setting of a NSTEMI.  A pre op request has been submitted to hold Plavix prior to endoscopy. Can you comment on this ? Please respond to CV DIV PRE OP.   He is also on Eliquis for PAF and I'll copy pharmacy as well.  Thanks  Corine Shelter PA-C 03/05/2020 4:07 PM

## 2020-03-05 NOTE — Telephone Encounter (Signed)
Patient with diagnosis of Atrial Fibrillation on Eliqius for anticoagulation.    Procedure: Endoscopic ultrasound Date of procedure: 03/13/20  CHADS2-VASc score of 5 (HTN, DM2, stroke/TIA, CAD)  CrCl 211 ml/min Platelet count 267K  Per office protocol, patient can hold Eliquis for 1 day prior to procedure due to elevated cardiac risk.   Resume Eliquis as soon as safely possible post procedure.  Patient is currently taking Eliquis 5 mg once daily, please let patient know Eliquis should be taken twice daily.   Sent refill with correct Eliquis dosing to patient's pharmacy  Fabio Neighbors, PharmD PGY2 Ambulatory Care Pharmacy Resident [facility] [address], [place], [place] [address] Phone: [phone]; Fax: [phone]

## 2020-03-06 NOTE — Telephone Encounter (Signed)
° °  Primary Cardiologist: Lewayne Bunting, MD  Chart reviewed as part of pre-operative protocol coverage. Given past medical history and time since last visit, based on ACC/AHA guidelines, SHAMEL GERMOND would be at acceptable risk for the planned procedure without further cardiovascular testing.   Patient with diagnosis of Atrial Fibrillation on Eliqius for anticoagulation.    Procedure: Endoscopic ultrasound Date of procedure: 03/13/20  CHADS2-VASc score of 5 (HTN, DM2, stroke/TIA, CAD)  CrCl 211 ml/min Platelet count 267K  Per office protocol, patient can hold Eliquis for 1 day prior to procedure due to elevated cardiac risk.   Resume Eliquis as soon as safely possible post procedure.  Patient is currently taking Eliquis 5 mg once daily, please let patient know Eliquis should be taken twice daily.   Correct Eliquis dosing has been sent to patient's pharmacy.  His Plavix may be held for 5 days prior to his procedure.  Please resume as soon as hemostasis is achieved.  Please call with questions.  Thomasene Ripple. Lucianna Ostlund NP-C    03/06/2020, 9:47 AM [facility] [address] Office [phone] Fax [phone]

## 2020-03-06 NOTE — Telephone Encounter (Signed)
OK to hold Plavix.   Emmi Wertheim  

## 2020-03-07 NOTE — Telephone Encounter (Signed)
Patient's wife states the patient is taking plavix not eliquis. She is confused that the eliquis was sent to the pharmacy and would like a call back to clarify what the patient can hold prior to his surgery and what he needs to take.

## 2020-03-07 NOTE — Telephone Encounter (Signed)
Patient's wife, Aggie Cosier, is calling because the office of Virgel Paling, [facility]) has not heard anything regarding the clearance for his Endoscopic Ultrasound that is scheduled on 03/13/20. Crystal also wanted to make sure he gets cleared to hold the plavix, not eliquis. Please advise.

## 2020-03-08 NOTE — Telephone Encounter (Signed)
Patient's wife states the patient is taking plavix not eliquis. She states that request new rx sent

## 2020-03-08 NOTE — Telephone Encounter (Signed)
To clarify: Spoke with pt's wife and notified her that pt is cleared and went over medication directions for surgery. She states that requesting party has not received cardiac clearance note. Re faxed to requesting party:John Sweeny, [facility])  Attn:Lisa @ 03/08/2020 12:05 PM Cover Page Message:Cardiac clearance

## 2020-03-08 NOTE — Telephone Encounter (Signed)
Dean [name] from [place] [facility] needs the clearance faxed to [phone].

## 2020-03-08 NOTE — Telephone Encounter (Signed)
Patient's wife is calling to follow up in regards to cardiac clearance for endoscopy scheduled for 03/13/20. She states the patient has not yet heard anything back from our office. Please return call to update patient.

## 2020-03-08 NOTE — Telephone Encounter (Signed)
Called to verify receipt of fax s/w Lisa [place] and she states to [phone]. Faxed to this number manually.

## 2020-03-22 ENCOUNTER — Ambulatory Visit: Payer: BC Managed Care – PPO | Admitting: Internal Medicine

## 2020-03-22 ENCOUNTER — Other Ambulatory Visit: Payer: BC Managed Care – PPO

## 2020-04-02 ENCOUNTER — Telehealth: Payer: Self-pay | Admitting: Physician Assistant

## 2020-04-02 NOTE — Telephone Encounter (Signed)
LVM for patient to return call to get appointment scheduled from staff messages

## 2020-05-21 ENCOUNTER — Other Ambulatory Visit: Payer: Self-pay | Admitting: Cardiology

## 2020-05-22 MED ORDER — DILTIAZEM HCL ER COATED BEADS 240 MG PO CP24: 240.0000 mg | ORAL_CAPSULE | Freq: Every day | ORAL | 1 refills | Status: AC

## 2020-05-22 MED ORDER — ISOSORBIDE MONONITRATE ER 60 MG PO TB24: 60.0000 mg | ORAL_TABLET | Freq: Every day | ORAL | 1 refills | Status: AC

## 2020-12-31 ENCOUNTER — Encounter (HOSPITAL_BASED_OUTPATIENT_CLINIC_OR_DEPARTMENT_OTHER): Payer: Self-pay | Admitting: Emergency Medicine

## 2020-12-31 ENCOUNTER — Other Ambulatory Visit: Payer: Self-pay

## 2020-12-31 ENCOUNTER — Emergency Department (HOSPITAL_BASED_OUTPATIENT_CLINIC_OR_DEPARTMENT_OTHER): Payer: Commercial Managed Care - PPO

## 2020-12-31 ENCOUNTER — Emergency Department (HOSPITAL_BASED_OUTPATIENT_CLINIC_OR_DEPARTMENT_OTHER)
Admission: EM | Admit: 2020-12-31 | Discharge: 2020-12-31 | Disposition: A | Discharge status: Home / Self Care | Payer: Commercial Managed Care - PPO | Attending: Emergency Medicine | Admitting: Emergency Medicine

## 2020-12-31 DIAGNOSIS — K219 Gastro-esophageal reflux disease without esophagitis: Secondary | ICD-10-CM | POA: Diagnosis not present

## 2020-12-31 DIAGNOSIS — U071 COVID-19: Secondary | ICD-10-CM | POA: Diagnosis not present

## 2020-12-31 DIAGNOSIS — Z7901 Long term (current) use of anticoagulants: Secondary | ICD-10-CM | POA: Diagnosis not present

## 2020-12-31 DIAGNOSIS — Z794 Long term (current) use of insulin: Secondary | ICD-10-CM | POA: Insufficient documentation

## 2020-12-31 DIAGNOSIS — J449 Chronic obstructive pulmonary disease, unspecified: Secondary | ICD-10-CM | POA: Diagnosis not present

## 2020-12-31 DIAGNOSIS — Z7984 Long term (current) use of oral hypoglycemic drugs: Secondary | ICD-10-CM | POA: Diagnosis not present

## 2020-12-31 DIAGNOSIS — R1084 Generalized abdominal pain: Secondary | ICD-10-CM | POA: Diagnosis present

## 2020-12-31 DIAGNOSIS — F17200 Nicotine dependence, unspecified, uncomplicated: Secondary | ICD-10-CM | POA: Diagnosis not present

## 2020-12-31 DIAGNOSIS — J45909 Unspecified asthma, uncomplicated: Secondary | ICD-10-CM | POA: Diagnosis not present

## 2020-12-31 DIAGNOSIS — Z79899 Other long term (current) drug therapy: Secondary | ICD-10-CM | POA: Insufficient documentation

## 2020-12-31 DIAGNOSIS — E119 Type 2 diabetes mellitus without complications: Secondary | ICD-10-CM | POA: Insufficient documentation

## 2020-12-31 DIAGNOSIS — Z955 Presence of coronary angioplasty implant and graft: Secondary | ICD-10-CM | POA: Diagnosis not present

## 2020-12-31 LAB — CBC
HCT: 42.8 % (ref 39.0–52.0)
Hemoglobin: 14.5 g/dL (ref 13.0–17.0)
MCH: 30.7 pg (ref 26.0–34.0)
MCHC: 33.9 g/dL (ref 30.0–36.0)
MCV: 90.5 fL (ref 80.0–100.0)
Platelets: 308 10*3/uL (ref 150–400)
RBC: 4.73 MIL/uL (ref 4.22–5.81)
RDW: 12.9 % (ref 11.5–15.5)
WBC: 11.4 10*3/uL — ABNORMAL HIGH (ref 4.0–10.5)
nRBC: 0 % (ref 0.0–0.2)

## 2020-12-31 LAB — CBG MONITORING, ED: Glucose-Capillary: 134 mg/dL — ABNORMAL HIGH (ref 70–99)

## 2020-12-31 LAB — COMPREHENSIVE METABOLIC PANEL
ALT: 23 U/L (ref 0–44)
AST: 21 U/L (ref 15–41)
Albumin: 4 g/dL (ref 3.5–5.0)
Alkaline Phosphatase: 110 U/L (ref 38–126)
Anion gap: 9 (ref 5–15)
BUN: 11 mg/dL (ref 6–20)
CO2: 25 mmol/L (ref 22–32)
Calcium: 9.3 mg/dL (ref 8.9–10.3)
Chloride: 102 mmol/L (ref 98–111)
Creatinine, Ser: 0.75 mg/dL (ref 0.61–1.24)
GFR, Estimated: >60 mL/min (ref >60)
Glucose, Bld: 144 mg/dL — ABNORMAL HIGH (ref 70–99)
Potassium: 4.5 mmol/L (ref 3.5–5.1)
Sodium: 136 mmol/L (ref 135–145)
Total Bilirubin: 0.2 mg/dL — ABNORMAL LOW (ref 0.3–1.2)
Total Protein: 7.5 g/dL (ref 6.5–8.1)

## 2020-12-31 LAB — URINALYSIS, ROUTINE W REFLEX MICROSCOPIC
Bilirubin Urine: NEGATIVE
Glucose, UA: NEGATIVE mg/dL
Hgb urine dipstick: NEGATIVE
Ketones, ur: NEGATIVE mg/dL
Leukocytes,Ua: NEGATIVE
Nitrite: NEGATIVE
Protein, ur: NEGATIVE mg/dL
Specific Gravity, Urine: 1.01 (ref 1.005–1.030)
pH: 7 (ref 5.0–8.0)

## 2020-12-31 LAB — RESP PANEL BY RT-PCR (FLU A&B, COVID) ARPGX2
Influenza A by PCR: NEGATIVE
Influenza B by PCR: NEGATIVE
SARS Coronavirus 2 by RT PCR: POSITIVE — AB

## 2020-12-31 LAB — LIPASE, BLOOD: Lipase: 19 U/L (ref 11–51)

## 2020-12-31 MED ORDER — ONDANSETRON HCL 4 MG/2ML IJ SOLN
4.0000 mg | Freq: Once | INTRAMUSCULAR | Status: AC
  Administered 2020-12-31: 4 mg via INTRAVENOUS
  Filled 2020-12-31: qty 2

## 2020-12-31 MED ORDER — ACETAMINOPHEN 325 MG PO TABS
650.0000 mg | ORAL_TABLET | Freq: Once | ORAL | Status: AC | PRN
  Administered 2020-12-31: 650 mg via ORAL
  Filled 2020-12-31: qty 2

## 2020-12-31 MED ORDER — HYDROMORPHONE HCL 1 MG/ML IJ SOLN
0.5000 mg | INTRAMUSCULAR | Status: DC | PRN
  Administered 2020-12-31 (×2): 0.5 mg via INTRAVENOUS
  Filled 2020-12-31 (×2): qty 1

## 2020-12-31 MED ORDER — HYDROMORPHONE HCL 1 MG/ML IJ SOLN
0.5000 mg | Freq: Once | INTRAMUSCULAR | Status: AC
Start: 2020-12-31 — End: 2020-12-31
  Administered 2020-12-31: 0.5 mg via INTRAVENOUS
  Filled 2020-12-31: qty 1

## 2020-12-31 MED ORDER — SODIUM CHLORIDE 0.9 % IV BOLUS
1000.0000 mL | Freq: Once | INTRAVENOUS | Status: AC
  Administered 2020-12-31: 1000 mL via INTRAVENOUS

## 2020-12-31 NOTE — ED Provider Notes (Signed)
[facility] EMERGENCY DEPARTMENT Provider Note   CSN: [id number] Arrival date & time: 12/31/20  5784     History Chief Complaint  Patient presents with  . Abdominal Pain    Dean [name] is a 50 y.o. male.  50 year old male with history of pancreatitis, diabetes, MI, A. fib (on Eliquis and amiodarone), daily smoker, COPD, presents with complaint of abdominal pain at x2 weeks, located diffuse, more so upper abdomen, described as aching in nature and similar to prior pancreatitis flares.  States pain has been worse since yesterday, febrile today, 1 episode of nonbloody emesis today.  Denies changes in bowel or bladder habits, denies blood in his stools.        Past Medical History:  Diagnosis Date  . Anxiety   . Asthma   . Atrial arrhythmia    multiples with 5-6 ablations  . Atrial fibrillation with rapid ventricular response (HCC)    With rates up to 200 as well as short PR tachycardia, cycle length of 240 ms.  . Chest tightness 05/08/14  . Diabetes mellitus, type II (HCC)   . GERD (gastroesophageal reflux disease)   . Morbid obesity (HCC)   . NSTEMI (non-ST elevated myocardial infarction) (HCC)   . Palpitations 05/08/14  . Pancreatitis   . Pneumonia ~ 2010  . Snores    Wil occasional apnea. Had a remote sleep study but does not remember results.  . SOB (shortness of breath) 05/08/14  . SVT (supraventricular tachycardia) (HCC)   . WPW (Wolff-Parkinson-White syndrome)     Patient Active Problem List   Diagnosis Date Noted  . NSTEMI (non-ST elevated myocardial infarction) (HCC) 11/14/2019  . Stroke-like symptoms 05/18/2016  . Smoker 05/18/2016  . COPD (chronic obstructive pulmonary disease) (HCC) 05/18/2016  . ETOH abuse 05/18/2016  . Pulmonary nodule, right 05/18/2016  . CAD-noted on chest CT 05/18/2016  . Family history of coronary artery disease in father 05/18/2016  . Cerebrovascular accident (CVA) due to thrombosis of right middle cerebral artery (HCC)    . Mixed hyperlipidemia   . Long term current use of anticoagulant 08/16/2015  . Gastric catarrh 08/16/2015  . History of biliary T-tube placement 08/16/2015  . Personal history of other diseases of the circulatory system 08/16/2015  . Polycythemia, secondary 08/16/2015  . Paroxysmal atrial fibrillation (HCC) 07/18/2015  . Morbid obesity (HCC) 07/18/2015  . Essential hypertension 07/18/2015  . Type 2 diabetes mellitus without complication, without long-term current use of insulin (HCC) 05/10/2014  . History of PSVT (paroxysmal supraventricular tachycardia) 05/09/2014  . WPW (Wolff-Parkinson-White syndrome) 05/09/2014  . Avascular necrosis of bone of hip (HCC) 07/12/2013  . Acid reflux 07/12/2013  . Arthralgia of hip 07/12/2013  . Chest pain of unknown etiology 05/01/2013    Past Surgical History:  Procedure Laterality Date  . ATRIAL FIBRILLATION ABLATION     "& WPW"  . CARDIAC CATHETERIZATION     "6 or 7" (05/18/2016)  . CORONARY STENT INTERVENTION N/A 11/15/2019   Procedure: CORONARY STENT INTERVENTION;  Surgeon: Kathleene Hazel, MD;  Location: [facility] INVASIVE CV LAB;  Service: Cardiovascular;  Laterality: N/A;  . LEFT HEART CATH AND CORONARY ANGIOGRAPHY N/A 11/15/2019   Procedure: LEFT HEART CATH AND CORONARY ANGIOGRAPHY;  Surgeon: Kathleene Hazel, MD;  Location: [facility] INVASIVE CV LAB;  Service: Cardiovascular;  Laterality: N/A;  . WRIST FRACTURE SURGERY Right 1990s       Family History  Problem Relation Age of Onset  . Clotting disorder Father   .  Diabetes Father   . Heart attack Father 89  . Hypertension Father     Social History   Tobacco Use  . Smoking status: Current Every Day Smoker    Packs/day: 1.00    Years: 30.00    Pack years: 30.00  . Smokeless tobacco: Never Used  Substance Use Topics  . Alcohol use: No    Comment: none x 2 weeks  . Drug use: No    Home Medications Prior to Admission medications   Medication Sig Start Date End Date  Taking? Authorizing Provider  alprazolam Prudy Feeler) 2 MG tablet Take 2 mg by mouth 2 (two) times daily.     [provider]  amiodarone (PACERONE) 200 MG tablet TAKE 1 TABLET ONCE DAILY. Patient taking differently: Take 200 mg by mouth daily.  12/14/16   Marinus Maw, MD  apixaban (ELIQUIS) 5 MG TABS tablet Take 1 tablet (5 mg total) by mouth 2 (two) times daily. 03/05/20   Marinus Maw, MD  atorvastatin (LIPITOR) 80 MG tablet Take 1 tablet (80 mg total) by mouth daily at 6 PM. 11/17/19   Arty Baumgartner, NP  bisoprolol (ZEBETA) 5 MG tablet TAKE 1 TABLET ONCE DAILY. 05/22/20   Marinus Maw, MD  budesonide-formoterol [facility]) 160-4.5 MCG/ACT inhaler Inhale 2 puffs into the lungs daily as needed (shortness of breath).     [provider]  cetirizine (ZYRTEC) 10 MG tablet Take 10 mg by mouth daily.    [provider]  diltiazem (CARDIZEM CD) 240 MG 24 hr capsule Take 1 capsule (240 mg total) by mouth daily. 05/22/20   Marinus Maw, MD  insulin lispro (HUMALOG) 100 UNIT/ML injection Inject 8 Units into the skin See admin instructions. Per sliding scale.    [provider]  isosorbide mononitrate (IMDUR) 60 MG 24 hr tablet Take 1 tablet (60 mg total) by mouth daily. 05/22/20   Marinus Maw, MD  levalbuterol [facility] HFA) 45 MCG/ACT inhaler Inhale 1-2 puffs into the lungs every 6 (six) hours as needed for wheezing or shortness of breath.    [provider]  levalbuterol Pauline Aus) 1.25 MG/3ML nebulizer solution  01/01/20   [provider]  metFORMIN (GLUCOPHAGE) 1000 MG tablet Take 1 tablet by mouth 2 (two) times daily. 08/17/15   [provider]  montelukast (SINGULAIR) 10 MG tablet Take 10 mg by mouth every morning.     [provider]  multivitamin-iron-minerals-folic acid (CENTRUM) chewable tablet Chew 1 tablet by mouth daily.    [provider]  nitroGLYCERIN (NITROSTAT) 0.4 MG SL tablet Place 1 tablet (0.4 mg  total) under the tongue every 5 (five) minutes x 3 doses as needed for chest pain. 11/17/19   Arty Baumgartner, NP  oxyCODONE-acetaminophen (PERCOCET) 10-325 MG tablet Take 1 tablet by mouth every 6 (six) hours as needed for pain. 02/24/20   Raeford Razor, MD  pantoprazole (PROTONIX) 40 MG tablet Take 40 mg by mouth daily. 09/26/19   [provider]  sucralfate (CARAFATE) 1 g tablet Take 1 tablet (1 g total) by mouth 4 (four) times daily -  with meals and at bedtime. Patient taking differently: Take 1 g by mouth 2 (two) times daily.  07/09/17   Rolan Bucco, MD    Allergies    Prednisone  Review of Systems   Review of Systems  Constitutional: Positive for chills and fever.  HENT: Negative for congestion.   Respiratory: Positive for cough. Negative for shortness of breath.  Cardiovascular: Negative for chest pain.  Gastrointestinal: Positive for abdominal pain, nausea and vomiting. Negative for blood in stool, constipation and diarrhea.  Genitourinary: Negative for dysuria and frequency.  Musculoskeletal: Negative for arthralgias, back pain and myalgias.  Skin: Negative for rash and wound.  Allergic/Immunologic: Positive for immunocompromised state.  Neurological: Negative for weakness.  Psychiatric/Behavioral: Negative for confusion.  All other systems reviewed and are negative.   Physical Exam Updated Vital Signs BP (!) 144/71 (BP Location: Left Arm)   Pulse 87   Temp (!) 100.9 F (38.3 C) (Oral)   Resp 18   Ht 6\' 6"  (1.981 m)   Wt 113.4 kg   SpO2 98%   BMI 28.89 kg/m   Physical Exam Vitals and nursing note reviewed.  Constitutional:      General: He is not in acute distress.    Appearance: He is well-developed. He is not diaphoretic.  HENT:     Head: Normocephalic and atraumatic.  Cardiovascular:     Rate and Rhythm: Normal rate and regular rhythm.     Heart sounds: Normal heart sounds.  Pulmonary:     Effort: Pulmonary effort is normal.     Breath  sounds: Wheezing present.  Abdominal:     Palpations: Abdomen is soft.     Tenderness: There is generalized abdominal tenderness.  Skin:    General: Skin is warm and dry.     Findings: No erythema or rash.  Neurological:     Mental Status: He is alert and oriented to person, place, and time.  Psychiatric:        Behavior: Behavior normal.     ED Results / Procedures / Treatments   Labs (all labs ordered are listed, but only abnormal results are displayed) Labs Reviewed  RESP PANEL BY RT-PCR (FLU A&B, COVID) ARPGX2 - Abnormal; Notable for the following components:      Result Value   SARS Coronavirus 2 by RT PCR POSITIVE (*)    All other components within normal limits  COMPREHENSIVE METABOLIC PANEL - Abnormal; Notable for the following components:   Glucose, Bld 144 (*)    Total Bilirubin 0.2 (*)    All other components within normal limits  CBC - Abnormal; Notable for the following components:   WBC 11.4 (*)    All other components within normal limits  CBG MONITORING, ED - Abnormal; Notable for the following components:   Glucose-Capillary 134 (*)    All other components within normal limits  LIPASE, BLOOD  URINALYSIS, ROUTINE W REFLEX MICROSCOPIC    EKG None  Radiology CT ABDOMEN PELVIS WO CONTRAST  Result Date: 12/31/2020 CLINICAL DATA:  Abdominal pain and fever. EXAM: CT ABDOMEN AND PELVIS WITHOUT CONTRAST TECHNIQUE: Multidetector CT imaging of the abdomen and pelvis was performed following the standard protocol without IV contrast. COMPARISON:  02/24/2020 FINDINGS: Lower chest: Unremarkable Hepatobiliary: No focal abnormality in the liver on this study without intravenous contrast. There is no evidence for gallstones, gallbladder wall thickening, or pericholecystic fluid. No intrahepatic or extrahepatic biliary dilation. Pancreas: No focal mass lesion. No dilatation of the main duct. No intraparenchymal cyst. No peripancreatic edema. Spleen: No splenomegaly. No focal  mass lesion. Adrenals/Urinary Tract: No adrenal nodule or mass. Kidneys unremarkable on noncontrast imaging. No evidence for hydroureter. The urinary bladder appears normal for the degree of distention. Stomach/Bowel: Stomach is unremarkable. No gastric wall thickening. No evidence of outlet obstruction. Duodenum is normally positioned as is the ligament of Treitz. No small bowel wall  thickening. No small bowel dilatation. The terminal ileum is normal. The appendix is normal. No gross colonic mass. No colonic wall thickening. Vascular/Lymphatic: No abdominal aortic aneurysm. There is no gastrohepatic or hepatoduodenal ligament lymphadenopathy. No retroperitoneal or mesenteric lymphadenopathy. No pelvic sidewall lymphadenopathy. Reproductive: The prostate gland and seminal vesicles are unremarkable. Other: No intraperitoneal free fluid. Musculoskeletal: No worrisome lytic or sclerotic osseous abnormality. Avascular necrosis noted left femoral head. Right-sided L5 pars interarticularis defect noted. IMPRESSION: 1. No acute findings in the abdomen or pelvis. Specifically, no findings to explain the patient's history of abdominal pain and fever. 2. Avascular necrosis left femoral head. Electronically Signed   By: Kennith Center M.D.   On: 12/31/2020 13:43   DG Chest Port 1 View  Result Date: 12/31/2020 CLINICAL DATA:  Left upper quadrant and epigastric pain EXAM: PORTABLE CHEST 1 VIEW COMPARISON:  None. FINDINGS: Unchanged cardiomediastinal silhouette. There is no new focal airspace disease. No pleural effusion or visible pneumothorax. No acute osseous abnormalities. IMPRESSION: No evidence of acute cardiopulmonary disease. Electronically Signed   By: Caprice Renshaw   On: 12/31/2020 13:40    Procedures Procedures   Medications Ordered in ED Medications  HYDROmorphone (DILAUDID) injection 0.5 mg (0.5 mg Intravenous Given 12/31/20 1211)  acetaminophen (TYLENOL) tablet 650 mg (650 mg Oral Given 12/31/20 0953)   sodium chloride 0.9 % bolus 1,000 mL (0 mLs Intravenous Stopped 12/31/20 1237)  HYDROmorphone (DILAUDID) injection 0.5 mg (0.5 mg Intravenous Given 12/31/20 1107)  ondansetron (ZOFRAN) injection 4 mg (4 mg Intravenous Given 12/31/20 1108)    ED Course  I have reviewed the triage vital signs and the nursing notes.  Pertinent labs & imaging results that were available during my care of the patient were reviewed by me and considered in my medical decision making (see chart for details).  Clinical Course as of 12/31/20 1426  Tue Dec 31, 2020  8160 50 year old male with complaint of abdominal pain x2 weeks with onset of fever today, abdominal pain worse since yesterday.  He is found to be febrile on arrival in the emergency room with a temperature of 100.9.  On exam, found to have generalized abdominal tenderness.  Found to have mild wheezing throughout, does have a history of COPD, has frequent cough throughout exam.  Vitals are otherwise reassuring with an O2 sat of 98% on room air. Labs reviewed, has mild leukocytosis with white count of 11.4, CMP without significant findings, lipase within normal limits, urinalysis unremarkable.  Patient is positive for COVID. Due to unknown timeline for his symptoms but possibly up to 2 weeks, do not feel patient advised for antivirals for COVID at this time.  Recommend follow-up with primary care provider, given return to ED precautions. [LM]  1426 Dean [name] was evaluated in Emergency Department on 12/31/2020 for the symptoms described in the history of present illness. He was evaluated in the context of the global COVID-19 pandemic, which necessitated consideration that the patient might be at risk for infection with the SARS-CoV-2 virus that causes COVID-19. Institutional protocols and algorithms that pertain to the evaluation of patients at risk for COVID-19 are in a state of rapid change based on information released by regulatory bodies including the CDC and  federal and state organizations. These policies and algorithms were followed during the patient's care in the ED.  [LM]    Clinical Course User Index [LM] Alden Hipp   MDM Rules/Calculators/A&P  Final Clinical Impression(s) / ED Diagnoses Final diagnoses:  Generalized abdominal pain  COVID    Rx / DC Orders ED Discharge Orders    None       Jeannie FendMurphy, Shamieka Gullo A, PA-C 12/31/20 1426    Terald Sleeperrifan, Matthew J, MD 12/31/20 1816

## 2020-12-31 NOTE — ED Triage Notes (Signed)
Pt arrives pov with driver, c/o LUQ and epigastric pain that started yesterday. Endorses hx of pancreatitis. Endorses nausea, denies emesis

## 2020-12-31 NOTE — ED Notes (Signed)
Report to Jessie,RN

## 2020-12-31 NOTE — ED Notes (Signed)
Patient transported to CT 

## 2021-01-10 ENCOUNTER — Encounter (HOSPITAL_BASED_OUTPATIENT_CLINIC_OR_DEPARTMENT_OTHER): Payer: Self-pay | Admitting: *Deleted

## 2021-01-10 ENCOUNTER — Emergency Department (HOSPITAL_BASED_OUTPATIENT_CLINIC_OR_DEPARTMENT_OTHER)
Admission: EM | Admit: 2021-01-10 | Discharge: 2021-01-10 | Disposition: A | Discharge status: Home / Self Care | Payer: Commercial Managed Care - PPO | Attending: Emergency Medicine | Admitting: Emergency Medicine

## 2021-01-10 ENCOUNTER — Other Ambulatory Visit: Payer: Self-pay

## 2021-01-10 DIAGNOSIS — M25552 Pain in left hip: Secondary | ICD-10-CM | POA: Diagnosis not present

## 2021-01-10 DIAGNOSIS — J45909 Unspecified asthma, uncomplicated: Secondary | ICD-10-CM | POA: Insufficient documentation

## 2021-01-10 DIAGNOSIS — E119 Type 2 diabetes mellitus without complications: Secondary | ICD-10-CM | POA: Diagnosis not present

## 2021-01-10 DIAGNOSIS — M25551 Pain in right hip: Secondary | ICD-10-CM | POA: Diagnosis present

## 2021-01-10 DIAGNOSIS — J449 Chronic obstructive pulmonary disease, unspecified: Secondary | ICD-10-CM | POA: Insufficient documentation

## 2021-01-10 DIAGNOSIS — Z7901 Long term (current) use of anticoagulants: Secondary | ICD-10-CM | POA: Diagnosis not present

## 2021-01-10 DIAGNOSIS — Z7984 Long term (current) use of oral hypoglycemic drugs: Secondary | ICD-10-CM | POA: Diagnosis not present

## 2021-01-10 DIAGNOSIS — Z955 Presence of coronary angioplasty implant and graft: Secondary | ICD-10-CM | POA: Diagnosis not present

## 2021-01-10 DIAGNOSIS — F17200 Nicotine dependence, unspecified, uncomplicated: Secondary | ICD-10-CM | POA: Insufficient documentation

## 2021-01-10 DIAGNOSIS — I480 Paroxysmal atrial fibrillation: Secondary | ICD-10-CM | POA: Insufficient documentation

## 2021-01-10 DIAGNOSIS — Z794 Long term (current) use of insulin: Secondary | ICD-10-CM | POA: Diagnosis not present

## 2021-01-10 DIAGNOSIS — I10 Essential (primary) hypertension: Secondary | ICD-10-CM | POA: Insufficient documentation

## 2021-01-10 DIAGNOSIS — M25559 Pain in unspecified hip: Secondary | ICD-10-CM

## 2021-01-10 MED ORDER — HYDROCODONE-ACETAMINOPHEN 5-325 MG PO TABS
2.0000 | ORAL_TABLET | Freq: Once | ORAL | Status: AC
  Administered 2021-01-10: 2 via ORAL
  Filled 2021-01-10: qty 2

## 2021-01-10 MED ORDER — HYDROCODONE-ACETAMINOPHEN 10-325 MG PO TABS: 1.0000 | ORAL_TABLET | Freq: Three times a day (TID) | ORAL | 0 refills | Status: DC | PRN

## 2021-01-10 NOTE — ED Notes (Signed)
Pt. Complains of both hips and leg pain.

## 2021-01-10 NOTE — ED Triage Notes (Addendum)
He was seen 10 days ago with abdominal pain and diagnosed with Covid. Here today with pain in his legs and right hip pain. States he ran out of Oxycontin yesterday.

## 2021-01-10 NOTE — ED Provider Notes (Signed)
[facility] EMERGENCY DEPARTMENT Provider Note   CSN: [id number] Arrival date & time: 01/10/21  1951     History Chief Complaint  Patient presents with  . Hip Pain    Dean [name] is a 50 y.o. male.  Has chronic avascular necrosis of bilateral hips.  Acute on chronic pain.  Ran out of narcotic pain medicine.  Is supposed to see pain specialist next week.  Denies any new traumas.  The history is provided by the patient.  Hip Pain This is a chronic problem. The current episode started more than 1 week ago. The problem occurs daily. Pertinent negatives include no chest pain, no abdominal pain, no headaches and no shortness of breath. Nothing aggravates the symptoms. Nothing relieves the symptoms. He has tried nothing for the symptoms. The treatment provided no relief.       Past Medical History:  Diagnosis Date  . Anxiety   . Asthma   . Atrial arrhythmia    multiples with 5-6 ablations  . Atrial fibrillation with rapid ventricular response (HCC)    With rates up to 200 as well as short PR tachycardia, cycle length of 240 ms.  . Chest tightness 05/08/14  . Diabetes mellitus, type II (HCC)   . GERD (gastroesophageal reflux disease)   . Morbid obesity (HCC)   . NSTEMI (non-ST elevated myocardial infarction) (HCC)   . Palpitations 05/08/14  . Pancreatitis   . Pneumonia ~ 2010  . Snores    Wil occasional apnea. Had a remote sleep study but does not remember results.  . SOB (shortness of breath) 05/08/14  . SVT (supraventricular tachycardia) (HCC)   . WPW (Wolff-Parkinson-White syndrome)     Patient Active Problem List   Diagnosis Date Noted  . NSTEMI (non-ST elevated myocardial infarction) (HCC) 11/14/2019  . Stroke-like symptoms 05/18/2016  . Smoker 05/18/2016  . COPD (chronic obstructive pulmonary disease) (HCC) 05/18/2016  . ETOH abuse 05/18/2016  . Pulmonary nodule, right 05/18/2016  . CAD-noted on chest CT 05/18/2016  . Family history of coronary artery  disease in father 05/18/2016  . Cerebrovascular accident (CVA) due to thrombosis of right middle cerebral artery (HCC)   . Mixed hyperlipidemia   . Long term current use of anticoagulant 08/16/2015  . Gastric catarrh 08/16/2015  . History of biliary T-tube placement 08/16/2015  . Personal history of other diseases of the circulatory system 08/16/2015  . Polycythemia, secondary 08/16/2015  . Paroxysmal atrial fibrillation (HCC) 07/18/2015  . Morbid obesity (HCC) 07/18/2015  . Essential hypertension 07/18/2015  . Type 2 diabetes mellitus without complication, without long-term current use of insulin (HCC) 05/10/2014  . History of PSVT (paroxysmal supraventricular tachycardia) 05/09/2014  . WPW (Wolff-Parkinson-White syndrome) 05/09/2014  . Avascular necrosis of bone of hip (HCC) 07/12/2013  . Acid reflux 07/12/2013  . Arthralgia of hip 07/12/2013  . Chest pain of unknown etiology 05/01/2013    Past Surgical History:  Procedure Laterality Date  . ATRIAL FIBRILLATION ABLATION     "& WPW"  . CARDIAC CATHETERIZATION     "6 or 7" (05/18/2016)  . CORONARY STENT INTERVENTION N/A 11/15/2019   Procedure: CORONARY STENT INTERVENTION;  Surgeon: Kathleene Hazel, MD;  Location: [facility];  Service: Cardiovascular;  Laterality: N/A;  . LEFT HEART CATH AND CORONARY ANGIOGRAPHY N/A 11/15/2019   Procedure: LEFT HEART CATH AND CORONARY ANGIOGRAPHY;  Surgeon: Kathleene Hazel, MD;  Location: [facility];  Service: Cardiovascular;  Laterality: N/A;  . WRIST FRACTURE  SURGERY Right 1990s       Family History  Problem Relation Age of Onset  . Clotting disorder Father   . Diabetes Father   . Heart attack Father 95  . Hypertension Father     Social History   Tobacco Use  . Smoking status: Current Every Day Smoker    Packs/day: 1.00    Years: 30.00    Pack years: 30.00  . Smokeless tobacco: Never Used  Substance Use Topics  . Alcohol use: No    Comment: none x 2  weeks  . Drug use: No    Home Medications Prior to Admission medications   Medication Sig Start Date End Date Taking? Authorizing Provider  alprazolam Prudy Feeler) 2 MG tablet Take 2 mg by mouth 2 (two) times daily.   Yes [provider]  amiodarone (PACERONE) 200 MG tablet TAKE 1 TABLET ONCE DAILY. Patient taking differently: Take 200 mg by mouth daily. 12/14/16  Yes Marinus Maw, MD  apixaban (ELIQUIS) 5 MG TABS tablet Take 1 tablet (5 mg total) by mouth 2 (two) times daily. 03/05/20  Yes Marinus Maw, MD  atorvastatin (LIPITOR) 80 MG tablet Take 1 tablet (80 mg total) by mouth daily at 6 PM. 11/17/19  Yes Laverda Page B, NP  bisoprolol (ZEBETA) 5 MG tablet TAKE 1 TABLET ONCE DAILY. 05/22/20  Yes Marinus Maw, MD  budesonide-formoterol [facility]) 160-4.5 MCG/ACT inhaler Inhale 2 puffs into the lungs daily as needed (shortness of breath).    Yes [provider]  cetirizine (ZYRTEC) 10 MG tablet Take 10 mg by mouth daily.   Yes [provider]  diltiazem (CARDIZEM CD) 240 MG 24 hr capsule Take 1 capsule (240 mg total) by mouth daily. 05/22/20  Yes Marinus Maw, MD  HYDROcodone-acetaminophen [facility]) 10-325 MG tablet Take 1 tablet by mouth every 8 (eight) hours as needed for up to 25 doses for severe pain. 01/10/21  Yes Laticia Vannostrand, DO  levalbuterol (XOPENEX HFA) 45 MCG/ACT inhaler Inhale 1-2 puffs into the lungs every 6 (six) hours as needed for wheezing or shortness of breath.   Yes [provider]  metFORMIN (GLUCOPHAGE) 1000 MG tablet Take 1 tablet by mouth 2 (two) times daily. 08/17/15  Yes [provider]  montelukast (SINGULAIR) 10 MG tablet Take 10 mg by mouth every morning.    Yes [provider]  multivitamin-iron-minerals-folic acid (CENTRUM) chewable tablet Chew 1 tablet by mouth daily.   Yes [provider]  oxyCODONE-acetaminophen (PERCOCET) 10-325 MG tablet Take 1 tablet by mouth every 6 (six) hours as needed for  pain. 02/24/20  Yes Raeford Razor, MD  pantoprazole (PROTONIX) 40 MG tablet Take 40 mg by mouth daily. 09/26/19  Yes [provider]  insulin lispro (HUMALOG) 100 UNIT/ML injection Inject 8 Units into the skin See admin instructions. Per sliding scale.    [provider]  isosorbide mononitrate (IMDUR) 60 MG 24 hr tablet Take 1 tablet (60 mg total) by mouth daily. 05/22/20   Marinus Maw, MD  levalbuterol Pauline Aus) 1.25 MG/3ML nebulizer solution  01/01/20   [provider]  nitroGLYCERIN (NITROSTAT) 0.4 MG SL tablet Place 1 tablet (0.4 mg total) under the tongue every 5 (five) minutes x 3 doses as needed for chest pain. 11/17/19   Arty Baumgartner, NP  sucralfate (CARAFATE) 1 g tablet Take 1 tablet (1 g total) by mouth 4 (four) times daily -  with meals and at bedtime. Patient taking differently: Take 1  g by mouth 2 (two) times daily. 07/09/17   Rolan BuccoBelfi, Melanie, MD    Allergies    Prednisone  Review of Systems   Review of Systems  Constitutional: Negative for fever.  Respiratory: Negative for shortness of breath.   Cardiovascular: Negative for chest pain.  Gastrointestinal: Negative for abdominal pain.  Musculoskeletal: Positive for arthralgias. Negative for back pain, gait problem, joint swelling and myalgias.  Skin: Negative for color change, pallor, rash and wound.  Neurological: Negative for weakness, numbness and headaches.  Psychiatric/Behavioral: Negative for dysphoric mood.    Physical Exam Updated Vital Signs BP 125/78 (BP Location: Left Arm)   Pulse 63   Temp 98.7 F (37.1 C) (Oral)   Resp 14   Ht 6\' 6"  (1.981 m)   Wt 113.4 kg   SpO2 96%   BMI 28.89 kg/m   Physical Exam Constitutional:      General: He is not in acute distress.    Appearance: He is not ill-appearing.  HENT:     Head: Normocephalic.  Cardiovascular:     Pulses: Normal pulses.  Musculoskeletal:        General: Tenderness present. No deformity. Normal range of motion.      Comments: Tenderness to bilateral hips but normal range of motion, no swelling no deformity  Skin:    Capillary Refill: Capillary refill takes less than 2 seconds.  Neurological:     General: No focal deficit present.     Mental Status: He is alert.     Sensory: No sensory deficit.     Motor: No weakness.     Comments: 5+ out of 5 strength throughout, normal sensation     ED Results / Procedures / Treatments   Labs (all labs ordered are listed, but only abnormal results are displayed) Labs Reviewed - No data to display  EKG None  Radiology No results found.  Procedures Procedures   Medications Ordered in ED Medications  HYDROcodone-acetaminophen (NORCO/VICODIN) 5-325 MG per tablet 2 tablet (2 tablets Oral Given 01/10/21 2035)    ED Course  I have reviewed the triage vital signs and the nursing notes.  Pertinent labs & imaging results that were available during my care of the patient were reviewed by me and considered in my medical decision making (see chart for details).    MDM Rules/Calculators/A&P                          Dean [name] is here with chronic hip pain.  History of avascular necrosis of both hips.  Is supposed to have hip replacement bilaterally.  Is supposed to see of chronic pain doctor next week for pain management until he can have hips replaced.  Has been on and off narcotics during this time.  Denies any new trauma.  Neurovascular neuromuscularly is intact on exam.  Will prescribe narcotic pain medicine for breakthrough pain.  No concern for blood clot, new trauma, arterial disease.  He has good pulses in his feet.  No leg swelling.  Good range of motion with minimal pain.  Overall suspect acute on chronic pain.  Discharged in good condition.  This chart was dictated using voice recognition software.  Despite best efforts to proofread,  errors can occur which can change the documentation meaning.   Final Clinical Impression(s) / ED Diagnoses Final  diagnoses:  Hip pain    Rx / DC Orders ED Discharge Orders  Ordered    HYDROcodone-acetaminophen (NORCO) 10-325 MG tablet  Every 8 hours PRN        01/10/21 2038           Virgina Norfolk, DO 01/10/21 2042

## 2021-01-20 ENCOUNTER — Other Ambulatory Visit: Payer: Self-pay

## 2021-01-20 ENCOUNTER — Emergency Department (HOSPITAL_COMMUNITY)
Admission: EM | Admit: 2021-01-20 | Discharge: 2021-01-20 | Disposition: A | Discharge status: Home / Self Care | Payer: Commercial Managed Care - PPO | Attending: Emergency Medicine | Admitting: Emergency Medicine

## 2021-01-20 ENCOUNTER — Encounter (HOSPITAL_COMMUNITY): Payer: Self-pay | Admitting: Emergency Medicine

## 2021-01-20 DIAGNOSIS — I456 Pre-excitation syndrome: Secondary | ICD-10-CM | POA: Insufficient documentation

## 2021-01-20 DIAGNOSIS — E1169 Type 2 diabetes mellitus with other specified complication: Secondary | ICD-10-CM | POA: Insufficient documentation

## 2021-01-20 DIAGNOSIS — Z79899 Other long term (current) drug therapy: Secondary | ICD-10-CM | POA: Insufficient documentation

## 2021-01-20 DIAGNOSIS — I2510 Atherosclerotic heart disease of native coronary artery without angina pectoris: Secondary | ICD-10-CM | POA: Insufficient documentation

## 2021-01-20 DIAGNOSIS — R638 Other symptoms and signs concerning food and fluid intake: Secondary | ICD-10-CM | POA: Diagnosis not present

## 2021-01-20 DIAGNOSIS — R109 Unspecified abdominal pain: Secondary | ICD-10-CM | POA: Diagnosis present

## 2021-01-20 DIAGNOSIS — J45909 Unspecified asthma, uncomplicated: Secondary | ICD-10-CM | POA: Insufficient documentation

## 2021-01-20 DIAGNOSIS — R1011 Right upper quadrant pain: Secondary | ICD-10-CM | POA: Insufficient documentation

## 2021-01-20 DIAGNOSIS — F17210 Nicotine dependence, cigarettes, uncomplicated: Secondary | ICD-10-CM | POA: Diagnosis not present

## 2021-01-20 DIAGNOSIS — K219 Gastro-esophageal reflux disease without esophagitis: Secondary | ICD-10-CM | POA: Insufficient documentation

## 2021-01-20 DIAGNOSIS — Z7951 Long term (current) use of inhaled steroids: Secondary | ICD-10-CM | POA: Diagnosis not present

## 2021-01-20 DIAGNOSIS — G8929 Other chronic pain: Secondary | ICD-10-CM | POA: Diagnosis not present

## 2021-01-20 DIAGNOSIS — E782 Mixed hyperlipidemia: Secondary | ICD-10-CM | POA: Diagnosis not present

## 2021-01-20 DIAGNOSIS — J449 Chronic obstructive pulmonary disease, unspecified: Secondary | ICD-10-CM | POA: Diagnosis not present

## 2021-01-20 DIAGNOSIS — Z7902 Long term (current) use of antithrombotics/antiplatelets: Secondary | ICD-10-CM | POA: Diagnosis not present

## 2021-01-20 DIAGNOSIS — R1012 Left upper quadrant pain: Secondary | ICD-10-CM | POA: Diagnosis not present

## 2021-01-20 DIAGNOSIS — Z7901 Long term (current) use of anticoagulants: Secondary | ICD-10-CM | POA: Diagnosis not present

## 2021-01-20 DIAGNOSIS — R1013 Epigastric pain: Secondary | ICD-10-CM | POA: Insufficient documentation

## 2021-01-20 LAB — CBC WITH DIFFERENTIAL/PLATELET
Abs Immature Granulocytes: 0.05 10*3/uL (ref 0.00–0.07)
Basophils Absolute: 0.1 10*3/uL (ref 0.0–0.1)
Basophils Relative: 0 %
Eosinophils Absolute: 0.1 10*3/uL (ref 0.0–0.5)
Eosinophils Relative: 1 %
HCT: 42.9 % (ref 39.0–52.0)
Hemoglobin: 14.6 g/dL (ref 13.0–17.0)
Immature Granulocytes: 0 %
Lymphocytes Relative: 16 %
Lymphs Abs: 2.1 10*3/uL (ref 0.7–4.0)
MCH: 30.7 pg (ref 26.0–34.0)
MCHC: 34 g/dL (ref 30.0–36.0)
MCV: 90.1 fL (ref 80.0–100.0)
Monocytes Absolute: 1 10*3/uL (ref 0.1–1.0)
Monocytes Relative: 7 %
Neutro Abs: 10.2 10*3/uL — ABNORMAL HIGH (ref 1.7–7.7)
Neutrophils Relative %: 76 %
Platelets: 326 10*3/uL (ref 150–400)
RBC: 4.76 MIL/uL (ref 4.22–5.81)
RDW: 12.6 % (ref 11.5–15.5)
WBC: 13.5 10*3/uL — ABNORMAL HIGH (ref 4.0–10.5)
nRBC: 0 % (ref 0.0–0.2)

## 2021-01-20 LAB — COMPREHENSIVE METABOLIC PANEL
ALT: 26 U/L (ref 0–44)
AST: 19 U/L (ref 15–41)
Albumin: 4.2 g/dL (ref 3.5–5.0)
Alkaline Phosphatase: 106 U/L (ref 38–126)
Anion gap: 9 (ref 5–15)
BUN: 11 mg/dL (ref 6–20)
CO2: 29 mmol/L (ref 22–32)
Calcium: 9.3 mg/dL (ref 8.9–10.3)
Chloride: 97 mmol/L — ABNORMAL LOW (ref 98–111)
Creatinine, Ser: 0.78 mg/dL (ref 0.61–1.24)
GFR, Estimated: >60 mL/min (ref >60)
Glucose, Bld: 122 mg/dL — ABNORMAL HIGH (ref 70–99)
Potassium: 4.2 mmol/L (ref 3.5–5.1)
Sodium: 135 mmol/L (ref 135–145)
Total Bilirubin: 0.9 mg/dL (ref 0.3–1.2)
Total Protein: 7.3 g/dL (ref 6.5–8.1)

## 2021-01-20 LAB — LIPASE, BLOOD: Lipase: 21 U/L (ref 11–51)

## 2021-01-20 MED ORDER — ONDANSETRON 8 MG PO TBDP: 8.0000 mg | ORAL_TABLET | Freq: Three times a day (TID) | ORAL | 0 refills | Status: DC | PRN

## 2021-01-20 MED ORDER — MORPHINE SULFATE (PF) 4 MG/ML IV SOLN
8.0000 mg | Freq: Once | INTRAVENOUS | Status: AC
Start: 2021-01-20 — End: 2021-01-20
  Administered 2021-01-20: 8 mg via INTRAVENOUS
  Filled 2021-01-20: qty 2

## 2021-01-20 MED ORDER — OXYCODONE HCL 5 MG PO TABS
10.0000 mg | ORAL_TABLET | Freq: Once | ORAL | Status: AC
  Administered 2021-01-20: 10 mg via ORAL
  Filled 2021-01-20: qty 2

## 2021-01-20 MED ORDER — FENTANYL CITRATE (PF) 100 MCG/2ML IJ SOLN
50.0000 ug | Freq: Once | INTRAMUSCULAR | Status: AC
Start: 2021-01-20 — End: 2021-01-20
  Administered 2021-01-20: 50 ug via INTRAVENOUS
  Filled 2021-01-20: qty 2

## 2021-01-20 NOTE — ED Triage Notes (Signed)
Pt reports LUQ abdominal pain x 5 weeks. Pt reports the pain comes and goes. Pt also reports bilateral hip pain.

## 2021-01-20 NOTE — ED Notes (Signed)
Re beeped to [phone].Pain Doctor

## 2021-01-20 NOTE — ED Notes (Signed)
Pt states his pain is still a 7. Md aware of pts pain.

## 2021-01-20 NOTE — Discharge Instructions (Signed)
You are seen in the ER for your abdominal pain, hip pain.  We recommend that you call your primary care doctor to see if they can prescribe you the narcotics until the pain specialist has completed their procedures.  We have prescribed you some nausea medications.

## 2021-01-20 NOTE — ED Notes (Signed)
Beeped Pain Doctor to [phone].

## 2021-01-20 NOTE — ED Provider Notes (Addendum)
[facility] EMERGENCY DEPARTMENT Provider Note   CSN: [id number] Arrival date & time: 01/20/21  6237     History Chief Complaint  Patient presents with  . Abdominal Pain    Dean [name] is a 50 y.o. male.  HPI    50 year old male comes in with chief complaint of abdominal pain. 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  He was recently referred to pain specialist.  During that visit, he was asked to wean off of his narcotic and start taking gabapentin.  They have scheduled nerve blocks for him.  Patient reports that he could not sleep well at all last night.  He is having abdominal pain, having difficulty tolerating any food.  He is also having pain in his hips and is unable to walk because of the pain.  He is seeking some pain relief.   Past Medical History:  Diagnosis Date  . Anxiety   . Asthma   . Atrial arrhythmia    multiples with 5-6 ablations  . Atrial fibrillation with rapid ventricular response (HCC)    With rates up to 200 as well as short PR tachycardia, cycle length of 240 ms.  . Chest tightness 05/08/14  . Diabetes mellitus, type II (HCC)   . GERD (gastroesophageal reflux disease)   . Morbid obesity (HCC)   . NSTEMI (non-ST elevated myocardial infarction) (HCC)   . Palpitations 05/08/14  . Pancreatitis   . Pneumonia ~ 2010  . Snores    Wil occasional apnea. Had a remote sleep study but does not remember results.  . SOB (shortness of breath) 05/08/14  . SVT (supraventricular tachycardia) (HCC)   . WPW (Wolff-Parkinson-White syndrome)     Patient Active Problem List   Diagnosis Date Noted  . NSTEMI (non-ST elevated myocardial infarction) (HCC) 11/14/2019  . Stroke-like symptoms 05/18/2016  . Smoker 05/18/2016  . COPD (chronic obstructive pulmonary disease) (HCC) 05/18/2016  . ETOH abuse 05/18/2016  . Pulmonary nodule, right 05/18/2016  . CAD-noted on chest CT 05/18/2016  . Family history of coronary artery disease in  father 05/18/2016  . Cerebrovascular accident (CVA) due to thrombosis of right middle cerebral artery (HCC)   . Mixed hyperlipidemia   . Long term current use of anticoagulant 08/16/2015  . Gastric catarrh 08/16/2015  . History of biliary T-tube placement 08/16/2015  . Personal history of other diseases of the circulatory system 08/16/2015  . Polycythemia, secondary 08/16/2015  . Paroxysmal atrial fibrillation (HCC) 07/18/2015  . Morbid obesity (HCC) 07/18/2015  . Essential hypertension 07/18/2015  . Type 2 diabetes mellitus without complication, without long-term current use of insulin (HCC) 05/10/2014  . History of PSVT (paroxysmal supraventricular tachycardia) 05/09/2014  . WPW (Wolff-Parkinson-White syndrome) 05/09/2014  . Avascular necrosis of bone of hip (HCC) 07/12/2013  . Acid reflux 07/12/2013  . Arthralgia of hip 07/12/2013  . Chest pain of unknown etiology 05/01/2013    Past Surgical History:  Procedure Laterality Date  . ATRIAL FIBRILLATION ABLATION     "& WPW"  . CARDIAC CATHETERIZATION     "6 or 7" (05/18/2016)  . CORONARY STENT INTERVENTION N/A 11/15/2019   Procedure: CORONARY STENT INTERVENTION;  Surgeon: Kathleene Hazel, MD;  Location: [facility] INVASIVE CV LAB;  Service: Cardiovascular;  Laterality: N/A;  . LEFT HEART CATH AND CORONARY ANGIOGRAPHY N/A 11/15/2019   Procedure: LEFT HEART CATH AND CORONARY ANGIOGRAPHY;  Surgeon: Kathleene Hazel, MD;  Location: [facility] INVASIVE CV LAB;  Service: Cardiovascular;  Laterality: N/A;  .  WRIST FRACTURE SURGERY Right 1990s       Family History  Problem Relation Age of Onset  . Clotting disorder Father   . Diabetes Father   . Heart attack Father [id number]  . Hypertension Father     Social History   Tobacco Use  . Smoking status: Current Every Day Smoker    Packs/day: 1.00    Years: 30.00    Pack years: 30.00  . Smokeless tobacco: Never Used  Substance Use Topics  . Alcohol use: No    Comment: none x 2 weeks  .  Drug use: No    Home Medications Prior to Admission medications   Medication Sig Start Date End Date Taking? Authorizing Provider  alprazolam Prudy Feeler(XANAX) 2 MG tablet Take 2 mg by mouth 3 (three) times daily.   Yes [provider]  amiodarone (PACERONE) 200 MG tablet TAKE 1 TABLET ONCE DAILY. Patient taking differently: Take 200 mg by mouth daily. 12/14/16  Yes Marinus Mawaylor, Gregg W, MD  atorvastatin (LIPITOR) 80 MG tablet Take 1 tablet (80 mg total) by mouth daily at 6 PM. 11/17/19  Yes Laverda Pageoberts, Lindsay B, NP  bisoprolol (ZEBETA) 5 MG tablet TAKE 1 TABLET ONCE DAILY. 05/22/20  Yes Marinus Mawaylor, Gregg W, MD  budesonide-formoterol [facility](SYMBICORT) 160-4.5 MCG/ACT inhaler Inhale 2 puffs into the lungs daily as needed (shortness of breath).    Yes [provider]  cetirizine (ZYRTEC) 10 MG tablet Take 10 mg by mouth daily.   Yes [provider]  clopidogrel (PLAVIX) 75 MG tablet Take 1 tablet by mouth daily. 12/27/20  Yes [provider]  diltiazem (CARDIZEM CD) 240 MG 24 hr capsule Take 1 capsule (240 mg total) by mouth daily. 05/22/20  Yes Marinus Mawaylor, Gregg W, MD  gabapentin (NEURONTIN) 300 MG capsule Take by mouth. Start 3 pills QHS for 3 days, then increase to 1 in the AM and 3 pills QHS for 3 days, then take 1 in AM, 1 at lunch and 3 pills QHS for 3 days, then take 2 in the AM, 1 at lunch and 3 pills QHS for 3 days, then take 2 pills in AM, 2 pills at lunch and 3 pills QHS for 3 days, then take 2 pills in the AM, 2 pills at lunch and 4 pills QHS and continue. 01/16/21  Yes [provider]  isosorbide mononitrate (IMDUR) 60 MG 24 hr tablet Take 1 tablet (60 mg total) by mouth daily. Patient taking differently: Take 30 mg by mouth daily. 05/22/20  Yes Marinus Mawaylor, Gregg W, MD  lactulose (CHRONULAC) 10 GM/15ML solution Take 15 mLs by mouth 2 (two) times daily. 12/21/20  Yes [provider]  levalbuterol (XOPENEX HFA) 45 MCG/ACT inhaler Inhale 1-2 puffs into the lungs every 6 (six) hours  as needed for wheezing or shortness of breath.   Yes [provider]  levalbuterol (XOPENEX) 1.25 MG/3ML nebulizer solution Take 1.25 mg by nebulization every 6 (six) hours as needed. 01/01/20  Yes [provider]  metFORMIN (GLUCOPHAGE) 1000 MG tablet Take 1 tablet by mouth 2 (two) times daily. 08/17/15  Yes [provider]  montelukast (SINGULAIR) 10 MG tablet Take 10 mg by mouth every morning.    Yes [provider]  multivitamin-iron-minerals-folic acid (CENTRUM) chewable tablet Chew 1 tablet by mouth daily.   Yes [provider]  nitroGLYCERIN (NITROSTAT) 0.4 MG SL tablet Place 1 tablet (0.4 mg total) under the tongue every 5 (five) minutes x 3 doses as needed for chest pain. 11/17/19  Yes Laverda Page B, NP  ondansetron (ZOFRAN ODT) 8 MG disintegrating tablet Take 1 tablet (8 mg total) by mouth every 8 (eight) hours as needed for nausea. 01/20/21  Yes Derwood Kaplan, MD  oxyCODONE-acetaminophen (PERCOCET) 10-325 MG tablet Take 1 tablet by mouth every 6 (six) hours as needed for pain. 02/24/20  Yes Raeford Razor, MD  pantoprazole (PROTONIX) 40 MG tablet Take 40 mg by mouth daily. 09/26/19  Yes [provider]  sucralfate (CARAFATE) 1 g tablet Take 1 tablet (1 g total) by mouth 4 (four) times daily -  with meals and at bedtime. Patient taking differently: Take 1 g by mouth 4 (four) times daily. 07/09/17  Yes Rolan Bucco, MD  apixaban (ELIQUIS) 5 MG TABS tablet Take 1 tablet (5 mg total) by mouth 2 (two) times daily. Patient not taking: Reported on 01/20/2021 03/05/20   Marinus Maw, MD  HYDROcodone-acetaminophen [facility]) 10-325 MG tablet Take 1 tablet by mouth every 8 (eight) hours as needed for up to 25 doses for severe pain. Patient not taking: No sig reported 01/10/21   Virgina Norfolk, DO    Allergies    Prednisone  Review of Systems   Review of Systems  Constitutional: Positive for activity change.  Respiratory: Negative for  shortness of breath.   Cardiovascular: Negative for chest pain.  Gastrointestinal: Positive for abdominal pain. Negative for nausea and vomiting.  All other systems reviewed and are negative.   Physical Exam Updated Vital Signs BP (!) 151/94   Pulse 69   Resp 16   Wt 113.4 kg   SpO2 95%   BMI 28.89 kg/m   Physical Exam Vitals and nursing note reviewed.  Constitutional:      Appearance: He is well-developed.  HENT:     Head: Atraumatic.  Cardiovascular:     Rate and Rhythm: Normal rate.  Pulmonary:     Effort: Pulmonary effort is normal.  Abdominal:     Tenderness: There is abdominal tenderness in the right upper quadrant, epigastric area and left upper quadrant. There is no rebound.  Musculoskeletal:     Cervical back: Neck supple.  Skin:    General: Skin is warm.  Neurological:     Mental Status: He is alert and oriented to person, place, and time.     ED Results / Procedures / Treatments   Labs (all labs ordered are listed, but only abnormal results are displayed) Labs Reviewed  COMPREHENSIVE METABOLIC PANEL - Abnormal; Notable for the following components:      Result Value   Chloride 97 (*)    Glucose, Bld 122 (*)    All other components within normal limits  CBC WITH DIFFERENTIAL/PLATELET - Abnormal; Notable for the following components:   WBC 13.5 (*)    Neutro Abs 10.2 (*)    All other components within normal limits  LIPASE, BLOOD    EKG None  Radiology No results found.  Procedures Procedures   Medications Ordered in ED Medications  oxyCODONE (Oxy IR/ROXICODONE) immediate release tablet 10 mg (has no administration in time range)  fentaNYL (SUBLIMAZE) injection 50 mcg (has no administration in time range)  morphine 4 MG/ML injection 8 mg (8 mg Intravenous Given 01/20/21 0900)    ED Course  I have reviewed the triage vital signs and the nursing notes.  Pertinent labs & imaging results that were available during my care of the patient were  reviewed by me and considered in my medical decision making (see chart for details).  Clinical Course as of 01/20/21 1052  Mon Jan 20, 2021  1050 Spoke with patient's pain specialist.  He reports that he is comfortable with patient being given narcotics by his primary care doctor.  He will be performing the interventional procedures.  It appears that patient was getting narcotic medications prescribed by PCP.  It appears that he should be running out of it tomorrow.  Additionally, he was prescribed 25 or Norco last week.  I have advised him to call his PCP today. [AN]    Clinical Course User Index [AN] Derwood Kaplan, MD   MDM Rules/Calculators/A&P                          50 year old male comes in with chief complaint of abdominal pain and hip pain.  He has history of chronic pain in both.  It appears that the hip pain is likely because of avascular necrosis in the former because of chronic pancreatitis.  Patient is not using any alcohol anymore.  He was taken off of his narcotics recently by the pain doctors and is on gabapentin only.  It appears that they have scheduled some nerve blocks.  Exam is reassuring.  Suspect chronic pain.  I had put in a page out for the pain specialist, but did not hear back from them in reasonable time.  I will get labs and get patient morphine.  See if we hear back from the pain specialist, if not then we will discharge patient after acutely managing his pain better.   Final Clinical Impression(s) / ED Diagnoses Final diagnoses:  Chronic abdominal pain    Rx / DC Orders ED Discharge Orders         Ordered    ondansetron (ZOFRAN ODT) 8 MG disintegrating tablet  Every 8 hours PRN        01/20/21 1049           Derwood Kaplan, MD 01/20/21 9518    Derwood Kaplan, MD 01/20/21 1052

## 2021-02-06 ENCOUNTER — Telehealth: Payer: Self-pay

## 2021-02-06 ENCOUNTER — Ambulatory Visit (INDEPENDENT_AMBULATORY_CARE_PROVIDER_SITE_OTHER): Payer: Commercial Managed Care - PPO | Admitting: Orthopaedic Surgery

## 2021-02-06 ENCOUNTER — Ambulatory Visit: Payer: Self-pay

## 2021-02-06 ENCOUNTER — Other Ambulatory Visit: Payer: Self-pay

## 2021-02-06 DIAGNOSIS — M87052 Idiopathic aseptic necrosis of left femur: Secondary | ICD-10-CM

## 2021-02-06 NOTE — Telephone Encounter (Signed)
   [facility] Pre-operative Risk Assessment    Patient Name: Dean [name]  DOB: [birth year]  MRN: [id number]   HEARTCARE STAFF: - Please ensure there is not already an duplicate clearance open for this procedure. - Under Visit Info/Reason for Call, type in Other and utilize the format Clearance MM/DD/YY or Clearance TBD. Do not use dashes or single digits. - If request is for dental extraction, please clarify the # of teeth to be extracted. - If the patient is currently at the dentist's office, call Pre-Op APP to address. If the patient is not currently in the dentist office, please route to the Pre-Op pool  Request for surgical clearance:  What type of surgery is being performed? Left Total Hip Arthroplasty   When is this surgery scheduled? 02/19/2021   What type of clearance is required (medical clearance vs. Pharmacy clearance to hold med vs. Both)? Medical   Are there any medications that need to be held prior to surgery and how long? N/A   Practice name and name of physician performing surgery? [facility]     Dr Dean [name]  What is the office phone number? [phone] Attn: Debbie   7.   What is the office fax number? [phone]  8.   Anesthesia type (None, local, MAC, general) ? None specified    Dean [name] 02/06/2021, 5:30 PM  _________________________________________________________________   (provider comments below)

## 2021-02-06 NOTE — Progress Notes (Signed)
Office Visit Note   Patient: Dean [name]           Date of Birth: 02-14-1971           MRN: [id number] Visit Date: 02/06/2021 Requested by: Veverly Fells, MD [address] [place],  [place] [address] PCP: Veverly Fells, MD  Subjective: Chief Complaint  Patient presents with   Left Hip - Pain   Right Hip - Pain    HPI: Dean [name] is a 50 y.o. male who presents to the office complaining of bilateral hip pain.  Patient complains of chronic pain for several years.  Localizes pain to the groin with radiation down to the anterior knee.  He complains of bilateral hip pain since a diagnosis of avascular necrosis in 2011.  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  Now over the last 3 months he has had worsening pain with severe pain and giving out of the bilateral hips to the point that it is caused him to fall multiple times.  He has MRI of both hips demonstrating avascular necrosis from outside facility.  He is reviewed on his phone.  He does have history of Wolff-Parkinson-White and atrial fibrillation.  Takes Plavix but no aspirin.  Dr. Ladona Ridgel is his cardiologist.  Also has history of diabetes with last A1c 7.1 about 2 weeks ago.  He has discontinued smoking.  He is currently scheduled for surgery in August at [facility] but he states he cannot wait that long.  No history of hip or back surgery.  He states that he is talked with his primary care physician who has said that he is cleared for surgery.                ROS: All systems reviewed are negative as they relate to the chief complaint within the history of present illness.  Patient denies fevers or chills.  Assessment & Plan: Visit Diagnoses:  1. Avascular necrosis of bone of left hip (HCC)     Plan: Patient is a 50 year old male who presents complaint of bilateral hip pain.  Has history of years of hip pain with imaging showing avascular necrosis of bilateral hips.  He is in  severe pain and it is affecting his ability to perform his job and do daily activities.  He would like to proceed with surgery as soon as possible.  Based on options he would like to proceed with a left total hip replacement as soon as possible.  We will get the necessary clearances from Dr.Taylor to start Plavix a week in advance of surgery as well as for surgery.  Risk benefits rehab recovery time including infection, leg length discrepancy, dislocation, DVT reviewed with the patient.  Questions encouraged and answered.  Follow-Up Instructions: Return for postop.   Orders:  Orders Placed This Encounter  Procedures   XR Pelvis 1-2 Views   No orders of the defined types were placed in this encounter.     Procedures: No procedures performed   Clinical Data: No additional findings.  Objective: Vital Signs: There were no vitals taken for this visit.  Physical Exam:  Constitutional: Patient appears well-developed HEENT:  Head: Normocephalic Eyes:EOM are normal Neck: Normal range of motion Cardiovascular: Normal rate Pulmonary/chest: Effort normal Neurologic: Patient is alert Skin: Skin is warm Psychiatric: Patient has normal mood and affect  Ortho Exam: Ortho exam demonstrates 2+ PT  pulses bilaterally.  No calf tenderness.  Severe pain with passive hip flexion and internal rotation of bilateral hips.  No significant tenderness over the greater trochanter bilaterally.  Specialty Comments:  No specialty comments available.  Imaging: XR Pelvis 1-2 Views  Result Date: 02/06/2021 Small focus of AVN of the left femoral head with minimal collapse.  Moderate DJD of the right hip.    PMFS History: Patient Active Problem List   Diagnosis Date Noted   NSTEMI (non-ST elevated myocardial infarction) (HCC) 11/14/2019   Stroke-like symptoms 05/18/2016   Smoker 05/18/2016   COPD (chronic obstructive pulmonary disease) (HCC) 05/18/2016   ETOH abuse 05/18/2016   Pulmonary nodule,  right 05/18/2016   CAD-noted on chest CT 05/18/2016   Family history of coronary artery disease in father 05/18/2016   Cerebrovascular accident (CVA) due to thrombosis of right middle cerebral artery (HCC)    Mixed hyperlipidemia    Long term current use of anticoagulant 08/16/2015   Gastric catarrh 08/16/2015   History of biliary T-tube placement 08/16/2015   Personal history of other diseases of the circulatory system 08/16/2015   Polycythemia, secondary 08/16/2015   Paroxysmal atrial fibrillation (HCC) 07/18/2015   Morbid obesity (HCC) 07/18/2015   Essential hypertension 07/18/2015   Type 2 diabetes mellitus without complication, without long-term current use of insulin (HCC) 05/10/2014   History of PSVT (paroxysmal supraventricular tachycardia) 05/09/2014   WPW (Wolff-Parkinson-White syndrome) 05/09/2014   Avascular necrosis of bone of hip (HCC) 07/12/2013   Acid reflux 07/12/2013   Arthralgia of hip 07/12/2013   Chest pain of unknown etiology 05/01/2013   Past Medical History:  Diagnosis Date   Anxiety    Asthma    Atrial arrhythmia    multiples with 5-6 ablations   Atrial fibrillation with rapid ventricular response (HCC)    With rates up to 200 as well as short PR tachycardia, cycle length of 240 ms.   Chest tightness 05/08/14   Diabetes mellitus, type II (HCC)    GERD (gastroesophageal reflux disease)    Morbid obesity (HCC)    NSTEMI (non-ST elevated myocardial infarction) (HCC)    Palpitations 05/08/14   Pancreatitis    Pneumonia ~ 2010   Snores    Wil occasional apnea. Had a remote sleep study but does not remember results.   SOB (shortness of breath) 05/08/14   SVT (supraventricular tachycardia) (HCC)    WPW (Wolff-Parkinson-White syndrome)     Family History  Problem Relation Age of Onset   Clotting disorder Father    Diabetes Father    Heart attack Father 63   Hypertension Father     Past Surgical History:  Procedure Laterality Date   ATRIAL FIBRILLATION  ABLATION     "& WPW"   CARDIAC CATHETERIZATION     "6 or 7" (05/18/2016)   CORONARY STENT INTERVENTION N/A 11/15/2019   Procedure: CORONARY STENT INTERVENTION;  Surgeon: Kathleene Hazel, MD;  Location: [facility] INVASIVE CV LAB;  Service: Cardiovascular;  Laterality: N/A;   LEFT HEART CATH AND CORONARY ANGIOGRAPHY N/A 11/15/2019   Procedure: LEFT HEART CATH AND CORONARY ANGIOGRAPHY;  Surgeon: Kathleene Hazel, MD;  Location: [facility] INVASIVE CV LAB;  Service: Cardiovascular;  Laterality: N/A;   WRIST FRACTURE SURGERY Right 1990s   Social History   Occupational History   Not on file  Tobacco Use   Smoking status: Every Day    Packs/day: 1.00    Years: 30.00    Pack years: 30.00    Types: Cigarettes  Smokeless tobacco: Never  Substance and Sexual Activity   Alcohol use: No    Comment: none x 2 weeks   Drug use: No   Sexual activity: Yes

## 2021-02-07 ENCOUNTER — Other Ambulatory Visit: Payer: Self-pay

## 2021-02-07 NOTE — Telephone Encounter (Signed)
Primary Cardiologist:Gregg Ladona Ridgel, MD  Chart reviewed as part of pre-operative protocol coverage. Because of Dean [name]'s past medical history and time since last visit, he/she will require a follow-up visit in order to better assess preoperative cardiovascular risk.  Pre-op covering staff: - Please schedule appointment and call patient to inform them. - Please contact requesting surgeon's office via preferred method (i.e, phone, fax) to inform them of need for appointment prior to surgery.  If applicable, this message will also be routed to pharmacy pool and/or primary cardiologist for input on holding anticoagulant/antiplatelet agent as requested below so that this information is available at time of patient's appointment.   Ronney Asters, NP  02/07/2021, 8:32 AM

## 2021-02-07 NOTE — Telephone Encounter (Signed)
Pt has appt 6/27 with Earle Gell, PA for pre op clearance. I will forward note to [facility] for upcoming appt. Will send FYI to surgeon's office pt has appt 6/27.

## 2021-02-07 NOTE — Telephone Encounter (Signed)
Will send message to EP scheduler [place] to set up a pre op appt with Dr. Ladona Ridgel or EP APP.

## 2021-02-09 NOTE — Progress Notes (Signed)
Cardiology Office Note Date:  02/10/2021  Patient ID:  Dean, [name] Feb 28, 1971, MRN [id number] PCP:  Veverly Fells, MD  Electrophysiologist: Dr. Ladona Ridgel    Chief Complaint: pre-op clearance  History of Present Illness: Dean [name] is a 50 y.o. 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  More recently, patient presented to the hospital on 11/14/2019 with chest pain started 4 days prior to arrival.  High-sensitivity troponin on arrival was 114.  Cardiac catheterization performed on 11/15/2019 showed 40% proximal RCA lesion, 99% distal RCA lesion treated with DES, 80% proximal LAD lesion treated with DES, EF 50 to 55%.  Postprocedure, he was placed on aspirin and Plavix in anticipation of stopping the aspirin after a month since the patient is also on Eliquis.  Echocardiogram obtained on 11/15/2019 showed EF 45 to 50%, mild hypokinesis of the left ventricular, basal mid anteroseptal wall and anterior segment.  Due to ongoing pain post-cath, he was continued on IV heparin until discharge and was placed on Imdur. He had cardiology follow up in April 2021 with recommendations to f/u in 2-3 mo, though has not.  He is requiring cardiac clearance for L hip surgery planned for 02/19/21  12/31/20 [facility] ER with abd pain, nausea/vomiting, was wheezing, Rx pain med and was COVID +  01/10/21 AT [facility] ER with hip pain, Rx med  He was in [facility] ER 01/20/21 with abd pain, nausea, was weaning off his narcotic to gabapentin, reported inability to walk with severe hip pain and requesting pain relief. Rx med deferred to PCP  He last saw Dr. Ladona Ridgel April 2018 Mentioned hx of several prior ablation with persistent WPW and historically with increasing Af burden started on amiodarone and  doing better.  EKG that day had no pre-excitation.  TODAY He reports his frustration of having to come to the appointment for this that all these doctors visit are going to run him dry financially. 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  Says he would remember that pain if he ever had it again.  He reports good exertional capacity and no SOB, DOE, no near syncope or syncope, no dizzy spells. About 12 weeks ago his hips got so bad that he has not been as physically active, some days can barely get around he is in so much pain. He still get to the job sites.  He hates being on so many medicines but does take them  He is no longer on Eliquis and both he (and his wife via telephone) report that a doctor told him to that al he needed to stay on was the Plavix.  He also adds that he does not want to go back on it.   RCRI score is 2 (6.6%)  or 3 if include his CM (though clinically not noted to have CHF) would make 11% risk   AF/SVT/WPW Hx REMOTELY was followed at [facility] since he was 50 years old.  He reported having had a total of  5 or 6 ablations since that time.  The first being around the age of 31 for what he remembers is WPW. He is unsure of the rhythms that were ablated subsequent to that.   Historically reported being on propafenone and flecainide both failed He was on Multaq in 04/2014 when he developed tachy-palpitations, chest tightness and shortness of breath.  Tele noted atrial fibrillation with RVR, ventricular rates up to 200 as well as a short RP tachycardia with a cycle length of about .    Dec 2016 recurrent rapid AFib started on a/c, Multaq stopped and started on amiodarone   Past Medical History:  Diagnosis Date   Anxiety    Asthma     Atrial arrhythmia    multiples with 5-6 ablations   Atrial fibrillation with rapid ventricular response (HCC)    With rates up to 200 as well as short PR tachycardia, cycle length of 240 ms.   Chest tightness 05/08/14   Diabetes mellitus, type II (HCC)    GERD (gastroesophageal reflux disease)    Morbid obesity (HCC)    NSTEMI (non-ST elevated myocardial infarction) (HCC)    Palpitations 05/08/14   Pancreatitis    Pneumonia ~ 2010   Snores    Wil occasional apnea. Had a remote sleep study but does not remember results.   SOB (shortness of breath) 05/08/14   SVT (supraventricular tachycardia) (HCC)    WPW (Wolff-Parkinson-White syndrome)     Past Surgical History:  Procedure Laterality Date   ATRIAL FIBRILLATION ABLATION     "& WPW"   CARDIAC CATHETERIZATION     "6 or 7" (05/18/2016)   CORONARY STENT INTERVENTION N/A 11/15/2019   Procedure: CORONARY STENT INTERVENTION;  Surgeon: Kathleene Hazel, MD;  Location: [facility] INVASIVE CV LAB;  Service: Cardiovascular;  Laterality: N/A;   LEFT HEART CATH AND CORONARY ANGIOGRAPHY N/A 11/15/2019   Procedure: LEFT HEART CATH AND CORONARY ANGIOGRAPHY;  Surgeon: Kathleene Hazel, MD;  Location: [facility] INVASIVE CV LAB;  Service: Cardiovascular;  Laterality: N/A;   WRIST FRACTURE SURGERY Right 1990s    Current Outpatient Medications  Medication Sig Dispense Refill   alprazolam (XANAX) 2 MG tablet Take 2 mg by mouth 3 (three) times daily.     amiodarone (PACERONE) 200 MG tablet TAKE 1 TABLET ONCE DAILY. 30 tablet 11   atorvastatin (LIPITOR) 80 MG tablet Take 1 tablet (80 mg total) by mouth daily at 6 PM. 90 tablet 1   bisoprolol (ZEBETA) 5 MG tablet TAKE 1 TABLET ONCE DAILY. 90 tablet 1   budesonide-formoterol (SYMBICORT) 160-4.5 MCG/ACT inhaler Inhale 2 puffs into the lungs daily as needed (shortness of breath).      cetirizine (ZYRTEC) 10 MG tablet Take 10 mg by mouth daily.     clopidogrel (PLAVIX) 75 MG tablet Take 75 mg by mouth daily.      diltiazem (CARDIZEM CD) 240 MG 24 hr capsule Take 1 capsule (240 mg total) by mouth daily. 90 capsule 1   gabapentin (NEURONTIN) 300 MG capsule Take by mouth. Start 3 pills QHS for 3 days, then increase to 1 in the AM and 3 pills QHS for 3 days, then take 1 in AM, 1 at lunch and 3 pills QHS for 3 days, then take 2 in the AM, 1 at lunch and 3 pills QHS for 3 days, then take 2 pills in AM, 2 pills at lunch and 3 pills QHS for 3 days, then take 2 pills in the AM, 2 pills at  lunch and 4 pills QHS and continue.     HYDROcodone-acetaminophen (NORCO) 10-325 MG tablet Take 1 tablet by mouth every 8 (eight) hours as needed for up to 25 doses for severe pain. 25 tablet 0   isosorbide mononitrate (IMDUR) 60 MG 24 hr tablet Take 1 tablet (60 mg total) by mouth daily. 90 tablet 1   lactulose (CHRONULAC) 10 GM/15ML solution Take 15 mLs by mouth 2 (two) times daily.     levalbuterol (XOPENEX HFA) 45 MCG/ACT inhaler Inhale 1-2 puffs into the lungs every 6 (six) hours as needed for wheezing or shortness of breath.     levalbuterol (XOPENEX) 1.25 MG/3ML nebulizer solution Take 1.25 mg by nebulization every 6 (six) hours as needed.     metFORMIN (GLUCOPHAGE) 1000 MG tablet Take 1 tablet by mouth 2 (two) times daily.     montelukast (SINGULAIR) 10 MG tablet Take 10 mg by mouth every morning.      multivitamin-iron-minerals-folic acid (CENTRUM) chewable tablet Chew 1 tablet by mouth daily.     nitroGLYCERIN (NITROSTAT) 0.4 MG SL tablet Place 1 tablet (0.4 mg total) under the tongue every 5 (five) minutes x 3 doses as needed for chest pain. 25 tablet 2   ondansetron (ZOFRAN ODT) 8 MG disintegrating tablet Take 1 tablet (8 mg total) by mouth every 8 (eight) hours as needed for nausea. 20 tablet 0   oxyCODONE-acetaminophen (PERCOCET) 10-325 MG tablet Take 1 tablet by mouth every 6 (six) hours as needed for pain. 15 tablet 0   pantoprazole (PROTONIX) 40 MG tablet Take 40 mg by mouth daily.     sucralfate (CARAFATE) 1 g  tablet Take 1 tablet (1 g total) by mouth 4 (four) times daily -  with meals and at bedtime. 30 tablet 0   No current facility-administered medications for this visit.    Allergies:   Prednisone   Social History:  The patient  reports that he has been smoking cigarettes. He has a 30.00 pack-year smoking history. He has never used smokeless tobacco. He reports that he does not drink alcohol and does not use drugs.   Family History:  The patient's family history includes Clotting disorder in his father; Diabetes in his father; Heart attack (age of onset: 2) in his father; Hypertension in his father.  ROS:  Please see the history of present illness.    All other systems are reviewed and otherwise negative.   PHYSICAL EXAM:  VS:  BP 104/60   Pulse 62   Ht  (1.981 m)   Wt 221 lb 9.6 oz (100.5 kg)   SpO2 96%   BMI 25.61 kg/m  BMI: Body mass index is 25.61 kg/m. Well nourished, well developed, in no acute distress HEENT: normocephalic, atraumatic Neck: no JVD, carotid bruits or masses Cardiac:  RRR; no significant murmurs, no rubs, or gallops Lungs:  CTA b/l, no wheezing, rhonchi or rales Abd: soft, nontender MS: no obvious deformity or atrophy Ext: no edema Skin: warm and dry, no rash Neuro:  No gross deficits appreciated Psych: euthymic mood, full affect    EKG:  Done today and reviewed by myself shows  SR 62bpm, no ST/T changes   11/15/19: TTE IMPRESSIONS   1. Left ventricular ejection fraction, by estimation, is 45 to 50%. The  left ventricle has mildly decreased function. The left ventricle  demonstrates regional wall motion abnormalities (see scoring  diagram/findings for description). Left ventricular  diastolic parameters are indeterminate. There is mild hypokinesis of the  left ventricular, basal-mid anteroseptal wall and anterior segment. There  is mild hypokinesis of the left ventricular, mid-apical inferoseptal wall.   2. Right ventricular systolic function  is low normal. The right  ventricular size is normal.   3. The mitral valve is normal in structure. Trivial mitral valve  regurgitation. No evidence of mitral stenosis.   4. The aortic valve was not well visualized. Aortic valve regurgitation  is not visualized. No aortic stenosis is present.   5. The inferior vena cava is normal in size with <50% respiratory  variability, suggesting right atrial pressure of 8 mmHg.    11/15/2019: LHC/PCI Prox RCA lesion is 40% stenosed. Dist RCA lesion is 99% stenosed. Prox LAD to Mid LAD lesion is 80% stenosed. A drug-eluting stent was successfully placed using a SYNERGY XD 3.50X16. Post intervention, there is a 0% residual stenosis. A drug-eluting stent was successfully placed using a SYNERGY XD 4.0X20. Post intervention, there is a 0% residual stenosis. The left ventricular ejection fraction is 50-55% by visual estimate. The left ventricular systolic function is normal. LV end diastolic pressure is normal. There is no mitral valve regurgitation.   1. Severe stenosis distal RCA. Successful PTCA/DES x 1 distal RCA 2. Severe stenosis proximal LAD. Successful PTCA/DES x 1 proximal LAD 3. Low normal LV systolic function.   Recommendations: DAPT with ASA and Plavix for one month. Since he is also no Eliquis, can stop ASA after one month and continue Plavix along with Eliquis. Resume Eliquis tomorrow if no bleeding from radial artery cath site.   03/02/2016: TTE Study Conclusions  - Left ventricle: The cavity size was normal. There was moderate    focal basal and mild concentric hypertrophy. Systolic function    was normal. The estimated ejection fraction was in the range of    50% to 55%. The transmitral flow pattern was normal. Left    ventricular diastolic function parameters were normal.  - Mitral valve: Calcified annulus.  - Right ventricle: The cavity size was mildly dilated. Wall    thickness was normal.     Recent Labs: 01/20/2021: ALT  26; BUN 11; Creatinine, Ser 0.78; Hemoglobin 14.6; Platelets 326; Potassium 4.2; Sodium 135  No results found for requested labs within last 8760 hours.   CrCl cannot be calculated (Patient's most recent lab result is older than the maximum 21 days allowed.).   Wt Readings from Last 3 Encounters:  02/10/21 221 lb 9.6 oz (100.5 kg)  01/20/21 250 lb (113.4 kg)  01/10/21 250 lb (113.4 kg)     Other studies reviewed: Additional studies/records reviewed today include: summarized above  ASSESSMENT AND PLAN:  Paroxysmal AF SVT WPW CHA2DS2Vasc is 4 He denies any kind of cardiac awareness On amiodarone No pre excitation on his EKG  Recently  LFTs are OK, no TSH for a year, he reports his PMD is always doing labs and will ask them to forward to [place]  We discussed the different roles of plavix and Eliquis Discussed his risk of stroke associated with Eliquis  I told him we could wait to start after his hip surgery was completed but he does not want to He (and his wife via telephone) are firm that a doctor told him to stop it that he no longer needed it, to stay only on the Plavix.   I discussed with him my recommendations are different though he is very firm that he does not want to resume the blood thinner will continue the only plavix  after his hip surgery    4. 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 ICM Has never had clinical CHF No symptoms or exam findings of volume OL  6. Noncompliance with f/u Discussed the importance of regular cardiac follow up  7. Pre-op No exertional  intolerances or cardiac symptoms, no CP leading up to the time his hip got so bad I do not think he needs any new cardiac w/u prior to hip surgery He does have moderate cardiac surgical risk and he understands this, though no contraindication to hip surgery as planned Resume plavix post op when cleared by his orthopedic surgeon He is hoping to get the other hip done once recovered from the 1st    Disposition: F/u with us in 68mo, sooner if needed   Current medicines are reviewed at length with the patient today.  The patient did not have any concerns regarding medicines.  Norma FredricksonSigned, Shirlena Brinegar, PA-C 02/10/2021 4:37 PM     [facility] [address] [place] [id number] [phone] (office)  [phone] (fax)

## 2021-02-10 ENCOUNTER — Ambulatory Visit (INDEPENDENT_AMBULATORY_CARE_PROVIDER_SITE_OTHER): Payer: Commercial Managed Care - PPO | Admitting: Physician Assistant

## 2021-02-10 ENCOUNTER — Other Ambulatory Visit: Payer: Self-pay

## 2021-02-10 ENCOUNTER — Encounter: Payer: Self-pay | Admitting: Physician Assistant

## 2021-02-10 VITALS — BP 104/60 | HR 62 | Ht 78.0 in | Wt 221.6 lb

## 2021-02-10 DIAGNOSIS — I480 Paroxysmal atrial fibrillation: Secondary | ICD-10-CM | POA: Diagnosis not present

## 2021-02-10 DIAGNOSIS — I471 Supraventricular tachycardia, unspecified: Secondary | ICD-10-CM

## 2021-02-10 DIAGNOSIS — I255 Ischemic cardiomyopathy: Secondary | ICD-10-CM

## 2021-02-10 DIAGNOSIS — I456 Pre-excitation syndrome: Secondary | ICD-10-CM

## 2021-02-10 DIAGNOSIS — Z01818 Encounter for other preprocedural examination: Secondary | ICD-10-CM | POA: Diagnosis not present

## 2021-02-10 DIAGNOSIS — I2510 Atherosclerotic heart disease of native coronary artery without angina pectoris: Secondary | ICD-10-CM

## 2021-02-10 NOTE — Patient Instructions (Signed)
Medication Instructions:   Your physician recommends that you continue on your current medications as directed. Please refer to the Current Medication list given to you today.'  *If you need a refill on your cardiac medications before your next appointment, please call your pharmacy*   Lab Work: NONE ORDERED  TODAY   If you have labs (blood work) drawn today and your tests are completely normal, you will receive your results only by: . MyChart Message (if you have MyChart) OR . A paper copy in the mail If you have any lab test that is abnormal or we need to change your treatment, we will call you to review the results.   Testing/Procedures: NONE ORDERED  TODAY    Follow-Up: At [facility], you and your health needs are our priority.  As part of our continuing mission to provide you with exceptional heart care, we have created designated Provider Care Teams.  These Care Teams include your primary Cardiologist (physician) and Advanced Practice Providers (APPs -  Physician Assistants and Nurse Practitioners) who all work together to provide you with the care you need, when you need it.  We recommend signing up for the patient portal called "MyChart".  Sign up information is provided on this After Visit Summary.  MyChart is used to connect with patients for Virtual Visits (Telemedicine).  Patients are able to view lab/test results, encounter notes, upcoming appointments, etc.  Non-urgent messages can be sent to your provider as well.   To learn more about what you can do with MyChart, go to [email or web address].    Your next appointment:   6 month(s)  The format for your next appointment:   In Person  Provider:   Gregg Taylor, MD   Other Instructions  

## 2021-02-11 NOTE — Progress Notes (Signed)
Surgical Instructions    Your procedure is scheduled on July 6th Wednesday.  Report to [facility] Main Entrance "A" at 10:30 A.M., then check in with the Admitting office.  Call this number if you have problems the morning of surgery:  [phone]   If you have any questions prior to your surgery date call [phone]: Open Monday-Friday 8am-4pm    Remember:  Do not eat after midnight the night before your surgery  You may drink clear liquids until 9:30am the morning of your surgery.   Clear liquids allowed are: Water, Non-Citrus Juices (without pulp), Carbonated Beverages, Clear Tea, Black Coffee Only, and Gatorade   Enhanced Recovery after Surgery for Orthopedics Enhanced Recovery after Surgery is a protocol used to improve the stress on your body and your recovery after surgery.  Patient Instructions   The day of surgery (if you have diabetes):  Drink ONE small 10 oz bottle of water by __9:30___ am the morning of surgery This bottle was given to you during your hospital  pre-op appointment visit.  Nothing else to drink after completing the  Small 10 oz bottle of water.         If you have questions, please contact your surgeon's office.     Take these medicines the morning of surgery with A SIP OF WATER  alprazolam (XANAX) 2 MG tablet amiodarone (PACERONE) 200 MG tablet bisoprolol (ZEBETA) 5 MG tablet cetirizine (ZYRTEC) 10 MG tablet diltiazem (CARDIZEM CD) 240 MG 24 hr capsule gabapentin (NEURONTIN) 300 MG capsule isosorbide mononitrate (IMDUR) 60 MG 24 hr tablet montelukast (SINGULAIR) 10 MG tablet pantoprazole (PROTONIX) 40 MG tablet  IF NEEDED: budesonide-formoterol (SYMBICORT) 160-4.5 MCG/ACT inhaler, please bring with you to the hospital HYDROcodone-acetaminophen (NORCO) 10-325 MG tablet levalbuterol (XOPENEX HFA) 45 MCG/ACT inhaler, please bring with you to the hospital levalbuterol (XOPENEX) 1.25 MG/3ML nebulizer solution nitroGLYCERIN (NITROSTAT) 0.4  MG SL tablet ondansetron (ZOFRAN ODT) 8 MG disintegrating tablet oxyCODONE-acetaminophen (PERCOCET) 10-325 MG tablet   WHAT DO I DO ABOUT MY DIABETES MEDICATION?   Do not take oral diabetes medicines (Metformin) the morning of surgery.   HOW TO MANAGE YOUR DIABETES BEFORE AND AFTER SURGERY  Why is it important to control my blood sugar before and after surgery? Improving blood sugar levels before and after surgery helps healing and can limit problems. A way of improving blood sugar control is eating a healthy diet by:  Eating less sugar and carbohydrates  Increasing activity/exercise  Talking with your doctor about reaching your blood sugar goals High blood sugars (greater than 180 mg/dL) can raise your risk of infections and slow your recovery, so you will need to focus on controlling your diabetes during the weeks before surgery. Make sure that the doctor who takes care of your diabetes knows about your planned surgery including the date and location.  How do I manage my blood sugar before surgery? Check your blood sugar at least 4 times a day, starting 2 days before surgery, to make sure that the level is not too high or low.  Check your blood sugar the morning of your surgery when you wake up and every 2 hours until you get to the Short Stay unit.  If your blood sugar is less than 70 mg/dL, you will need to treat for low blood sugar: Do not take insulin. Treat a low blood sugar (less than 70 mg/dL) with  cup of clear juice (cranberry or apple), 4 glucose tablets, OR glucose gel. Recheck blood sugar in  15 minutes after treatment (to make sure it is greater than 70 mg/dL). If your blood sugar is not greater than 70 mg/dL on recheck, call [phone] for further instructions. Report your blood sugar to the short stay nurse when you get to Short Stay.  If you are admitted to the hospital after surgery: Your blood sugar will be checked by the staff and you will probably be given  insulin after surgery (instead of oral diabetes medicines) to make sure you have good blood sugar levels. The goal for blood sugar control after surgery is 80-180 mg/dL.  As of today, STOP taking any Aspirin (unless otherwise instructed by your surgeon) Aleve, Naproxen, Ibuprofen, Motrin, Advil, Goody's, BC's, all herbal medications, fish oil, and all vitamins.          Do not wear jewelry  Do not wear lotions, powders, colognes, or deodorant. Do not shave 48 hours prior to surgery.  Men may shave face and neck. Do not bring valuables to the hospital. DO Not wear nail polish, gel polish, artificial nails, or any other type of covering on  natural nails including finger and toenails. If patients have artificial nails, gel coating, etc. that need to be removed by a nail salon please have this removed prior to surgery or surgery may need to be canceled/delayed if the surgeon/ anesthesia feels like the patient is unable to be adequately monitored.             [facility] is not responsible for any belongings or valuables.  Do NOT Smoke (Tobacco/Vaping) or drink Alcohol 24 hours prior to your procedure If you use a CPAP at night, you may bring all equipment for your overnight stay.   Contacts, glasses, dentures or bridgework may not be worn into surgery, please bring cases for these belongings   For patients admitted to the hospital, discharge time will be determined by your treatment team.   Patients discharged the day of surgery will not be allowed to drive home, and someone needs to stay with them for 24 hours.  ONLY 1 SUPPORT PERSON MAY BE PRESENT WHILE YOU ARE IN SURGERY. IF YOU ARE TO BE ADMITTED ONCE YOU ARE IN YOUR ROOM YOU WILL BE ALLOWED TWO (2) VISITORS.  Minor children may have two parents present. Special consideration for safety and communication needs will be reviewed on a case by case basis.  Special instructions:    Oral Hygiene is also important to reduce your risk of  infection.  Remember - BRUSH YOUR TEETH THE MORNING OF SURGERY WITH YOUR REGULAR TOOTHPASTE   [facility]- Preparing For Surgery  Before surgery, you can play an important role. Because skin is not sterile, your skin needs to be as free of germs as possible. You can reduce the number of germs on your skin by washing with CHG (chlorahexidine gluconate) Soap before surgery.  CHG is an antiseptic cleaner which kills germs and bonds with the skin to continue killing germs even after washing.     Please do not use if you have an allergy to CHG or antibacterial soaps. If your skin becomes reddened/irritated stop using the CHG.  Do not shave (including legs and underarms) for at least 48 hours prior to first CHG shower. It is OK to shave your face.  Please follow these instructions carefully.     Shower the NIGHT BEFORE SURGERY and the MORNING OF SURGERY with CHG Soap.   If you chose to wash your hair, wash your hair  first as usual with your normal shampoo. After you shampoo, rinse your hair and body thoroughly to remove the shampoo.  Then [employer] and genitals (private parts) with your normal soap and rinse thoroughly to remove soap.  After that Use CHG Soap as you would any other liquid soap. You can apply CHG directly to the skin and wash gently with a scrungie or a clean washcloth.   Apply the CHG Soap to your body ONLY FROM THE NECK DOWN.  Do not use on open wounds or open sores. Avoid contact with your eyes, ears, mouth and genitals (private parts). Wash Face and genitals (private parts)  with your normal soap.   Wash thoroughly, paying special attention to the area where your surgery will be performed.  Thoroughly rinse your body with warm water from the neck down.  DO NOT shower/wash with your normal soap after using and rinsing off the CHG Soap.  Pat yourself dry with a CLEAN TOWEL.  Wear CLEAN PAJAMAS to bed the night before surgery  Place CLEAN SHEETS on your bed the night before  your surgery  DO NOT SLEEP WITH PETS.   Day of Surgery:  Take a shower with CHG soap. Wear Clean/Comfortable clothing the morning of surgery Do not apply any deodorants/lotions.   Remember to brush your teeth WITH YOUR REGULAR TOOTHPASTE.   Please read over the following fact sheets that you were given.

## 2021-02-12 ENCOUNTER — Encounter (HOSPITAL_COMMUNITY)
Admission: RE | Admit: 2021-02-12 | Discharge: 2021-02-12 | Disposition: A | Discharge status: Home / Self Care | Payer: Commercial Managed Care - PPO | Source: Ambulatory Visit | Attending: Orthopaedic Surgery | Admitting: Orthopaedic Surgery

## 2021-02-12 ENCOUNTER — Encounter (HOSPITAL_COMMUNITY): Payer: Self-pay

## 2021-02-12 ENCOUNTER — Other Ambulatory Visit: Payer: Self-pay

## 2021-02-12 ENCOUNTER — Encounter (HOSPITAL_COMMUNITY)
Admission: RE | Admit: 2021-02-12 | Discharge: 2021-02-12 | Disposition: A | Discharge status: Home / Self Care | Payer: Commercial Managed Care - PPO | Source: Ambulatory Visit | Attending: Physician Assistant | Admitting: Physician Assistant

## 2021-02-12 DIAGNOSIS — Z7982 Long term (current) use of aspirin: Secondary | ICD-10-CM | POA: Diagnosis not present

## 2021-02-12 DIAGNOSIS — Z7984 Long term (current) use of oral hypoglycemic drugs: Secondary | ICD-10-CM | POA: Diagnosis not present

## 2021-02-12 DIAGNOSIS — M1612 Unilateral primary osteoarthritis, left hip: Secondary | ICD-10-CM | POA: Diagnosis present

## 2021-02-12 DIAGNOSIS — F17210 Nicotine dependence, cigarettes, uncomplicated: Secondary | ICD-10-CM | POA: Diagnosis not present

## 2021-02-12 DIAGNOSIS — Z79899 Other long term (current) drug therapy: Secondary | ICD-10-CM | POA: Diagnosis not present

## 2021-02-12 DIAGNOSIS — Z01818 Encounter for other preprocedural examination: Secondary | ICD-10-CM | POA: Diagnosis present

## 2021-02-12 DIAGNOSIS — J45909 Unspecified asthma, uncomplicated: Secondary | ICD-10-CM | POA: Diagnosis not present

## 2021-02-12 DIAGNOSIS — Z7902 Long term (current) use of antithrombotics/antiplatelets: Secondary | ICD-10-CM | POA: Diagnosis not present

## 2021-02-12 DIAGNOSIS — E119 Type 2 diabetes mellitus without complications: Secondary | ICD-10-CM | POA: Diagnosis not present

## 2021-02-12 DIAGNOSIS — I4891 Unspecified atrial fibrillation: Secondary | ICD-10-CM | POA: Diagnosis not present

## 2021-02-12 DIAGNOSIS — M87052 Idiopathic aseptic necrosis of left femur: Secondary | ICD-10-CM

## 2021-02-12 HISTORY — DX: Cardiac arrhythmia, unspecified: I49.9

## 2021-02-12 LAB — COMPREHENSIVE METABOLIC PANEL
ALT: 26 U/L (ref 0–44)
AST: 26 U/L (ref 15–41)
Albumin: 4.2 g/dL (ref 3.5–5.0)
Alkaline Phosphatase: 110 U/L (ref 38–126)
Anion gap: 8 (ref 5–15)
BUN: 9 mg/dL (ref 6–20)
CO2: 26 mmol/L (ref 22–32)
Calcium: 9.4 mg/dL (ref 8.9–10.3)
Chloride: 100 mmol/L (ref 98–111)
Creatinine, Ser: 0.9 mg/dL (ref 0.61–1.24)
GFR, Estimated: >60 mL/min (ref >60)
Glucose, Bld: 96 mg/dL (ref 70–99)
Potassium: 4.5 mmol/L (ref 3.5–5.1)
Sodium: 134 mmol/L — ABNORMAL LOW (ref 135–145)
Total Bilirubin: 0.5 mg/dL (ref 0.3–1.2)
Total Protein: 7.7 g/dL (ref 6.5–8.1)

## 2021-02-12 LAB — CBC WITH DIFFERENTIAL/PLATELET
Abs Immature Granulocytes: 0.04 10*3/uL (ref 0.00–0.07)
Basophils Absolute: 0.1 10*3/uL (ref 0.0–0.1)
Basophils Relative: 1 %
Eosinophils Absolute: 0.2 10*3/uL (ref 0.0–0.5)
Eosinophils Relative: 2 %
HCT: 44.9 % (ref 39.0–52.0)
Hemoglobin: 14.8 g/dL (ref 13.0–17.0)
Immature Granulocytes: 0 %
Lymphocytes Relative: 21 %
Lymphs Abs: 2.1 10*3/uL (ref 0.7–4.0)
MCH: 30.8 pg (ref 26.0–34.0)
MCHC: 33 g/dL (ref 30.0–36.0)
MCV: 93.3 fL (ref 80.0–100.0)
Monocytes Absolute: 0.9 10*3/uL (ref 0.1–1.0)
Monocytes Relative: 9 %
Neutro Abs: 6.7 10*3/uL (ref 1.7–7.7)
Neutrophils Relative %: 67 %
Platelets: 372 10*3/uL (ref 150–400)
RBC: 4.81 MIL/uL (ref 4.22–5.81)
RDW: 14.2 % (ref 11.5–15.5)
WBC: 10 10*3/uL (ref 4.0–10.5)
nRBC: 0 % (ref 0.0–0.2)

## 2021-02-12 LAB — TYPE AND SCREEN
ABO/RH(D): O POS
Antibody Screen: NEGATIVE

## 2021-02-12 LAB — PROTIME-INR
INR: 1 (ref 0.8–1.2)
Prothrombin Time: 13.1 seconds (ref 11.4–15.2)

## 2021-02-12 LAB — URINALYSIS, ROUTINE W REFLEX MICROSCOPIC
Bilirubin Urine: NEGATIVE
Glucose, UA: NEGATIVE mg/dL
Hgb urine dipstick: NEGATIVE
Ketones, ur: NEGATIVE mg/dL
Leukocytes,Ua: NEGATIVE
Nitrite: NEGATIVE
Protein, ur: NEGATIVE mg/dL
Specific Gravity, Urine: <1.005 — ABNORMAL LOW (ref 1.005–1.030)
pH: 6.5 (ref 5.0–8.0)

## 2021-02-12 LAB — APTT: aPTT: 37 seconds — ABNORMAL HIGH (ref 24–36)

## 2021-02-12 LAB — SURGICAL PCR SCREEN
MRSA, PCR: NEGATIVE
Staphylococcus aureus: NEGATIVE

## 2021-02-12 LAB — GLUCOSE, CAPILLARY: Glucose-Capillary: 91 mg/dL (ref 70–99)

## 2021-02-12 NOTE — Progress Notes (Signed)
PCP - Lindajo Royal, MD Cardiologist - Dr. Ladona Ridgel  Chest x-ray - 02-12-21 EKG - 02-10-21 Stress Test - "years ago" ECHO - 11-15-19 Cardiac Cath - 11-15-19  Sleep Study - no OSA  DM Type 2 CBG at PAT appt: 91 Fasting Blood Sugar - 110-115 Checks Blood Sugar every other day  Blood Thinner Instructions: Plavix last dose > 2 weeks  ERAS Protcol - yes PRE-SURGERY G2  COVID TEST- 02-12-21   Anesthesia review: yes. Cardiac history  Patient denies shortness of breath, fever, cough and chest pain at PAT appointment   All instructions explained to the patient, with a verbal understanding of the material. Patient agrees to go over the instructions while at home for a better understanding. Patient also instructed to wear mask in public after being tested for COVID-19. The opportunity to ask questions was provided.

## 2021-02-13 NOTE — Progress Notes (Signed)
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  Last seen by cardiology 02/10/2021 for preop evaluation.  Per note, "Pre-op. No exertional intolerances or cardiac symptoms, no CP leading up to the time his hip got so bad. I do not think he needs any new cardiac w/u prior to hip surgery. He does have moderate cardiac surgical risk and he understands this, though no contraindication to hip surgery as planned. Resume plavix post op when cleared by his orthopedic surgeon. He is hoping to get the other hip done once recovered from the 1st."  Current smoker, 30 pack year history. Hx of COPD, maintained on symbicort.   Covid positive 12/31/20.  Preop labs reviewed, unremarkable. DMII well controlled, A1c 6.6.  CHEST - 2 VIEW 02/12/2021: COMPARISON:  12/31/2020   FINDINGS: Cardiomediastinal silhouette unchanged in size and contour. No evidence of central vascular congestion. No interlobular septal thickening.   Evidence of prior PTC I   No pneumothorax or pleural effusion. Coarsened interstitial markings, with no confluent airspace disease.   No acute displaced fracture. Degenerative changes of the spine.   IMPRESSION: Negative for acute cardiopulmonary disease.   Evidence of prior PTC I  TTE 11/15/2019:  1. Left ventricular ejection fraction, by estimation, is 45 to 50%. The  left ventricle has mildly decreased function. The left ventricle  demonstrates regional wall motion abnormalities (see scoring  diagram/findings for description). Left ventricular  diastolic parameters are indeterminate. There is mild hypokinesis of the  left ventricular, basal-mid anteroseptal wall and anterior segment. There  is mild hypokinesis of the left ventricular, mid-apical inferoseptal wall.   2.  Right ventricular systolic function is low normal. The right  ventricular size is normal.   3. The mitral valve is normal in structure. Trivial mitral valve  regurgitation. No evidence of mitral stenosis.   4. The aortic valve was not well visualized. Aortic valve regurgitation  is not visualized. No aortic stenosis is present.   5. The inferior vena cava is normal in size with <50% respiratory  variability, suggesting right atrial pressure of 8 mmHg.   PCI 11/15/2019: 1. Severe stenosis distal RCA. Successful PTCA/DES x 1 distal RCA 2. Severe stenosis proximal LAD. Successful PTCA/DES x 1 proximal LAD 3. Low normal LV systolic function.   Recommendations: DAPT with ASA and Plavix for one month. Since he is also no Eliquis, can stop ASA after one month and continue Plavix along with Eliquis. Resume Eliquis tomorrow if no bleeding from radial artery cath site.   James Burns, PA-C [facility]/Anesthesiology Phone [phone] 02/14/2021 9:08 AM    

## 2021-02-14 LAB — HEMOGLOBIN A1C
Hgb A1c MFr Bld: 6.6 % — ABNORMAL HIGH (ref 4.8–5.6)
Mean Plasma Glucose: 143 mg/dL

## 2021-02-14 NOTE — Anesthesia Preprocedure Evaluation (Addendum)
Anesthesia Evaluation  Patient identified by MRN, date of birth, ID band Patient awake    Reviewed: Allergy & Precautions, NPO status , Patient's Chart, lab work & pertinent test results  Airway Mallampati: II  TM Distance: >3 FB Neck ROM: Full    Dental  (+) Dental Advisory Given   Pulmonary asthma , COPD, Current Smoker and Patient abstained from smoking.,    breath sounds clear to auscultation       Cardiovascular hypertension, Pt. on medications and Pt. on home beta blockers + CAD, + Past MI and + Cardiac Stents  + dysrhythmias Atrial Fibrillation  Rhythm:Regular Rate:Normal     Neuro/Psych CVA    GI/Hepatic Neg liver ROS, GERD  ,  Endo/Other  diabetes, Type 2  Renal/GU negative Renal ROS     Musculoskeletal   Abdominal   Peds  Hematology negative hematology ROS (+)   Anesthesia Other Findings   Reproductive/Obstetrics                            Lab Results  Component Value Date   WBC 10.0 02/12/2021   HGB 14.8 02/12/2021   HCT 44.9 02/12/2021   MCV 93.3 02/12/2021   PLT 372 02/12/2021   Lab Results  Component Value Date   CREATININE 0.90 02/12/2021   BUN 9 02/12/2021   NA 134 (L) 02/12/2021   K 4.5 02/12/2021   CL 100 02/12/2021   CO2 26 02/12/2021    Anesthesia Physical Anesthesia Plan  ASA: 3  Anesthesia Plan: Spinal   Post-op Pain Management:    Induction:   PONV Risk Score and Plan: 1 and Propofol infusion, Ondansetron and Treatment may vary due to age or medical condition  Airway Management Planned: Simple Face Mask and Natural Airway  Additional Equipment:   Intra-op Plan:   Post-operative Plan:   Informed Consent: I have reviewed the patients History and Physical, chart, labs and discussed the procedure including the risks, benefits and alternatives for the proposed anesthesia with the patient or authorized representative who has indicated his/her  understanding and acceptance.       Plan Discussed with: CRNA  Anesthesia Plan Comments: (PAT note by Karoline Caldwell, PA-C:  Pertinent history includes AVN of hips, chronic pancreatitis, DMII, WPW s/p multiple ablation, SVT, AFIB (on amiodarone, declined anticoagulation), stroke, hx of ETOH abuse (in remission by notes), CAD (PCI3/30/2021), COPD, ICM.  Last seen by cardiology 02/10/2021 for preop evaluation.  Per note, "Pre-op. No exertional intolerances or cardiac symptoms, no CP leading up to the time his hip got so bad. I do not think he needs any new cardiac w/u prior to hip surgery. He does have moderate cardiac surgical risk and he understands this, though no contraindication to hip surgery as planned. Resume plavix post op when cleared by his orthopedic surgeon. He is hoping to get the other hip done once recovered from the 1st."  Current smoker, 30 pack year history. Hx of COPD, maintained on symbicort.   Covid positive 12/31/20.  Preop labs reviewed, unremarkable. DMII well controlled, A1c 6.6.  CHEST - 2 VIEW 02/12/2021: COMPARISON: 12/31/2020  FINDINGS: Cardiomediastinal silhouette unchanged in size and contour. No evidence of central vascular congestion. No interlobular septal thickening.  Evidence of prior PTC I  No pneumothorax or pleural effusion. Coarsened interstitial markings, with no confluent airspace disease.  No acute displaced fracture. Degenerative changes of the spine.  IMPRESSION: Negative for acute cardiopulmonary disease.  Evidence  of prior PTC I  TTE 11/15/2019: 1. Left ventricular ejection fraction, by estimation, is 45 to 50%. The  left ventricle has mildly decreased function. The left ventricle  demonstrates regional wall motion abnormalities (see scoring  diagram/findings for description). Left ventricular  diastolic parameters are indeterminate. There is mild hypokinesis of the  left ventricular, basal-mid anteroseptal wall and  anterior segment. There  is mild hypokinesis of the left ventricular, mid-apical inferoseptal wall.  2. Right ventricular systolic function is low normal. The right  ventricular size is normal.  3. The mitral valve is normal in structure. Trivial mitral valve  regurgitation. No evidence of mitral stenosis.  4. The aortic valve was not well visualized. Aortic valve regurgitation  is not visualized. No aortic stenosis is present.  5. The inferior vena cava is normal in size with <50% respiratory  variability, suggesting right atrial pressure of 8 mmHg.   PCI 11/15/2019: 1. Severe stenosis distal RCA. Successful PTCA/DES x 1 distal RCA 2. Severe stenosis proximal LAD. Successful PTCA/DES x 1 proximal LAD 3. Low normal LV systolic function.  Recommendations: DAPT with ASA and Plavix for one month. Since he is also no Eliquis, can stop ASA after one month and continue Plavix along with Eliquis. Resume Eliquis tomorrow if no bleeding from radial artery cath site. )       Anesthesia Quick Evaluation

## 2021-02-18 ENCOUNTER — Other Ambulatory Visit (HOSPITAL_COMMUNITY)
Admission: RE | Admit: 2021-02-18 | Discharge: 2021-02-18 | Disposition: A | Discharge status: Home / Self Care | Payer: Commercial Managed Care - PPO | Source: Ambulatory Visit | Attending: Orthopaedic Surgery | Admitting: Orthopaedic Surgery

## 2021-02-18 DIAGNOSIS — Z01812 Encounter for preprocedural laboratory examination: Secondary | ICD-10-CM | POA: Diagnosis present

## 2021-02-18 DIAGNOSIS — Z20822 Contact with and (suspected) exposure to covid-19: Secondary | ICD-10-CM | POA: Insufficient documentation

## 2021-02-18 MED ORDER — TRANEXAMIC ACID 1000 MG/10ML IV SOLN
2000.0000 mg | INTRAVENOUS | Status: DC
  Filled 2021-02-18 (×2): qty 20

## 2021-02-18 NOTE — Telephone Encounter (Signed)
Clearance request is in the pt's chart. The pt had an appt with Francis Dowse, [facility] 02/10/21 for pre op clearance. I will ask pre op provider to please review Francis Dowse, [facility] note for clearance. Pt surgery is set for tomorrow 7/6.

## 2021-02-19 ENCOUNTER — Encounter (HOSPITAL_COMMUNITY)
Admission: RE | Disposition: A | Discharge status: Home / Self Care | Payer: Self-pay | Source: Home / Self Care | Attending: Orthopaedic Surgery

## 2021-02-19 ENCOUNTER — Observation Stay (HOSPITAL_COMMUNITY): Payer: Commercial Managed Care - PPO

## 2021-02-19 ENCOUNTER — Encounter (HOSPITAL_COMMUNITY): Payer: Self-pay | Admitting: Orthopaedic Surgery

## 2021-02-19 ENCOUNTER — Ambulatory Visit (HOSPITAL_COMMUNITY): Payer: Commercial Managed Care - PPO | Admitting: Physician Assistant

## 2021-02-19 ENCOUNTER — Other Ambulatory Visit: Payer: Self-pay

## 2021-02-19 ENCOUNTER — Ambulatory Visit (HOSPITAL_COMMUNITY): Payer: Commercial Managed Care - PPO

## 2021-02-19 ENCOUNTER — Other Ambulatory Visit: Payer: Self-pay | Admitting: Physician Assistant

## 2021-02-19 ENCOUNTER — Observation Stay (HOSPITAL_COMMUNITY)
Admission: RE | Admit: 2021-02-19 | Discharge: 2021-02-20 | Disposition: A | Discharge status: Home / Self Care | Payer: Commercial Managed Care - PPO | Attending: Orthopaedic Surgery | Admitting: Orthopaedic Surgery

## 2021-02-19 DIAGNOSIS — M1612 Unilateral primary osteoarthritis, left hip: Principal | ICD-10-CM | POA: Insufficient documentation

## 2021-02-19 DIAGNOSIS — M87052 Idiopathic aseptic necrosis of left femur: Secondary | ICD-10-CM

## 2021-02-19 DIAGNOSIS — Z96642 Presence of left artificial hip joint: Secondary | ICD-10-CM

## 2021-02-19 DIAGNOSIS — Z7982 Long term (current) use of aspirin: Secondary | ICD-10-CM | POA: Insufficient documentation

## 2021-02-19 DIAGNOSIS — I4891 Unspecified atrial fibrillation: Secondary | ICD-10-CM | POA: Insufficient documentation

## 2021-02-19 DIAGNOSIS — J45909 Unspecified asthma, uncomplicated: Secondary | ICD-10-CM | POA: Insufficient documentation

## 2021-02-19 DIAGNOSIS — M87059 Idiopathic aseptic necrosis of unspecified femur: Secondary | ICD-10-CM | POA: Diagnosis present

## 2021-02-19 DIAGNOSIS — Z7984 Long term (current) use of oral hypoglycemic drugs: Secondary | ICD-10-CM | POA: Insufficient documentation

## 2021-02-19 DIAGNOSIS — Z79899 Other long term (current) drug therapy: Secondary | ICD-10-CM | POA: Insufficient documentation

## 2021-02-19 DIAGNOSIS — Z419 Encounter for procedure for purposes other than remedying health state, unspecified: Secondary | ICD-10-CM

## 2021-02-19 DIAGNOSIS — Z96649 Presence of unspecified artificial hip joint: Secondary | ICD-10-CM

## 2021-02-19 DIAGNOSIS — E119 Type 2 diabetes mellitus without complications: Secondary | ICD-10-CM | POA: Insufficient documentation

## 2021-02-19 DIAGNOSIS — Z7902 Long term (current) use of antithrombotics/antiplatelets: Secondary | ICD-10-CM | POA: Insufficient documentation

## 2021-02-19 DIAGNOSIS — F17210 Nicotine dependence, cigarettes, uncomplicated: Secondary | ICD-10-CM | POA: Insufficient documentation

## 2021-02-19 HISTORY — PX: TOTAL HIP ARTHROPLASTY: SHX124

## 2021-02-19 LAB — RAPID HIV SCREEN (HIV 1/2 AB+AG)
HIV 1/2 Antibodies: NONREACTIVE
HIV-1 P24 Antigen - HIV24: NONREACTIVE

## 2021-02-19 LAB — GLUCOSE, CAPILLARY
Glucose-Capillary: 107 mg/dL — ABNORMAL HIGH (ref 70–99)
Glucose-Capillary: 96 mg/dL (ref 70–99)
Glucose-Capillary: 124 mg/dL — ABNORMAL HIGH (ref 70–99)
Glucose-Capillary: 263 mg/dL — ABNORMAL HIGH (ref 70–99)

## 2021-02-19 LAB — SARS CORONAVIRUS 2 (TAT 6-24 HRS): SARS Coronavirus 2: NEGATIVE

## 2021-02-19 LAB — ABO/RH: ABO/RH(D): O POS

## 2021-02-19 SURGERY — ARTHROPLASTY, HIP, TOTAL, ANTERIOR APPROACH
Anesthesia: Spinal | Site: Hip | Laterality: Left

## 2021-02-19 MED ORDER — NICOTINE 21 MG/24HR TD PT24
21.0000 mg | MEDICATED_PATCH | Freq: Every day | TRANSDERMAL | Status: DC
  Administered 2021-02-19 – 2021-02-20 (×2): 21 mg via TRANSDERMAL
  Filled 2021-02-19 (×2): qty 1

## 2021-02-19 MED ORDER — METHOCARBAMOL 1000 MG/10ML IJ SOLN
500.0000 mg | Freq: Four times a day (QID) | INTRAVENOUS | Status: DC | PRN
  Filled 2021-02-19: qty 5

## 2021-02-19 MED ORDER — DIPHENHYDRAMINE HCL 12.5 MG/5ML PO ELIX
25.0000 mg | ORAL_SOLUTION | ORAL | Status: DC | PRN
  Filled 2021-02-19: qty 10

## 2021-02-19 MED ORDER — MIDAZOLAM HCL 5 MG/5ML IJ SOLN
INTRAMUSCULAR | Status: DC | PRN
  Administered 2021-02-19: 2 mg via INTRAVENOUS

## 2021-02-19 MED ORDER — METFORMIN HCL 500 MG PO TABS
1000.0000 mg | ORAL_TABLET | Freq: Two times a day (BID) | ORAL | Status: DC
  Administered 2021-02-19 – 2021-02-20 (×2): 1000 mg via ORAL
  Filled 2021-02-19 (×2): qty 2

## 2021-02-19 MED ORDER — INSULIN ASPART 100 UNIT/ML IJ SOLN
0.0000 [IU] | Freq: Three times a day (TID) | INTRAMUSCULAR | Status: DC
Start: 2021-02-20 — End: 2021-02-20

## 2021-02-19 MED ORDER — TRANEXAMIC ACID-NACL 1000-0.7 MG/100ML-% IV SOLN
1000.0000 mg | INTRAVENOUS | Status: AC
  Administered 2021-02-19: 1000 mg via INTRAVENOUS

## 2021-02-19 MED ORDER — 0.9 % SODIUM CHLORIDE (POUR BTL) OPTIME
TOPICAL | Status: DC | PRN
  Administered 2021-02-19: 1000 mL

## 2021-02-19 MED ORDER — IRRISEPT - 450ML BOTTLE WITH 0.05% CHG IN STERILE WATER, USP 99.95% OPTIME
TOPICAL | Status: DC | PRN
  Administered 2021-02-19: 450 mL

## 2021-02-19 MED ORDER — OXYCODONE HCL 5 MG PO TABS
ORAL_TABLET | ORAL | Status: AC
  Administered 2021-02-19: 10 mg via ORAL
  Filled 2021-02-19: qty 1

## 2021-02-19 MED ORDER — PROPOFOL 10 MG/ML IV BOLUS
INTRAVENOUS | Status: DC | PRN
  Administered 2021-02-19: 20 mg via INTRAVENOUS
  Administered 2021-02-19 (×2): 30 mg via INTRAVENOUS
  Administered 2021-02-19: 40 mg via INTRAVENOUS
  Administered 2021-02-19: 20 mg via INTRAVENOUS
  Administered 2021-02-19 (×2): 30 mg via INTRAVENOUS

## 2021-02-19 MED ORDER — CEFAZOLIN SODIUM-DEXTROSE 2-4 GM/100ML-% IV SOLN
2.0000 g | Freq: Four times a day (QID) | INTRAVENOUS | Status: AC
  Administered 2021-02-19 – 2021-02-20 (×2): 2 g via INTRAVENOUS
  Filled 2021-02-19 (×2): qty 100

## 2021-02-19 MED ORDER — METOCLOPRAMIDE HCL 5 MG/ML IJ SOLN: 5.0000 mg | Freq: Three times a day (TID) | INTRAMUSCULAR | Status: DC | PRN

## 2021-02-19 MED ORDER — ONDANSETRON HCL 4 MG/2ML IJ SOLN: 4.0000 mg | Freq: Four times a day (QID) | INTRAMUSCULAR | Status: DC | PRN

## 2021-02-19 MED ORDER — GABAPENTIN 300 MG PO CAPS
900.0000 mg | ORAL_CAPSULE | Freq: Every day | ORAL | Status: DC
  Filled 2021-02-19: qty 3

## 2021-02-19 MED ORDER — OXYCODONE HCL 5 MG PO TABS
5.0000 mg | ORAL_TABLET | ORAL | Status: DC | PRN
  Administered 2021-02-19: 10 mg via ORAL
  Filled 2021-02-19: qty 2

## 2021-02-19 MED ORDER — ONDANSETRON HCL 4 MG PO TABS: 4.0000 mg | ORAL_TABLET | Freq: Four times a day (QID) | ORAL | Status: DC | PRN

## 2021-02-19 MED ORDER — PHENOL 1.4 % MT LIQD: 1.0000 | OROMUCOSAL | Status: DC | PRN

## 2021-02-19 MED ORDER — TRANEXAMIC ACID 1000 MG/10ML IV SOLN
INTRAVENOUS | Status: DC | PRN
  Administered 2021-02-19: 2000 mg via TOPICAL

## 2021-02-19 MED ORDER — SODIUM CHLORIDE 0.9 % IV SOLN: INTRAVENOUS | Status: DC

## 2021-02-19 MED ORDER — FENTANYL CITRATE (PF) 100 MCG/2ML IJ SOLN
INTRAMUSCULAR | Status: AC
  Administered 2021-02-19: 50 ug via INTRAVENOUS
  Filled 2021-02-19: qty 2

## 2021-02-19 MED ORDER — METHOCARBAMOL 500 MG PO TABS: 500.0000 mg | ORAL_TABLET | Freq: Two times a day (BID) | ORAL | 0 refills | Status: DC | PRN

## 2021-02-19 MED ORDER — ONDANSETRON HCL 4 MG PO TABS: 4.0000 mg | ORAL_TABLET | Freq: Three times a day (TID) | ORAL | 0 refills | Status: DC | PRN

## 2021-02-19 MED ORDER — ALUM & MAG HYDROXIDE-SIMETH 200-200-20 MG/5ML PO SUSP: 30.0000 mL | ORAL | Status: DC | PRN

## 2021-02-19 MED ORDER — BUPIVACAINE IN DEXTROSE 0.75-8.25 % IT SOLN
INTRATHECAL | Status: DC | PRN
  Administered 2021-02-19: 2 mL via INTRATHECAL

## 2021-02-19 MED ORDER — CEFAZOLIN SODIUM-DEXTROSE 2-4 GM/100ML-% IV SOLN
2.0000 g | INTRAVENOUS | Status: AC
  Administered 2021-02-19: 2 g via INTRAVENOUS

## 2021-02-19 MED ORDER — SULFAMETHOXAZOLE-TRIMETHOPRIM 800-160 MG PO TABS: 1.0000 | ORAL_TABLET | Freq: Two times a day (BID) | ORAL | 0 refills | Status: DC

## 2021-02-19 MED ORDER — TRANEXAMIC ACID-NACL 1000-0.7 MG/100ML-% IV SOLN
1000.0000 mg | Freq: Once | INTRAVENOUS | Status: AC
  Administered 2021-02-19: 1000 mg via INTRAVENOUS
  Filled 2021-02-19: qty 100

## 2021-02-19 MED ORDER — BUPIVACAINE-MELOXICAM ER 400-12 MG/14ML IJ SOLN
INTRAMUSCULAR | Status: AC
  Filled 2021-02-19: qty 1

## 2021-02-19 MED ORDER — OXYCODONE HCL 5 MG PO TABS
ORAL_TABLET | ORAL | Status: AC
  Filled 2021-02-19: qty 1

## 2021-02-19 MED ORDER — LACTATED RINGERS IV SOLN: INTRAVENOUS | Status: DC

## 2021-02-19 MED ORDER — FENTANYL CITRATE (PF) 100 MCG/2ML IJ SOLN
25.0000 ug | INTRAMUSCULAR | Status: DC | PRN
  Administered 2021-02-19: 50 ug via INTRAVENOUS

## 2021-02-19 MED ORDER — MAGNESIUM CITRATE PO SOLN: 1.0000 | Freq: Once | ORAL | Status: DC | PRN

## 2021-02-19 MED ORDER — CHLORHEXIDINE GLUCONATE 0.12 % MT SOLN: 15.0000 mL | Freq: Once | OROMUCOSAL | Status: AC

## 2021-02-19 MED ORDER — FENTANYL CITRATE (PF) 100 MCG/2ML IJ SOLN
INTRAMUSCULAR | Status: DC | PRN
  Administered 2021-02-19: 25 ug via INTRAVENOUS

## 2021-02-19 MED ORDER — FENTANYL CITRATE (PF) 100 MCG/2ML IJ SOLN: 50.0000 ug | Freq: Once | INTRAMUSCULAR | Status: AC

## 2021-02-19 MED ORDER — DOCUSATE SODIUM 100 MG PO CAPS: 100.0000 mg | ORAL_CAPSULE | Freq: Every day | ORAL | 2 refills | Status: AC | PRN

## 2021-02-19 MED ORDER — ASPIRIN EC 81 MG PO TBEC: 81.0000 mg | DELAYED_RELEASE_TABLET | Freq: Two times a day (BID) | ORAL | 0 refills | Status: DC

## 2021-02-19 MED ORDER — FENTANYL CITRATE (PF) 100 MCG/2ML IJ SOLN
INTRAMUSCULAR | Status: AC
  Filled 2021-02-19: qty 2

## 2021-02-19 MED ORDER — PROPOFOL 500 MG/50ML IV EMUL
INTRAVENOUS | Status: DC | PRN
  Administered 2021-02-19: 150 ug/kg/min via INTRAVENOUS

## 2021-02-19 MED ORDER — TRANEXAMIC ACID-NACL 1000-0.7 MG/100ML-% IV SOLN
INTRAVENOUS | Status: AC
  Filled 2021-02-19: qty 100

## 2021-02-19 MED ORDER — METOCLOPRAMIDE HCL 5 MG PO TABS: 5.0000 mg | ORAL_TABLET | Freq: Three times a day (TID) | ORAL | Status: DC | PRN

## 2021-02-19 MED ORDER — VANCOMYCIN HCL 1000 MG IV SOLR
INTRAVENOUS | Status: AC
  Filled 2021-02-19: qty 1000

## 2021-02-19 MED ORDER — FENTANYL CITRATE (PF) 250 MCG/5ML IJ SOLN
INTRAMUSCULAR | Status: AC
  Filled 2021-02-19: qty 5

## 2021-02-19 MED ORDER — PHENYLEPHRINE HCL (PRESSORS) 10 MG/ML IV SOLN
INTRAVENOUS | Status: AC
  Filled 2021-02-19: qty 1

## 2021-02-19 MED ORDER — HYDROMORPHONE HCL 1 MG/ML IJ SOLN
INTRAMUSCULAR | Status: AC
  Filled 2021-02-19: qty 1

## 2021-02-19 MED ORDER — MIDAZOLAM HCL 2 MG/2ML IJ SOLN
INTRAMUSCULAR | Status: AC
  Filled 2021-02-19: qty 2

## 2021-02-19 MED ORDER — ALPRAZOLAM 0.5 MG PO TABS
2.0000 mg | ORAL_TABLET | Freq: Three times a day (TID) | ORAL | Status: DC
  Administered 2021-02-19 – 2021-02-20 (×2): 2 mg via ORAL
  Filled 2021-02-19 (×2): qty 4

## 2021-02-19 MED ORDER — METHOCARBAMOL 500 MG PO TABS
ORAL_TABLET | ORAL | Status: AC
  Administered 2021-02-19: 500 mg via ORAL
  Filled 2021-02-19: qty 1

## 2021-02-19 MED ORDER — ACETAMINOPHEN 500 MG PO TABS
1000.0000 mg | ORAL_TABLET | Freq: Once | ORAL | Status: AC
  Administered 2021-02-19: 1000 mg via ORAL
  Filled 2021-02-19: qty 2

## 2021-02-19 MED ORDER — OXYCODONE HCL 5 MG PO TABS
10.0000 mg | ORAL_TABLET | ORAL | Status: DC | PRN
  Administered 2021-02-19 – 2021-02-20 (×3): 15 mg via ORAL
  Filled 2021-02-19 (×3): qty 3

## 2021-02-19 MED ORDER — PROPOFOL 10 MG/ML IV BOLUS
INTRAVENOUS | Status: AC
  Filled 2021-02-19: qty 20

## 2021-02-19 MED ORDER — VANCOMYCIN HCL 1 G IV SOLR
INTRAVENOUS | Status: DC | PRN
  Administered 2021-02-19: 1000 mg via TOPICAL

## 2021-02-19 MED ORDER — INSULIN ASPART 100 UNIT/ML IJ SOLN
0.0000 [IU] | Freq: Every day | INTRAMUSCULAR | Status: DC
  Administered 2021-02-19: 3 [IU] via SUBCUTANEOUS

## 2021-02-19 MED ORDER — CEFAZOLIN SODIUM-DEXTROSE 2-4 GM/100ML-% IV SOLN
INTRAVENOUS | Status: AC
  Filled 2021-02-19: qty 100

## 2021-02-19 MED ORDER — CHLORHEXIDINE GLUCONATE 0.12 % MT SOLN
OROMUCOSAL | Status: AC
  Administered 2021-02-19: 15 mL via OROMUCOSAL
  Filled 2021-02-19: qty 15

## 2021-02-19 MED ORDER — ACETAMINOPHEN 500 MG PO TABS
1000.0000 mg | ORAL_TABLET | Freq: Four times a day (QID) | ORAL | Status: DC
  Administered 2021-02-19 – 2021-02-20 (×3): 1000 mg via ORAL
  Filled 2021-02-19 (×3): qty 2

## 2021-02-19 MED ORDER — PROPOFOL 500 MG/50ML IV EMUL: INTRAVENOUS | Status: DC | PRN

## 2021-02-19 MED ORDER — AMIODARONE HCL 200 MG PO TABS
200.0000 mg | ORAL_TABLET | Freq: Every day | ORAL | Status: DC
  Administered 2021-02-20: 200 mg via ORAL
  Filled 2021-02-19: qty 1

## 2021-02-19 MED ORDER — DEXTROSE 50 % IV SOLN
INTRAVENOUS | Status: AC
  Filled 2021-02-19: qty 50

## 2021-02-19 MED ORDER — METHOCARBAMOL 500 MG PO TABS
500.0000 mg | ORAL_TABLET | Freq: Four times a day (QID) | ORAL | Status: DC | PRN
  Administered 2021-02-19 – 2021-02-20 (×2): 500 mg via ORAL
  Filled 2021-02-19 (×2): qty 1

## 2021-02-19 MED ORDER — POVIDONE-IODINE 10 % EX SWAB
2.0000 "application " | Freq: Once | CUTANEOUS | Status: AC
  Administered 2021-02-19: 2 via TOPICAL

## 2021-02-19 MED ORDER — HYDROMORPHONE HCL 1 MG/ML IJ SOLN
0.5000 mg | INTRAMUSCULAR | Status: DC | PRN
  Administered 2021-02-19 (×2): 1 mg via INTRAVENOUS
  Filled 2021-02-19: qty 1

## 2021-02-19 MED ORDER — MENTHOL 3 MG MT LOZG: 1.0000 | LOZENGE | OROMUCOSAL | Status: DC | PRN

## 2021-02-19 MED ORDER — DILTIAZEM HCL ER COATED BEADS 120 MG PO CP24
240.0000 mg | ORAL_CAPSULE | Freq: Every day | ORAL | Status: DC
  Administered 2021-02-20: 240 mg via ORAL
  Filled 2021-02-19: qty 2

## 2021-02-19 MED ORDER — OXYCODONE-ACETAMINOPHEN 5-325 MG PO TABS: 1.0000 | ORAL_TABLET | Freq: Four times a day (QID) | ORAL | 0 refills | Status: DC | PRN

## 2021-02-19 MED ORDER — OXYCODONE HCL ER 10 MG PO T12A
10.0000 mg | EXTENDED_RELEASE_TABLET | Freq: Two times a day (BID) | ORAL | Status: DC
  Administered 2021-02-19 – 2021-02-20 (×2): 10 mg via ORAL
  Filled 2021-02-19 (×2): qty 1

## 2021-02-19 MED ORDER — DOCUSATE SODIUM 100 MG PO CAPS
100.0000 mg | ORAL_CAPSULE | Freq: Two times a day (BID) | ORAL | Status: DC
  Administered 2021-02-19 – 2021-02-20 (×2): 100 mg via ORAL
  Filled 2021-02-19 (×2): qty 1

## 2021-02-19 MED ORDER — SODIUM CHLORIDE 0.9 % IR SOLN
Status: DC | PRN
  Administered 2021-02-19: 3000 mL

## 2021-02-19 MED ORDER — ACETAMINOPHEN 325 MG PO TABS: 325.0000 mg | ORAL_TABLET | Freq: Four times a day (QID) | ORAL | Status: DC | PRN

## 2021-02-19 MED ORDER — GABAPENTIN 300 MG PO CAPS
600.0000 mg | ORAL_CAPSULE | Freq: Two times a day (BID) | ORAL | Status: DC
  Administered 2021-02-19: 600 mg via ORAL

## 2021-02-19 MED ORDER — SORBITOL 70 % SOLN
30.0000 mL | Freq: Every day | Status: DC | PRN
  Filled 2021-02-19: qty 30

## 2021-02-19 MED ORDER — ORAL CARE MOUTH RINSE: 15.0000 mL | Freq: Once | OROMUCOSAL | Status: AC

## 2021-02-19 MED ORDER — CLOPIDOGREL BISULFATE 75 MG PO TABS
75.0000 mg | ORAL_TABLET | Freq: Every day | ORAL | Status: DC
  Administered 2021-02-19 – 2021-02-20 (×2): 75 mg via ORAL
  Filled 2021-02-19 (×2): qty 1

## 2021-02-19 MED ORDER — POLYETHYLENE GLYCOL 3350 17 G PO PACK
17.0000 g | PACK | Freq: Every day | ORAL | Status: DC
  Administered 2021-02-19 – 2021-02-20 (×2): 17 g via ORAL
  Filled 2021-02-19 (×2): qty 1

## 2021-02-19 MED ORDER — PANTOPRAZOLE SODIUM 40 MG PO TBEC
40.0000 mg | DELAYED_RELEASE_TABLET | Freq: Every day | ORAL | Status: DC
  Administered 2021-02-19 – 2021-02-20 (×2): 40 mg via ORAL
  Filled 2021-02-19 (×2): qty 1

## 2021-02-19 MED ORDER — ASPIRIN EC 81 MG PO TBEC
81.0000 mg | DELAYED_RELEASE_TABLET | Freq: Every day | ORAL | Status: DC
  Administered 2021-02-19 – 2021-02-20 (×2): 81 mg via ORAL
  Filled 2021-02-19 (×2): qty 1

## 2021-02-19 SURGICAL SUPPLY — 63 items
BAG COUNTER SPONGE SURGICOUNT (BAG) ×2 IMPLANT
BAG DECANTER FOR FLEXI CONT (MISCELLANEOUS) ×2 IMPLANT
CELLS DAT CNTRL 66122 CELL SVR (MISCELLANEOUS) IMPLANT
COOLER ICEMAN CLASSIC (MISCELLANEOUS) ×2 IMPLANT
COVER PERINEAL POST (MISCELLANEOUS) ×2 IMPLANT
COVER SURGICAL LIGHT HANDLE (MISCELLANEOUS) ×2 IMPLANT
CUP ACETAB SECTOR 64 HIP (Hips) ×2 IMPLANT
DERMABOND ADHESIVE PROPEN (GAUZE/BANDAGES/DRESSINGS) ×1
DERMABOND ADVANCED .7 DNX6 (GAUZE/BANDAGES/DRESSINGS) ×1 IMPLANT
DRAPE C-ARM 42X72 X-RAY (DRAPES) ×2 IMPLANT
DRAPE POUCH INSTRU U-SHP 10X18 (DRAPES) ×2 IMPLANT
DRAPE STERI IOBAN 125X83 (DRAPES) ×2 IMPLANT
DRAPE U-SHAPE 47X51 STRL (DRAPES) ×4 IMPLANT
DRESSING AQUACEL AG SP 3.5X10 (GAUZE/BANDAGES/DRESSINGS) ×1 IMPLANT
DRSG AQUACEL AG ADV 3.5X10 (GAUZE/BANDAGES/DRESSINGS) ×2 IMPLANT
DRSG AQUACEL AG SP 3.5X10 (GAUZE/BANDAGES/DRESSINGS) ×2
DURAPREP 26ML APPLICATOR (WOUND CARE) ×4 IMPLANT
ELECT BLADE 4.0 EZ CLEAN MEGAD (MISCELLANEOUS) ×2
ELECT REM PT RETURN 9FT ADLT (ELECTROSURGICAL) ×2
ELECTRODE BLDE 4.0 EZ CLN MEGD (MISCELLANEOUS) ×1 IMPLANT
ELECTRODE REM PT RTRN 9FT ADLT (ELECTROSURGICAL) ×1 IMPLANT
GLOVE SURG LTX SZ7 (GLOVE) ×4 IMPLANT
GLOVE SURG NEOP MICRO LF SZ7.5 (GLOVE) ×2 IMPLANT
GLOVE SURG SYN 7.5  E (GLOVE) ×4
GLOVE SURG SYN 7.5 E (GLOVE) ×4 IMPLANT
GLOVE SURG UNDER POLY LF SZ7 (GLOVE) ×10 IMPLANT
GOWN STRL REIN XL XLG (GOWN DISPOSABLE) ×2 IMPLANT
GOWN STRL REUS W/ TWL LRG LVL3 (GOWN DISPOSABLE) IMPLANT
GOWN STRL REUS W/ TWL XL LVL3 (GOWN DISPOSABLE) ×1 IMPLANT
GOWN STRL REUS W/TWL LRG LVL3 (GOWN DISPOSABLE)
GOWN STRL REUS W/TWL XL LVL3 (GOWN DISPOSABLE) ×1
HANDPIECE INTERPULSE COAX TIP (DISPOSABLE) ×1
HEAD CERAMIC 36 PLUS5 (Hips) ×2 IMPLANT
HOOD PEEL AWAY FLYTE STAYCOOL (MISCELLANEOUS) ×4 IMPLANT
IV NS IRRIG 3000ML ARTHROMATIC (IV SOLUTION) ×2 IMPLANT
JET LAVAGE IRRISEPT WOUND (IRRIGATION / IRRIGATOR) ×2
KIT BASIN OR (CUSTOM PROCEDURE TRAY) ×2 IMPLANT
LAVAGE JET IRRISEPT WOUND (IRRIGATION / IRRIGATOR) ×1 IMPLANT
LINER NEUTRAL 64X36 P4 (Hips) ×2 IMPLANT
MARKER SKIN DUAL TIP RULER LAB (MISCELLANEOUS) ×2 IMPLANT
NEEDLE SPNL 18GX3.5 QUINCKE PK (NEEDLE) ×2 IMPLANT
PACK TOTAL JOINT (CUSTOM PROCEDURE TRAY) ×2 IMPLANT
PACK UNIVERSAL I (CUSTOM PROCEDURE TRAY) ×2 IMPLANT
RTRCTR WOUND ALEXIS 18CM MED (MISCELLANEOUS)
SAW OSC TIP CART 19.5X105X1.3 (SAW) ×2 IMPLANT
SCREW 6.5MMX25MM (Screw) ×2 IMPLANT
SET HNDPC FAN SPRY TIP SCT (DISPOSABLE) ×1 IMPLANT
STAPLER VISISTAT 35W (STAPLE) IMPLANT
STEM FEMORAL SZ6 HIGH ACTIS (Stem) ×2 IMPLANT
SUT ETHIBOND 2 V 37 (SUTURE) ×2 IMPLANT
SUT ETHILON 2 0 FS 18 (SUTURE) ×4 IMPLANT
SUT VIC AB 0 CT1 27 (SUTURE) ×1
SUT VIC AB 0 CT1 27XBRD ANBCTR (SUTURE) ×1 IMPLANT
SUT VIC AB 1 CTX 36 (SUTURE) ×1
SUT VIC AB 1 CTX36XBRD ANBCTR (SUTURE) ×1 IMPLANT
SUT VIC AB 2-0 CT1 27 (SUTURE) ×2
SUT VIC AB 2-0 CT1 TAPERPNT 27 (SUTURE) ×2 IMPLANT
SYR 50ML LL SCALE MARK (SYRINGE) ×2 IMPLANT
TOWEL GREEN STERILE (TOWEL DISPOSABLE) ×2 IMPLANT
TRAY CATH 16FR W/PLASTIC CATH (SET/KITS/TRAYS/PACK) IMPLANT
TRAY FOLEY W/BAG SLVR 16FR (SET/KITS/TRAYS/PACK) ×1
TRAY FOLEY W/BAG SLVR 16FR ST (SET/KITS/TRAYS/PACK) ×1 IMPLANT
YANKAUER SUCT BULB TIP NO VENT (SUCTIONS) ×2 IMPLANT

## 2021-02-19 NOTE — Anesthesia Procedure Notes (Addendum)
Spinal  Patient location during procedure: OR Start time: 02/19/2021 1:10 PM End time: 02/19/2021 1:19 PM Reason for block: surgical anesthesia Staffing Performed: anesthesiologist  Anesthesiologist: Marcene Duos, MD Preanesthetic Checklist Completed: patient identified, IV checked, site marked, risks and benefits discussed, surgical consent, monitors and equipment checked, pre-op evaluation and timeout performed Spinal Block Patient position: sitting Prep: DuraPrep Patient monitoring: heart rate, cardiac monitor, continuous pulse ox and blood pressure Approach: right paramedian Location: L4-5 Injection technique: single-shot Needle Needle type: Quincke  Needle gauge: 22 G Needle length: 9 cm Assessment Sensory level: T4 Events: CSF return Additional Notes Multiple attempts at midline SAB unsuccessful. CSF obtained via right paramedian approach with aspiration before injection. Upon injection of the last 0.2-0.49ml syringe disconnected and part of dose not given. Pt tolerated foley. Will have surgeon test prior to incision.

## 2021-02-19 NOTE — Transfer of Care (Signed)
Immediate Anesthesia Transfer of Care Note  Patient: Dean [name]  Procedure(s) Performed: LEFT TOTAL HIP ARTHROPLASTY ANTERIOR APPROACH (Left: Hip)  Patient Location: PACU  Anesthesia Type:Spinal  Level of Consciousness: drowsy and patient cooperative  Airway & Oxygen Therapy: Patient Spontanous Breathing and Patient connected to face mask oxygen  Post-op Assessment: Report given to RN, Post -op Vital signs reviewed and stable and Patient moving all extremities  Post vital signs: Reviewed and stable  Last Vitals:  Vitals Value Taken Time  BP 96/45 02/19/21 1534  Temp    Pulse 43 02/19/21 1541  Resp 13 02/19/21 1541  SpO2 100 % 02/19/21 1541  Vitals shown include unvalidated device data.  Last Pain:  Vitals:   02/19/21 1204  TempSrc:   PainSc: 5       Patients Stated Pain Goal: 4 (02/19/21 1204)  Complications: No notable events documented.

## 2021-02-19 NOTE — Discharge Instructions (Signed)

## 2021-02-19 NOTE — Op Note (Signed)
LEFT TOTAL HIP ARTHROPLASTY ANTERIOR APPROACH  Procedure Note Dean [name]   [id number]  Pre-op Diagnosis: left hip avascular necrosis, degenerative joint disease     Post-op Diagnosis: same   Operative Procedures  1. Total hip replacement; Left hip; uncemented [id number]   Surgeon: Gershon Mussel, M.D.  Assist: Oneal Grout, PA-C   Anesthesia: spinal, local  Prosthesis: Depuy Acetabulum: Pinnacle 64 mm Femur: Actis 6 HO Head: 36 mm size: +5 Liner: +4 Bearing Type: ceramic/poly  Total Hip Arthroplasty (Anterior Approach) Op Note:  After informed consent was obtained and the operative extremity marked in the holding area, the patient was brought back to the operating room and placed supine on the HANA table. Next, the operative extremity was prepped and draped in normal sterile fashion. Surgical timeout occurred verifying patient identification, surgical site, surgical procedure and administration of antibiotics.  A modified anterior Smith-Peterson approach to the hip was performed, using the interval between tensor fascia lata and sartorius.  Dissection was carried bluntly down onto the anterior hip capsule. The lateral femoral circumflex vessels were identified and coagulated. A capsulotomy was performed and the capsular flaps tagged for later repair.  The neck osteotomy was performed. The femoral head was removed which showed denuded cartilage at the superior weight bearing surface, the acetabular rim was cleared of soft tissue and attention was turned to reaming the acetabulum.  Sequential reaming was performed under fluoroscopic guidance. We reamed to a size 63 mm, and then impacted the acetabular shell. A 25 mm cancellous screw was placed through the shell for added fixation.  The liner was then placed after irrigation and attention turned to the femur.  After placing the femoral hook, the leg was taken to externally rotated, extended and adducted position taking care to  perform soft tissue releases to allow for adequate mobilization of the femur. Soft tissue was cleared from the shoulder of the greater trochanter and the hook elevator used to improve exposure of the proximal femur. Sequential broaching performed up to a size 6. Trial neck and head were placed. The leg was brought back up to neutral and the construct reduced.  Antibiotic irrigation was placed in the surgical wound and kept for at least 1 minute.  The position and sizing of components, offset and leg lengths were checked using fluoroscopy. Stability of the construct was checked in extension and external rotation without any subluxation or impingement of prosthesis. We dislocated the prosthesis, dropped the leg back into position, removed trial components, and irrigated copiously. The final stem and head was then placed, the leg brought back up, the system reduced and fluoroscopy used to verify positioning.  We irrigated, obtained hemostasis and closed the capsule using #2 ethibond suture.  One gram of vancomycin powder was placed in the surgical bed.   One gram of topical tranexamic acid was injected into the joint.  The fascia was closed with #1 vicryl plus, the deep fat layer was closed with 0 vicryl, the subcutaneous layers closed with 2.0 Vicryl Plus and the skin closed with 2.0 nylon and dermabond. A sterile dressing was applied. The patient was awakened in the operating room and taken to recovery in stable condition.  All sponge, needle, and instrument counts were correct at the end of the case.   Tessa Lerner, my PA, was a medical necessity for opening, closing, limb positioning, retracting, exposing, and overall facilitation and timely completion of the surgery.  Position: supine  Complications: see description of procedure.  Time  Out: performed   Drains/Packing: none  Estimated blood loss: see anesthesia record  Returned to Recovery Room: in good condition.   Antibiotics: yes    Mechanical VTE (DVT) Prophylaxis: sequential compression devices, TED thigh-high  Chemical VTE (DVT) Prophylaxis: aspirin and plavix   Fluid Replacement: see anesthesia record  Specimens Removed: 1 to pathology   Sponge and Instrument Count Correct? yes   PACU: portable radiograph - low AP   Plan/RTC: Return in 2 weeks for staple removal. Weight Bearing/Load Lower Extremity: full  Hip precautions: none Suture Removal: 2 weeks   N. Glee Arvin, MD [name] 2:55 PM   Implant Name Type Inv. Item Serial No. Manufacturer Lot No. LRB No. Used Action  CUP ACETAB SECTOR 64 HIP - [id number] Hips CUP ACETAB SECTOR 64 HIP  [employer] [id number] Left 1 Implanted  LINER NEUTRAL 64X36 P4 - [id number] Hips LINER NEUTRAL 64X36 P4  [employer] [id number] Left 1 Implanted  SCREW 6.5MMX25MM - [id number] Screw SCREW 6.5MMX25MM  [employer] [id number] Left 1 Implanted  STEM FEMORAL SZ6 HIGH ACTIS - [id number] Stem STEM FEMORAL SZ6 HIGH ACTIS  [employer] [id number] Left 1 Implanted  HEAD CERAMIC 36 PLUS5 - [id number] Hips HEAD CERAMIC 36 PLUS5  [employer] [id number] Left 1 Implanted

## 2021-02-19 NOTE — H&P (Signed)
PREOPERATIVE H&P  Chief Complaint: left hip avascular necrosis  HPI: Dean [name] is a 50 y.o. male who presents for surgical treatment of left hip avascular necrosis.  He denies any changes in medical history.  Past Medical History:  Diagnosis Date   Anxiety    Asthma    Atrial arrhythmia    multiples with 5-6 ablations   Atrial fibrillation with rapid ventricular response (HCC)    With rates up to 200 as well as short PR tachycardia, cycle length of 240 ms.   Chest tightness 05/08/2014   Diabetes mellitus, type II (HCC)    Dysrhythmia    GERD (gastroesophageal reflux disease)    Morbid obesity (HCC)    NSTEMI (non-ST elevated myocardial infarction) (HCC) 2021   Palpitations 05/08/2014   Pancreatitis    Pneumonia ~ 2010   Snores    Wil occasional apnea. Had a remote sleep study but does not remember results.   SOB (shortness of breath) 05/08/2014   SVT (supraventricular tachycardia) (HCC)    WPW (Wolff-Parkinson-White syndrome)    Past Surgical History:  Procedure Laterality Date   ATRIAL FIBRILLATION ABLATION     "& WPW"   CARDIAC CATHETERIZATION     "6 or 7" (05/18/2016)   CORONARY STENT INTERVENTION N/A 11/15/2019   Procedure: CORONARY STENT INTERVENTION;  Surgeon: Kathleene Hazel, MD;  Location: [facility];  Service: Cardiovascular;  Laterality: N/A;   FRACTURE SURGERY     LEFT HEART CATH AND CORONARY ANGIOGRAPHY N/A 11/15/2019   Procedure: LEFT HEART CATH AND CORONARY ANGIOGRAPHY;  Surgeon: Kathleene Hazel, MD;  Location: [facility];  Service: Cardiovascular;  Laterality: N/A;   WRIST FRACTURE SURGERY Right 1990s   Social History   Socioeconomic History   Marital status: Married    Spouse name: Sales executive   Number of children: 1   Years of education: Not on file   Highest education level: Not on file  Occupational History   Not on file  Tobacco Use   Smoking status: Every Day    Packs/day: 1.00    Years: 30.00     Pack years: 30.00    Types: Cigarettes   Smokeless tobacco: Never  Vaping Use   Vaping Use: Never used  Substance and Sexual Activity   Alcohol use: No    Comment: none x 2 weeks   Drug use: No   Sexual activity: Yes  Other Topics Concern   Not on file  Social History Narrative   Not on file   Social Determinants of Health   Financial Resource Strain: Not on file  Food Insecurity: Not on file  Transportation Needs: Not on file  Physical Activity: Not on file  Stress: Not on file  Social Connections: Not on file   Family History  Problem Relation Age of Onset   Clotting disorder Father    Diabetes Father    Heart attack Father 74   Hypertension Father    Allergies  Allergen Reactions   Prednisone Other (See Comments)    "goes crazy"   Prior to Admission medications   Medication Sig Start Date End Date Taking? Authorizing Provider  alprazolam Prudy Feeler) 2 MG tablet Take 2 mg by mouth 3 (three) times daily.   Yes [provider]  amiodarone (PACERONE) 200 MG tablet TAKE 1 TABLET ONCE DAILY. Patient taking differently: Take 200 mg by mouth daily. 12/14/16  Yes Marinus Maw, MD  aspirin EC 81 MG  tablet Take 1 tablet (81 mg total) by mouth 2 (two) times daily. 02/19/21  Yes Cristie Hem, PA-C  atorvastatin (LIPITOR) 80 MG tablet Take 1 tablet (80 mg total) by mouth daily at 6 PM. 11/17/19  Yes Laverda Page B, NP  bisoprolol (ZEBETA) 5 MG tablet TAKE 1 TABLET ONCE DAILY. Patient taking differently: Take 5 mg by mouth daily. 05/22/20  Yes Marinus Maw, MD  budesonide-formoterol [facility]) 160-4.5 MCG/ACT inhaler Inhale 2 puffs into the lungs 2 (two) times daily as needed (shortness of breath).   Yes [provider]  cetirizine (ZYRTEC) 10 MG tablet Take 10 mg by mouth daily.   Yes [provider]  clopidogrel (PLAVIX) 75 MG tablet Take 75 mg by mouth daily.   Yes [provider]  diltiazem (CARDIZEM CD) 240 MG 24 hr capsule Take 1  capsule (240 mg total) by mouth daily. 05/22/20  Yes Marinus Maw, MD  docusate sodium (COLACE) 100 MG capsule Take 1 capsule (100 mg total) by mouth daily as needed. 02/19/21 02/19/22 Yes Cristie Hem, PA-C  gabapentin (NEURONTIN) 300 MG capsule Take 600-900 mg by mouth See admin instructions. Take 600 mg in the morning, 600 mg at lunch, and 900 mg at night 01/16/21  Yes [provider]  isosorbide mononitrate (IMDUR) 60 MG 24 hr tablet Take 1 tablet (60 mg total) by mouth daily. 05/22/20  Yes Marinus Maw, MD  lactulose (CHRONULAC) 10 GM/15ML solution Take 15 mLs by mouth 2 (two) times daily as needed for moderate constipation. 12/21/20  Yes [provider]  levalbuterol (XOPENEX HFA) 45 MCG/ACT inhaler Inhale 2 puffs into the lungs every 6 (six) hours as needed for wheezing or shortness of breath.   Yes [provider]  levalbuterol (XOPENEX) 1.25 MG/3ML nebulizer solution Take 1.25 mg by nebulization every 6 (six) hours as needed for shortness of breath or wheezing. 01/01/20  Yes [provider]  metFORMIN (GLUCOPHAGE) 1000 MG tablet Take 1,000 mg by mouth 2 (two) times daily. 08/17/15  Yes [provider]  montelukast (SINGULAIR) 10 MG tablet Take 10 mg by mouth every morning.    Yes [provider]  multivitamin-iron-minerals-folic acid (CENTRUM) chewable tablet Chew 1 tablet by mouth daily.   Yes [provider]  pantoprazole (PROTONIX) 40 MG tablet Take 40 mg by mouth daily. 09/26/19  Yes [provider]  sucralfate (CARAFATE) 1 g tablet Take 1 tablet (1 g total) by mouth 4 (four) times daily -  with meals and at bedtime. Patient taking differently: Take 1 g by mouth daily as needed (upset stomach). 07/09/17  Yes Rolan Bucco, MD  sulfamethoxazole-trimethoprim (BACTRIM DS) 800-160 MG tablet Take 1 tablet by mouth 2 (two) times daily. To be taken after surgery 02/19/21   Cristie Hem, PA-C  HYDROcodone-acetaminophen [facility])  10-325 MG tablet Take 1 tablet by mouth every 8 (eight) hours as needed for up to 25 doses for severe pain. Patient not taking: Reported on 02/18/2021 01/10/21   Virgina Norfolk, DO  methocarbamol (ROBAXIN) 500 MG tablet Take 1 tablet (500 mg total) by mouth 2 (two) times daily as needed. 02/19/21   Cristie Hem, PA-C  nitroGLYCERIN (NITROSTAT) 0.4 MG SL tablet Place 1 tablet (0.4 mg total) under the tongue every 5 (five) minutes x 3 doses as needed for chest pain. 11/17/19   Arty Baumgartner, NP  ondansetron (ZOFRAN ODT) 8 MG disintegrating tablet Take 1 tablet (8 mg total) by mouth every 8 (  eight) hours as needed for nausea. 01/20/21   Derwood Kaplan, MD  ondansetron (ZOFRAN) 4 MG tablet Take 1 tablet (4 mg total) by mouth every 8 (eight) hours as needed for nausea or vomiting. 02/19/21   Cristie Hem, PA-C  oxyCODONE-acetaminophen (PERCOCET) 5-325 MG tablet Take 1-2 tablets by mouth every 6 (six) hours as needed. 02/19/21   Cristie Hem, PA-C     Positive ROS: All other systems have been reviewed and were otherwise negative with the exception of those mentioned in the HPI and as above.  Physical Exam: General: Alert, no acute distress Cardiovascular: No pedal edema Respiratory: No cyanosis, no use of accessory musculature GI: abdomen soft Skin: No lesions in the area of chief complaint Neurologic: Sensation intact distally Psychiatric: Patient is competent for consent with normal mood and affect Lymphatic: no lymphedema  MUSCULOSKELETAL: exam stable  Assessment: left hip avascular necrosis  Plan: Plan for Procedure(s): LEFT TOTAL HIP ARTHROPLASTY ANTERIOR APPROACH  The risks benefits and alternatives were discussed with the patient including but not limited to the risks of nonoperative treatment, versus surgical intervention including infection, bleeding, nerve injury,  blood clots, cardiopulmonary complications, morbidity, mortality, among others, and they were willing to proceed.    Preoperative templating of the joint replacement has been completed, documented, and submitted to the Operating Room personnel in order to optimize intra-operative equipment management.   Glee Arvin, MD 02/19/2021 10:56 AM

## 2021-02-20 ENCOUNTER — Encounter (HOSPITAL_COMMUNITY): Payer: Self-pay | Admitting: Orthopaedic Surgery

## 2021-02-20 ENCOUNTER — Other Ambulatory Visit: Payer: Self-pay | Admitting: Physician Assistant

## 2021-02-20 DIAGNOSIS — M1612 Unilateral primary osteoarthritis, left hip: Secondary | ICD-10-CM | POA: Diagnosis not present

## 2021-02-20 LAB — CBC
HCT: 33.5 % — ABNORMAL LOW (ref 39.0–52.0)
Hemoglobin: 11.2 g/dL — ABNORMAL LOW (ref 13.0–17.0)
MCH: 30.9 pg (ref 26.0–34.0)
MCHC: 33.4 g/dL (ref 30.0–36.0)
MCV: 92.5 fL (ref 80.0–100.0)
Platelets: 274 10*3/uL (ref 150–400)
RBC: 3.62 MIL/uL — ABNORMAL LOW (ref 4.22–5.81)
RDW: 13.9 % (ref 11.5–15.5)
WBC: 16.6 10*3/uL — ABNORMAL HIGH (ref 4.0–10.5)
nRBC: 0 % (ref 0.0–0.2)

## 2021-02-20 LAB — BASIC METABOLIC PANEL
Anion gap: 6 (ref 5–15)
BUN: 6 mg/dL (ref 6–20)
CO2: 28 mmol/L (ref 22–32)
Calcium: 8.5 mg/dL — ABNORMAL LOW (ref 8.9–10.3)
Chloride: 100 mmol/L (ref 98–111)
Creatinine, Ser: 0.88 mg/dL (ref 0.61–1.24)
GFR, Estimated: >60 mL/min (ref >60)
Glucose, Bld: 90 mg/dL (ref 70–99)
Potassium: 4.4 mmol/L (ref 3.5–5.1)
Sodium: 134 mmol/L — ABNORMAL LOW (ref 135–145)

## 2021-02-20 LAB — GLUCOSE, CAPILLARY: Glucose-Capillary: 90 mg/dL (ref 70–99)

## 2021-02-20 LAB — HEPATITIS B SURFACE ANTIGEN: Hepatitis B Surface Ag: NONREACTIVE

## 2021-02-20 NOTE — Evaluation (Signed)
Occupational Therapy Evaluation Patient Details Name: Dean [name] MRN: [id number] DOB: 09-Jan-1971 Today's Date: 02/20/2021    History of Present Illness Patient is a 50 year old male s/p L anterior total hip arthroplasty due to avascular necrosis. PMH includes Anxiety, Asthma, Atrial arrhythmia, Atrial fibrillation with rapid ventricular response, Diabetes mellitus, type II. Patient reports needing R hip surgery "in next 10 weeks or so."   Clinical Impression   Patient lives in a single level house with a ramp entry, currently spouse not staying with patient but reports has 63 year old daughter "that can help with some things" as well as friends he may be able to stay with for first few days. Patient primarily limitation is pain in bilateral hips, reports needing hip replacement in R hip as well. Patient needing min A to manage L leg in/out of bed, supervision with sit to stand from elevated surface and supervision with cues for sequencing to don underwear, declined further dressing. Patient needing increased time to ambulate to/from bathroom with walker at supervision level 2* pain but no overt loss of balance. Patient would benefit from at least initial support at home due to history of falls and reports of significant pain in bilateral hips, OT will continue to follow acutely.    Follow Up Recommendations  Follow surgeon's recommendation for DC plan and follow-up therapies;Supervision/Assistance - 24 hour (initial supervision due to hip pain and hx of fall)    Equipment Recommendations  3 in 1 bedside commode       Precautions / Restrictions Precautions Precautions: Fall Precaution Comments: reports fall ~1 month ago R leg gave out Restrictions Weight Bearing Restrictions: Yes RLE Weight Bearing: Weight bearing as tolerated      Mobility Bed Mobility Overal bed mobility: Needs Assistance Bed Mobility: Supine to Sit;Sit to Supine     Supine to sit: Min assist;HOB elevated Sit to  supine: Min assist   General bed mobility comments: min A for L LE management in/out of bed. does have recliner he plans to sleep in    Transfers Overall transfer level: Needs assistance Equipment used: Rolling walker (2 wheeled) Transfers: Sit to/from Stand Sit to Stand: Supervision;From elevated surface         General transfer comment: min cues for safety    Balance Overall balance assessment: Needs assistance;History of Falls Sitting-balance support: Feet supported Sitting balance-Leahy Scale: Good     Standing balance support: Single extremity supported;During functional activity Standing balance-Leahy Scale: Poor Standing balance comment: reliant on at least unilateral UE support standing sink side to brush teeth                           ADL either performed or assessed with clinical judgement   ADL Overall ADL's : Needs assistance/impaired Eating/Feeding: Independent   Grooming: Oral care;Wash/dry face;Supervision/safety;Standing   Upper Body Bathing: Set up;Sitting   Lower Body Bathing: Minimal assistance;Sit to/from stand;Sitting/lateral leans   Upper Body Dressing : Set up;Sitting   Lower Body Dressing: Set up;Minimal assistance;Sitting/lateral leans;Sit to/from stand Lower Body Dressing Details (indicate cue type and reason): patient unable to reach feet for socks however was able to thread underwear with cue for sequencing L LE first and supervision in standing to pull up over hips Toilet Transfer: Supervision/safety;Cueing for safety;Ambulation;RW Toilet Transfer Details (indicate cue type and reason): from elevated bed height, increased time due to pain, min cues for safety Toileting- Clothing Manipulation and Hygiene: Supervision/safety;Sitting/lateral lean;Sit to/from  stand       Functional mobility during ADLs: Supervision/safety;Rolling walker;Cueing for safety General ADL Comments: educate patient on set up of DME at home 3 in 1 over  toilet to increase height of toilet to maximize safety      Pertinent Vitals/Pain Pain Assessment: Faces Faces Pain Scale: Hurts even more Pain Location: L hip Pain Descriptors / Indicators: Aching;Grimacing;Sore Pain Intervention(s): Patient requesting pain meds-RN notified     Hand Dominance Right   Extremity/Trunk Assessment Upper Extremity Assessment Upper Extremity Assessment: Overall WFL for tasks assessed   Lower Extremity Assessment Lower Extremity Assessment: Defer to PT evaluation   Cervical / Trunk Assessment Cervical / Trunk Assessment: Normal   Communication Communication Communication: No difficulties   Cognition Arousal/Alertness: Awake/alert Behavior During Therapy: WFL for tasks assessed/performed Overall Cognitive Status: Within Functional Limits for tasks assessed                                 General Comments: tangential              Home Living Family/patient expects to be discharged to:: Private residence Living Arrangements: Children (10 yr old) Available Help at Discharge: Family;Friend(s);Available PRN/intermittently Type of Home: House Home Access: Level entry     Home Layout: One level     Bathroom Shower/Tub: Producer, television/film/video: Standard     Home Equipment: Environmental consultant - 2 wheels;Shower seat          Prior Functioning/Environment Level of Independence: Independent        Comments: reports out of work for past 3 months due to bilateral hip pain, runs a concrete company        OT Problem List: Pain;Decreased safety awareness;Decreased knowledge of use of DME or AE;Decreased activity tolerance;Impaired balance (sitting and/or standing)      OT Treatment/Interventions: Self-care/ADL training;DME and/or AE instruction;Therapeutic activities;Patient/family education;Balance training    OT Goals(Current goals can be found in the care plan section) Acute Rehab OT Goals Patient Stated Goal: not  fall OT Goal Formulation: With patient Time For Goal Achievement: 03/06/21 Potential to Achieve Goals: Good  OT Frequency: Min 2X/week    AM-PAC OT "6 Clicks" Daily Activity     Outcome Measure Help from another person eating meals?: None Help from another person taking care of personal grooming?: A Little Help from another person toileting, which includes using toliet, bedpan, or urinal?: A Little Help from another person bathing (including washing, rinsing, drying)?: A Little Help from another person to put on and taking off regular upper body clothing?: A Little Help from another person to put on and taking off regular lower body clothing?: A Little 6 Click Score: 19   End of Session Equipment Utilized During Treatment: Rolling walker Nurse Communication: Mobility status;Patient requests pain meds  Activity Tolerance: Patient tolerated treatment well;Patient limited by pain Patient left: in bed;with call bell/phone within reach  OT Visit Diagnosis: Pain;History of falling (Z91.81) Pain - Right/Left: Left Pain - part of body: Hip                Time: 7672-0947 OT Time Calculation (min): 38 min Charges:  OT General Charges $OT Visit: 1 Visit OT Evaluation $OT Eval Low Complexity: 1 Low OT Treatments $Self Care/Home Management : 23-37 mins  Marlyce Huge OT OT pager: [phone]  Carmelia Roller 02/20/2021, 8:40 AM

## 2021-02-20 NOTE — Discharge Summary (Signed)
Patient ID: Dean [name] MRN: [id number] DOB/AGE: Nov 09, 1970 50 y.o.  Admit date: 02/19/2021 Discharge date: 02/20/2021  Admission Diagnoses:  Principal Problem:   Avascular necrosis of bone of hip ([place]) Active Problems:   Status post total replacement of left hip   Discharge Diagnoses:  Same  Past Medical History:  Diagnosis Date   Anxiety    Asthma    Atrial arrhythmia    multiples with 5-6 ablations   Atrial fibrillation with rapid ventricular response ([place])    With rates up to 200 as well as short PR tachycardia, cycle length of 240 ms.   Chest tightness 05/08/2014   Diabetes mellitus, type II ([place])    Dysrhythmia    GERD (gastroesophageal reflux disease)    Morbid obesity ([place])    NSTEMI (non-ST elevated myocardial infarction) ([place]) 2021   Palpitations 05/08/2014   Pancreatitis    Pneumonia ~ 2010   Snores    Wil occasional apnea. Had a remote sleep study but does not remember results.   SOB (shortness of breath) 05/08/2014   SVT (supraventricular tachycardia) ([facility])    WPW (Wolff-Parkinson-White syndrome)     Surgeries: Procedure(s): LEFT TOTAL HIP ARTHROPLASTY ANTERIOR APPROACH on 02/19/2021   Consultants:   Discharged Condition: Improved  Hospital Course: MORLEY GAUMER is an 50 y.o. male who was admitted 02/19/2021 for operative treatment ofAvascular necrosis of bone of hip ([place]). Patient has severe unremitting pain that affects sleep, daily activities, and work/hobbies. After pre-op clearance the patient was taken to the operating room on 02/19/2021 and underwent  Procedure(s): LEFT TOTAL HIP ARTHROPLASTY ANTERIOR APPROACH.    Patient was given perioperative antibiotics:  Anti-infectives (From admission, onward)    Start     Dose/Rate Route Frequency Ordered Stop   02/19/21 1915  ceFAZolin (ANCEF) IVPB 2g/100 mL premix        2 g 200 mL/hr over 30 Minutes Intravenous Every 6 hours 02/19/21 1827 02/20/21 0046   02/19/21 1456  vancomycin (VANCOCIN) powder   Status:  Discontinued          As needed 02/19/21 1456 02/19/21 1541   02/19/21 1003  ceFAZolin (ANCEF) 2-4 GM/100ML-% IVPB       Note to Pharmacy: Wendall Mola   : cabinet override      02/19/21 1003 02/19/21 1349   02/19/21 1000  ceFAZolin (ANCEF) IVPB 2g/100 mL premix        2 g 200 mL/hr over 30 Minutes Intravenous On call to O.R. 02/19/21 0958 02/19/21 1340        Patient was given sequential compression devices, early ambulation, and chemoprophylaxis to prevent DVT.  Patient benefited maximally from hospital stay and there were no complications.    Recent vital signs: Patient Vitals for the past 24 hrs:  BP Temp Temp src Pulse Resp SpO2 Height Weight  02/20/21 0723 130/69 98.4 F (36.9 C) Oral 73 18 99 % -- --  02/20/21 0404 138/75 97.6 F (36.4 C) Oral 65 20 97 % -- --  02/19/21 2300 117/67 97.7 F (36.5 C) Oral (!) 59 20 97 % -- --  02/19/21 1928 131/62 (!) 97.5 F (36.4 C) Oral (!) 56 20 97 % -- --  02/19/21 1816 123/63 98.2 F (36.8 C) -- (!) 56 20 94 % -- --  02/19/21 1725 132/64 -- -- (!) 55 12 93 % -- --  02/19/21 1710 116/83 97.6 F (36.4 C) -- (!) 51 10 97 % -- --  02/19/21 1550 (!) 96/49 -- -- Marland Kitchen  42 14 99 % -- --  02/19/21 1535 (!) 96/45 -- -- (!) 41 10 99 % -- --  02/19/21 1203 (!) 110/50 -- -- (!) 55 17 95 % -- --  02/19/21 1156 (!) 110/53 -- -- (!) 52 18 -- -- --  02/19/21 1020 (!) 118/54 97.7 F (36.5 C) Oral (!) 56 17 97 % _0  (1.981 m) 107 kg     Recent laboratory studies:  Recent Labs    02/20/21 0531  WBC 16.6*  HGB 11.2*  HCT 33.5*  PLT 274  NA 134*  K 4.4  CL 100  CO2 28  BUN 6  CREATININE 0.88  GLUCOSE 90  CALCIUM 8.5*     Discharge Medications:   Allergies as of 02/20/2021       Reactions   Prednisone Other (See Comments)   "goes crazy"        Medication List     STOP taking these medications    alprazolam 2 MG tablet Commonly known as: XANAX   HYDROcodone-acetaminophen 10-325 MG tablet Commonly known as:  Norco   ondansetron 8 MG disintegrating tablet Commonly known as: Zofran ODT       TAKE these medications    aspirin EC 81 MG tablet Take 1 tablet (81 mg total) by mouth 2 (two) times daily.   atorvastatin 80 MG tablet Commonly known as: LIPITOR Take 1 tablet (80 mg total) by mouth daily at 6 PM.   budesonide-formoterol 160-4.5 MCG/ACT inhaler Commonly known as: SYMBICORT Inhale 2 puffs into the lungs 2 (two) times daily as needed (shortness of breath).   cetirizine 10 MG tablet Commonly known as: ZYRTEC Take 10 mg by mouth daily.   clopidogrel 75 MG tablet Commonly known as: PLAVIX Take 75 mg by mouth daily.   diltiazem 240 MG 24 hr capsule Commonly known as: CARDIZEM CD Take 1 capsule (240 mg total) by mouth daily.   docusate sodium 100 MG capsule Commonly known as: Colace Take 1 capsule (100 mg total) by mouth daily as needed.   gabapentin 300 MG capsule Commonly known as: NEURONTIN Take 600-900 mg by mouth See admin instructions. Take 600 mg in the morning, 600 mg at lunch, and 900 mg at night   isosorbide mononitrate 60 MG 24 hr tablet Commonly known as: IMDUR Take 1 tablet (60 mg total) by mouth daily.   lactulose 10 GM/15ML solution Commonly known as: CHRONULAC Take 15 mLs by mouth 2 (two) times daily as needed for moderate constipation.   levalbuterol 1.25 MG/3ML nebulizer solution Commonly known as: XOPENEX Take 1.25 mg by nebulization every 6 (six) hours as needed for shortness of breath or wheezing.   levalbuterol 45 MCG/ACT inhaler Commonly known as: XOPENEX HFA Inhale 2 puffs into the lungs every 6 (six) hours as needed for wheezing or shortness of breath.   metFORMIN 1000 MG tablet Commonly known as: GLUCOPHAGE Take 1,000 mg by mouth 2 (two) times daily.   methocarbamol 500 MG tablet Commonly known as: Robaxin Take 1 tablet (500 mg total) by mouth 2 (two) times daily as needed.   montelukast 10 MG tablet Commonly known as:  SINGULAIR Take 10 mg by mouth every morning.   multivitamin-iron-minerals-folic acid chewable tablet Chew 1 tablet by mouth daily.   nitroGLYCERIN 0.4 MG SL tablet Commonly known as: NITROSTAT Place 1 tablet (0.4 mg total) under the tongue every 5 (five) minutes x 3 doses as needed for chest pain.   ondansetron 4 MG tablet Commonly known  as: Zofran Take 1 tablet (4 mg total) by mouth every 8 (eight) hours as needed for nausea or vomiting.   oxyCODONE-acetaminophen 5-325 MG tablet Commonly known as: Percocet Take 1-2 tablets by mouth every 6 (six) hours as needed.   pantoprazole 40 MG tablet Commonly known as: PROTONIX Take 40 mg by mouth daily.   sulfamethoxazole-trimethoprim 800-160 MG tablet Commonly known as: BACTRIM DS Take 1 tablet by mouth 2 (two) times daily. To be taken after surgery       ASK your doctor about these medications    amiodarone 200 MG tablet Commonly known as: PACERONE TAKE 1 TABLET ONCE DAILY.   bisoprolol 5 MG tablet Commonly known as: ZEBETA TAKE 1 TABLET ONCE DAILY.   sucralfate 1 g tablet Commonly known as: Carafate Take 1 tablet (1 g total) by mouth 4 (four) times daily -  with meals and at bedtime.               Durable Medical Equipment  (From admission, onward)           Start     Ordered   02/19/21 1828  DME Walker rolling  Once       Question:  Patient needs a walker to treat with the following condition  Answer:  History of hip replacement   02/19/21 1827   02/19/21 1828  DME 3 n 1  Once        02/19/21 1827   02/19/21 1828  DME Bedside commode  Once       Question:  Patient needs a bedside commode to treat with the following condition  Answer:  History of hip replacement   02/19/21 1827            Diagnostic Studies: DG Chest 2 View  Result Date: 02/12/2021 CLINICAL DATA:  50 year old male with a history atrial fibrillation and tachycardia EXAM: CHEST - 2 VIEW COMPARISON:  12/31/2020 FINDINGS:  Cardiomediastinal silhouette unchanged in size and contour. No evidence of central vascular congestion. No interlobular septal thickening. Evidence of prior PTC I No pneumothorax or pleural effusion. Coarsened interstitial markings, with no confluent airspace disease. No acute displaced fracture. Degenerative changes of the spine. IMPRESSION: Negative for acute cardiopulmonary disease. Evidence of prior PTC I Electronically Signed   By: Corrie Mckusick D.O.   On: 02/12/2021 15:30   DG Pelvis Portable  Result Date: 02/19/2021 CLINICAL DATA:  Postop EXAM: PORTABLE PELVIS 1-2 VIEWS COMPARISON:  02/06/2021 FINDINGS: Interval left hip replacement with incompletely visualized femoral stem. Alignment at left hip is normal. Pubic symphysis and rami appear intact. IMPRESSION: Status post left hip replacement. Electronically Signed   By: Donavan Foil M.D.   On: 02/19/2021 16:31   DG C-Arm 1-60 Min  Result Date: 02/19/2021 CLINICAL DATA:  Left hip arthroplasty. EXAM: OPERATIVE LEFT HIP (WITH PELVIS IF PERFORMED) TECHNIQUE: Fluoroscopic spot image(s) were submitted for interpretation post-operatively. COMPARISON:  Radiograph 02/06/2021 FINDINGS: Four fluoroscopic spot views of the pelvis and left hip obtained in the operating room. Left hip arthroplasty placed. Fluoroscopy time 25 seconds. Dose 3.97 mGy. IMPRESSION: Procedural fluoroscopy during left hip arthroplasty. Electronically Signed   By: Keith Rake M.D.   On: 02/19/2021 15:15   DG HIP OPERATIVE UNILAT WITH PELVIS LEFT  Result Date: 02/19/2021 CLINICAL DATA:  Left hip arthroplasty. EXAM: OPERATIVE LEFT HIP (WITH PELVIS IF PERFORMED) TECHNIQUE: Fluoroscopic spot image(s) were submitted for interpretation post-operatively. COMPARISON:  Radiograph 02/06/2021 FINDINGS: Four fluoroscopic spot views of the pelvis and left  hip obtained in the operating room. Left hip arthroplasty placed. Fluoroscopy time 25 seconds. Dose 3.97 mGy. IMPRESSION: Procedural  fluoroscopy during left hip arthroplasty. Electronically Signed   By: Keith Rake M.D.   On: 02/19/2021 15:15   XR Pelvis 1-2 Views  Result Date: 02/06/2021 Small focus of AVN of the left femoral head with minimal collapse.  Moderate DJD of the right hip.   Disposition: Discharge disposition: 01-Home or Self Care          Follow-up Information     Leandrew Koyanagi, MD. Schedule an appointment as soon as possible for a visit in 2 week(s).   Specialty: Orthopedic Surgery Contact information: [address] [place] [address] [phone]                  Signed: Aundra Dubin 02/20/2021, 7:58 AM

## 2021-02-20 NOTE — Progress Notes (Signed)
Patient is discharged from room 3C07 at this time. Alert and in stable condition. IV site d/c'd and instructions read to patient with understanding verbalized and all questions answered. Left unit via wheelchair with all belongings at side. 

## 2021-02-20 NOTE — Evaluation (Signed)
Physical Therapy Evaluation Patient Details Name: Dean [name] MRN: [id number] DOB: 1971-04-28 Today's Date: 02/20/2021   History of Present Illness  Patient is a 50 year old male s/p L anterior total hip arthroplasty due to avascular necrosis. PMH includes Anxiety, Asthma, Atrial arrhythmia, Atrial fibrillation with rapid ventricular response, Diabetes mellitus, type II. Patient reports needing R hip surgery "in next 10 weeks or so."   Clinical Impression  Pt in bed upon arrival of PT, agreeable to evaluation at this time. Prior to admission the pt was progressively limited by pain, mobilizing short distances with use of a cane. The pt now presents with limitations in functional mobility, strength, power, ROM, stability, and activity tolerance due to above dx, and will continue to benefit from skilled PT to address these deficits. The pt requires min-modA to manage bed mobility due to pain and weakness in LLE. The pt is able to mobilize with RW on supervision level, but is limited to short distances, significantly limited by pain, and decreased strength and stability in LLE. He will continue to benefit form skilled PT acutely and following d/c to improve functional mobility, activity tolerance, and strength in BLE to increase safety with mobility in the home.      Follow Up Recommendations Home health PT;Supervision for mobility/OOB    Equipment Recommendations  3in1 (PT)    Recommendations for Other Services       Precautions / Restrictions Precautions Precautions: Fall Precaution Comments: reports fall ~1 month ago R leg gave out Restrictions Weight Bearing Restrictions: Yes RLE Weight Bearing: Weight bearing as tolerated      Mobility  Bed Mobility Overal bed mobility: Needs Assistance Bed Mobility: Supine to Sit;Sit to Supine     Supine to sit: Min assist;HOB elevated Sit to supine: Mod assist   General bed mobility comments: min A for L LE management out of bed, modA to  return to bed. does have recliner he plans to sleep in    Transfers Overall transfer level: Needs assistance Equipment used: Rolling walker (2 wheeled) Transfers: Sit to/from Stand Sit to Stand: Supervision         General transfer comment: min cues for safety  Ambulation/Gait Ambulation/Gait assistance: Min guard Gait Distance (Feet): 25 Feet Assistive device: Rolling walker (2 wheeled) Gait Pattern/deviations: Step-to pattern;Decreased stride length;Decreased weight shift to left;Trunk flexed Gait velocity: decreased Gait velocity interpretation: <1.31 ft/sec, indicative of household ambulator General Gait Details: heavy reliance on BUE, decreased wt shift to LLE, no instances of buckling. max cues for posture, positioning, but pt with limited application to mobility due to pain  Stairs            Wheelchair Mobility    Modified Rankin (Stroke Patients Only)       Balance Overall balance assessment: Needs assistance;History of Falls Sitting-balance support: Feet supported Sitting balance-Leahy Scale: Good     Standing balance support: Bilateral upper extremity supported Standing balance-Leahy Scale: Poor Standing balance comment: BUE support for gait due topain                             Pertinent Vitals/Pain Pain Assessment: 0-10 Pain Score: 8  Faces Pain Scale: Hurts even more Pain Location: L hip Pain Descriptors / Indicators: Aching;Grimacing;Sore Pain Intervention(s): Limited activity within patient's tolerance;Monitored during session;Repositioned;Patient requesting pain meds-RN notified    Home Living Family/patient expects to be discharged to:: Private residence Living Arrangements: Spouse/significant other;Children (87 yo daughter)  Available Help at Discharge: Family;Friend(s);Available PRN/intermittently Type of Home: House Home Access: Ramped entrance     Home Layout: One level Home Equipment: Walker - 2 wheels;Cane - single  point;Shower seat      Prior Function Level of Independence: Independent         Comments: reports out of work for past 3 months due to bilateral hip pain, runs a concrete company     Hand Dominance   Dominant Hand: Right    Extremity/Trunk Assessment   Upper Extremity Assessment Upper Extremity Assessment: Defer to OT evaluation    Lower Extremity Assessment Lower Extremity Assessment: RLE deficits/detail;LLE deficits/detail RLE Deficits / Details: grossly 3/5. pt unable to complete full ROM against gravity with MMT, reports pain-limiting. denies difference in sesnation. Full ROM at ankle, 3+/5. partial ROM at hip and knee RLE: Unable to fully assess due to pain LLE Deficits / Details: grossly 3/5. pt unable to complete full ROM against gravity with MMT, reports pain-limiting. denies difference in sesnation. Full ROM at ankle, 3+/5. partial ROM at hip and knee LLE: Unable to fully assess due to pain    Cervical / Trunk Assessment Cervical / Trunk Assessment: Normal  Communication   Communication: No difficulties  Cognition Arousal/Alertness: Awake/alert Behavior During Therapy: WFL for tasks assessed/performed Overall Cognitive Status: Within Functional Limits for tasks assessed                                 General Comments: tangential, fixated on returning to work ASAP to run his company      General Comments General comments (skin integrity, edema, etc.): VSS, pt with need for repeated cues for safety, slow progression to return to activity    Exercises Total Joint Exercises Ankle Circles/Pumps: AROM;Both;5 reps;Supine Quad Sets: AROM;Both;5 reps;Supine;Limitations Quad Sets Limitations: unable to maintain hold due to pain   Assessment/Plan    PT Assessment Patient needs continued PT services  PT Problem List Decreased strength;Decreased range of motion;Decreased activity tolerance;Decreased balance;Decreased coordination;Decreased  mobility;Decreased safety awareness       PT Treatment Interventions DME instruction;Gait training;Stair training;Functional mobility training;Therapeutic activities;Therapeutic exercise;Balance training;Patient/family education    PT Goals (Current goals can be found in the Care Plan section)  Acute Rehab PT Goals Patient Stated Goal: return to work asap PT Goal Formulation: With patient Time For Goal Achievement: 03/06/21 Potential to Achieve Goals: Good    Frequency 7X/week   Barriers to discharge Decreased caregiver support         AM-PAC PT "6 Clicks" Mobility  Outcome Measure Help needed turning from your back to your side while in a flat bed without using bedrails?: A Little Help needed moving from lying on your back to sitting on the side of a flat bed without using bedrails?: A Little Help needed moving to and from a bed to a chair (including a wheelchair)?: A Little Help needed standing up from a chair using your arms (e.g., wheelchair or bedside chair)?: A Little Help needed to walk in hospital room?: A Little Help needed climbing 3-5 steps with a railing? : A Lot 6 Click Score: 17    End of Session Equipment Utilized During Treatment: Gait belt Activity Tolerance: Patient limited by pain;Patient limited by fatigue Patient left: in bed;with call bell/phone within reach Nurse Communication: Mobility status;Patient requests pain meds PT Visit Diagnosis: Unsteadiness on feet (R26.81);Other abnormalities of gait and mobility (R26.89);Pain Pain - Right/Left: Left  Pain - part of body: Hip    Time: 9211-9417 PT Time Calculation (min) (ACUTE ONLY): 32 min   Charges:   PT Evaluation $PT Eval Low Complexity: 1 Low PT Treatments $Gait Training: 8-22 mins        Rolm Baptise, PT, DPT   Acute Rehabilitation Department Pager #: [phone]  Gaetana Michaelis 02/20/2021, 10:05 AM

## 2021-02-20 NOTE — Anesthesia Postprocedure Evaluation (Signed)
Anesthesia Post Note  Patient: Dean [name]  Procedure(s) Performed: LEFT TOTAL HIP ARTHROPLASTY ANTERIOR APPROACH (Left: Hip)     Patient location during evaluation: PACU Anesthesia Type: Spinal Level of consciousness: awake and alert Pain management: pain level controlled Vital Signs Assessment: post-procedure vital signs reviewed and stable Respiratory status: spontaneous breathing, nonlabored ventilation, respiratory function stable and patient connected to nasal cannula oxygen Cardiovascular status: blood pressure returned to baseline and stable Postop Assessment: spinal receding Anesthetic complications: no   No notable events documented.  Last Vitals:  Vitals:   02/20/21 0404 02/20/21 0723  BP: 138/75 130/69  Pulse: 65 73  Resp: 20 18  Temp: 36.4 C 36.9 C  SpO2: 97% 99%    Last Pain:  Vitals:   02/20/21 1000  TempSrc:   PainSc: 8                  Kennieth Rad

## 2021-02-20 NOTE — Progress Notes (Signed)
Subjective: 1 Day Post-Op Procedure(s) (LRB): LEFT TOTAL HIP ARTHROPLASTY ANTERIOR APPROACH (Left) Patient reports pain as mild.    Objective: Vital signs in last 24 hours: Temp:  [97.5 F (36.4 C)-98.4 F (36.9 C)] 98.4 F (36.9 C) (07/07 0723) Pulse Rate:  [41-73] 73 (07/07 0723) Resp:  [10-20] 18 (07/07 0723) BP: (96-138)/(45-83) 130/69 (07/07 0723) SpO2:  [93 %-99 %] 99 % (07/07 0723) Weight:  [191 kg] 107 kg (07/06 1020)  Intake/Output from previous day: 07/06 0701 - 07/07 0700 In: 1000 [I.V.:1000] Out: 2700 [Urine:2500; Blood:200] Intake/Output this shift: No intake/output data recorded.  Recent Labs    02/20/21 0531  HGB 11.2*   Recent Labs    02/20/21 0531  WBC 16.6*  RBC 3.62*  HCT 33.5*  PLT 274   Recent Labs    02/20/21 0531  NA 134*  K 4.4  CL 100  CO2 28  BUN 6  CREATININE 0.88  GLUCOSE 90  CALCIUM 8.5*   No results for input(s): LABPT, INR in the last 72 hours.  Neurologically intact Neurovascular intact Sensation intact distally Intact pulses distally Dorsiflexion/Plantar flexion intact Incision: dressing C/D/I No cellulitis present Compartment soft   Assessment/Plan: 1 Day Post-Op Procedure(s) (LRB): LEFT TOTAL HIP ARTHROPLASTY ANTERIOR APPROACH (Left) Advance diet Up with therapy D/C IV fluids Discharge home with home health as long as he is able to have help at home when d/c or is able to go to friends house WBAT LLE ABLA- mild and stable      Cristie Hem 02/20/2021, 7:55 AM

## 2021-02-20 NOTE — TOC Transition Note (Signed)
Transition of Care [facility]) - CM/SW Discharge Note   Patient Details  Name: Dean [name] MRN: [id number] Date of Birth: 03/05/1971  Transition of Care [facility]) CM/SW Contact:  Beckie Busing, RN Phone [phone]  02/20/2021, 11:08 AM   Clinical Narrative:    Patient has been previously set up with [facility] services through [facility]. Info has been added to avs. No other needs noted at this time. TOC will sign off.          Patient Goals and CMS Choice        Discharge Placement                       Discharge Plan and Services                                     Social Determinants of Health (SDOH) Interventions     Readmission Risk Interventions No flowsheet data found.

## 2021-02-24 ENCOUNTER — Telehealth: Payer: Self-pay

## 2021-02-24 MED ORDER — KETOROLAC TROMETHAMINE 10 MG PO TABS: 10.0000 mg | ORAL_TABLET | Freq: Two times a day (BID) | ORAL | 0 refills | Status: DC | PRN

## 2021-02-24 NOTE — Telephone Encounter (Signed)
Patient had surgery 02/19/21- L THA. States he is taking Oxycodone-Acetaminophen 1-2 tabs every 6 hours -has to double up. Unable to do HHPT. Having a lot of pain. He knows he just had SU but its super painful. Pain level (8/10). Elevates leg. Would like to know what to do.  CB: [phone]  [phone] [facility]

## 2021-02-24 NOTE — Addendum Note (Signed)
Addended by: Mayra Reel on: 02/24/2021 05:46 PM   Modules accepted: Orders

## 2021-02-24 NOTE — Telephone Encounter (Signed)
I sent toradol

## 2021-02-25 MED ORDER — OXYCODONE-ACETAMINOPHEN 5-325 MG PO TABS: 1.0000 | ORAL_TABLET | Freq: Four times a day (QID) | ORAL | 0 refills | Status: DC | PRN

## 2021-02-25 NOTE — Telephone Encounter (Signed)
IC advised submitted. He states he is running out of his pain meds. He said he is having to take 2 oxycodone every 4 hours for his family to tolerate his behavior that is triggered by the pain he is in.

## 2021-02-25 NOTE — Telephone Encounter (Signed)
I called patient and advised. He states that he is having to take 2 Oxycodone 5/325 q 4 hours in order to stay on top of this pain and he is going to run out. He requests refill. He has bought new chair, is elevating, and trying to walk but the pain is so bad he can hardly put his foot down. Please advise on pain med refill. I did explain to patient that toradol should help as well.

## 2021-02-25 NOTE — Telephone Encounter (Signed)
I refilled percocet 

## 2021-02-25 NOTE — Addendum Note (Signed)
Addended by: Mayra Reel on: 02/25/2021 06:07 PM   Modules accepted: Orders

## 2021-02-26 ENCOUNTER — Telehealth: Payer: Self-pay | Admitting: Orthopaedic Surgery

## 2021-02-26 ENCOUNTER — Encounter: Payer: Self-pay | Admitting: Orthopaedic Surgery

## 2021-02-26 ENCOUNTER — Telehealth: Payer: Self-pay

## 2021-02-26 NOTE — Telephone Encounter (Signed)
Vivia Budge from [facility] called he stated the patient is having a tremendous amount of pain patient stated he has not been able to sleep since he had his surgery patient also stated the rx that he was prescribed is not touching the pain call back:[phone]

## 2021-02-26 NOTE — Telephone Encounter (Signed)
Msg already routed to Dr.

## 2021-02-26 NOTE — Telephone Encounter (Signed)
Pt states he is in a lot of pain. He needs someone to call him as soon as they can.   CB [phone]

## 2021-02-27 ENCOUNTER — Telehealth: Payer: Self-pay | Admitting: Orthopaedic Surgery

## 2021-02-27 MED ORDER — HYDROMORPHONE HCL 2 MG PO TABS: 2.0000 mg | ORAL_TABLET | ORAL | 0 refills | Status: DC | PRN

## 2021-02-27 NOTE — Telephone Encounter (Signed)
I sent dilaudid.  He needs to stop taking the perococet.  Thanks.

## 2021-02-27 NOTE — Telephone Encounter (Signed)
Called and advised that we are cancelling percocet. Pharmacist stated understanding

## 2021-02-27 NOTE — Telephone Encounter (Signed)
Lvm informing pt and also informed therapist

## 2021-02-27 NOTE — Addendum Note (Signed)
Addended by: Mayra Reel on: 02/27/2021 06:45 AM   Modules accepted: Orders

## 2021-02-27 NOTE — Telephone Encounter (Signed)
Dwight with [facility] called stating the pt has been prescribed 2 different narcotics and he wanted to double check if the pt is supposed to get both. Karren Burly states first he had the percocet 5-325 mg called in and then a rx for dilaudid 2 mg was sent in. Karren Burly would like a CB to clarify.   [phone]

## 2021-03-03 ENCOUNTER — Telehealth: Payer: Self-pay | Admitting: Orthopaedic Surgery

## 2021-03-03 NOTE — Telephone Encounter (Signed)
Patient called advised  the medication Hydromorphone is not working. Patient said he is still in a lot of pain. Patient said he is not sleeping at all. The number to contact patient is [phone]

## 2021-03-03 NOTE — Telephone Encounter (Signed)
I can't prescribe anything stronger than that.  He needs to go to the ED if he's in that much pain.

## 2021-03-04 MED ORDER — OXYCODONE-ACETAMINOPHEN 10-325 MG PO TABS: 1.0000 | ORAL_TABLET | Freq: Three times a day (TID) | ORAL | 0 refills | Status: DC | PRN

## 2021-03-04 NOTE — Telephone Encounter (Signed)
Pt called and sated that the oxycodone was working pt stated it wanted to be on a 10-325.

## 2021-03-04 NOTE — Telephone Encounter (Signed)
done

## 2021-03-05 ENCOUNTER — Encounter: Payer: Self-pay | Admitting: Orthopaedic Surgery

## 2021-03-05 ENCOUNTER — Other Ambulatory Visit: Payer: Self-pay | Admitting: Physician Assistant

## 2021-03-05 ENCOUNTER — Ambulatory Visit (INDEPENDENT_AMBULATORY_CARE_PROVIDER_SITE_OTHER): Payer: Commercial Managed Care - PPO | Admitting: Physician Assistant

## 2021-03-05 VITALS — Ht 78.0 in | Wt 236.0 lb

## 2021-03-05 DIAGNOSIS — Z96642 Presence of left artificial hip joint: Secondary | ICD-10-CM

## 2021-03-05 MED ORDER — METHOCARBAMOL 750 MG PO TABS: 750.0000 mg | ORAL_TABLET | Freq: Three times a day (TID) | ORAL | 2 refills | Status: DC | PRN

## 2021-03-05 MED ORDER — ONDANSETRON HCL 4 MG PO TABS: 4.0000 mg | ORAL_TABLET | Freq: Three times a day (TID) | ORAL | 0 refills | Status: DC | PRN

## 2021-03-05 NOTE — Progress Notes (Unsigned)
Dean [name]

## 2021-03-05 NOTE — Progress Notes (Signed)
Post-Op Visit Note   Patient: Dean [name]           Date of Birth: February 13, 1971           MRN: [id number] Visit Date: 03/05/2021 PCP: Veverly Fells, MD   Assessment & Plan:  Chief Complaint:  Chief Complaint  Patient presents with   Left Hip - Follow-up    Left total hip arthroplasty 02/19/2021   Visit Diagnoses:  1. History of total left hip replacement     Plan: Patient is a 50 year old gentleman who comes in today 2 weeks out left total hip replacement, date of surgery 02/19/2021.  He has been in a lot of pain.  He has been taking oxycodone, Robaxin and Toradol without significant relief.  Of note, he was getting chronic oxycodone from his PCP prior to surgery.  He has been in physical therapy making slow progress.  He is ambulating with a walker.  Examination of his left hip reveals a well-healed surgical incision with nylon sutures in place.  He does have a very small seroma which is nontender.  No evidence of infection or cellulitis.  Calf soft nontender.  He is neurovascular intact distally.  Today, sutures were removed and Steri-Strips applied.  I refilled his Robaxin.  He has 5 remaining home health physical therapy sessions left and we will then transition him to outpatient physical therapy.  Referral has been made.  Follow-up with [place] in 4 weeks time for repeat evaluation and AP pelvis x-rays.  Dental prophylaxis reinforced.  Follow-Up Instructions: Return in about 4 weeks (around 04/02/2021).   Orders:  Orders Placed This Encounter  Procedures   Ambulatory referral to Physical Therapy   Meds ordered this encounter  Medications   methocarbamol (ROBAXIN) 750 MG tablet    Sig: Take 1 tablet (750 mg total) by mouth 3 (three) times daily as needed for muscle spasms.    Dispense:  60 tablet    Refill:  2    Imaging: No new imaging  PMFS History: Patient Active Problem List   Diagnosis Date Noted   Status post total replacement of left hip 02/19/2021   NSTEMI (non-ST  elevated myocardial infarction) (HCC) 11/14/2019   Stroke-like symptoms 05/18/2016   Smoker 05/18/2016   COPD (chronic obstructive pulmonary disease) (HCC) 05/18/2016   ETOH abuse 05/18/2016   Pulmonary nodule, right 05/18/2016   CAD-noted on chest CT 05/18/2016   Family history of coronary artery disease in father 05/18/2016   Cerebrovascular accident (CVA) due to thrombosis of right middle cerebral artery (HCC)    Mixed hyperlipidemia    Long term current use of anticoagulant 08/16/2015   Gastric catarrh 08/16/2015   History of biliary T-tube placement 08/16/2015   Personal history of other diseases of the circulatory system 08/16/2015   Polycythemia, secondary 08/16/2015   Paroxysmal atrial fibrillation (HCC) 07/18/2015   Morbid obesity (HCC) 07/18/2015   Essential hypertension 07/18/2015   Type 2 diabetes mellitus without complication, without long-term current use of insulin (HCC) 05/10/2014   History of PSVT (paroxysmal supraventricular tachycardia) 05/09/2014   WPW (Wolff-Parkinson-White syndrome) 05/09/2014   Avascular necrosis of bone of hip (HCC) 07/12/2013   Acid reflux 07/12/2013   Arthralgia of hip 07/12/2013   Chest pain of unknown etiology 05/01/2013   Past Medical History:  Diagnosis Date   Anxiety    Asthma    Atrial arrhythmia    multiples with 5-6 ablations   Atrial fibrillation with rapid ventricular response (HCC)  With rates up to 200 as well as short PR tachycardia, cycle length of 240 ms.   Chest tightness 05/08/2014   Diabetes mellitus, type II (HCC)    Dysrhythmia    GERD (gastroesophageal reflux disease)    Morbid obesity (HCC)    NSTEMI (non-ST elevated myocardial infarction) (HCC) 2021   Palpitations 05/08/2014   Pancreatitis    Pneumonia ~ 2010   Snores    Wil occasional apnea. Had a remote sleep study but does not remember results.   SOB (shortness of breath) 05/08/2014   SVT (supraventricular tachycardia) (HCC)    WPW  (Wolff-Parkinson-White syndrome)     Family History  Problem Relation Age of Onset   Clotting disorder Father    Diabetes Father    Heart attack Father 64   Hypertension Father     Past Surgical History:  Procedure Laterality Date   ATRIAL FIBRILLATION ABLATION     "& WPW"   CARDIAC CATHETERIZATION     "6 or 7" (05/18/2016)   CORONARY STENT INTERVENTION N/A 11/15/2019   Procedure: CORONARY STENT INTERVENTION;  Surgeon: Kathleene Hazel, MD;  Location: [facility];  Service: Cardiovascular;  Laterality: N/A;   FRACTURE SURGERY     LEFT HEART CATH AND CORONARY ANGIOGRAPHY N/A 11/15/2019   Procedure: LEFT HEART CATH AND CORONARY ANGIOGRAPHY;  Surgeon: Kathleene Hazel, MD;  Location: [facility];  Service: Cardiovascular;  Laterality: N/A;   TOTAL HIP ARTHROPLASTY Left 02/19/2021   Procedure: LEFT TOTAL HIP ARTHROPLASTY ANTERIOR APPROACH;  Surgeon: Tarry Kos, MD;  Location: [facility];  Service: Orthopedics;  Laterality: Left;  3-C   WRIST FRACTURE SURGERY Right 1990s   Social History   Occupational History   Not on file  Tobacco Use   Smoking status: Every Day    Packs/day: 1.00    Years: 30.00    Pack years: 30.00    Types: Cigarettes   Smokeless tobacco: Never  Vaping Use   Vaping Use: Never used  Substance and Sexual Activity   Alcohol use: No    Comment: none x 2 weeks   Drug use: No   Sexual activity: Yes

## 2021-03-10 ENCOUNTER — Other Ambulatory Visit: Payer: Self-pay | Admitting: Orthopaedic Surgery

## 2021-03-10 ENCOUNTER — Emergency Department (HOSPITAL_COMMUNITY)
Admission: EM | Admit: 2021-03-10 | Discharge: 2021-03-11 | Disposition: A | Discharge status: Home / Self Care | Payer: Commercial Managed Care - PPO | Attending: Physician Assistant | Admitting: Physician Assistant

## 2021-03-10 ENCOUNTER — Encounter (HOSPITAL_COMMUNITY): Payer: Self-pay

## 2021-03-10 DIAGNOSIS — M25559 Pain in unspecified hip: Secondary | ICD-10-CM | POA: Diagnosis not present

## 2021-03-10 DIAGNOSIS — T889XXA Complication of surgical and medical care, unspecified, initial encounter: Secondary | ICD-10-CM | POA: Diagnosis not present

## 2021-03-10 DIAGNOSIS — Z5321 Procedure and treatment not carried out due to patient leaving prior to being seen by health care provider: Secondary | ICD-10-CM | POA: Diagnosis not present

## 2021-03-10 DIAGNOSIS — R2240 Localized swelling, mass and lump, unspecified lower limb: Secondary | ICD-10-CM | POA: Insufficient documentation

## 2021-03-10 MED ORDER — OXYCODONE-ACETAMINOPHEN 5-325 MG PO TABS
2.0000 | ORAL_TABLET | Freq: Once | ORAL | Status: AC
  Administered 2021-03-10: 2 via ORAL
  Filled 2021-03-10: qty 2

## 2021-03-10 NOTE — ED Provider Notes (Signed)
Emergency Medicine Provider Triage Evaluation Note  Dean [name] , a 50 y.o. male  was evaluated in triage.  Pt complains of several days of severe left hip pain.  Reports that he recently had total hip replacement.  Records reviewed.  Patient had surgery on 02/19/2021 with Dr. Roda Shutters.  History of avascular necrosis.  Reports pain medicine at home is not helping his pain.  Additionally has some swelling of the left hip.  Denies fevers, chills, nausea or vomiting.  Patient denies new falls or trauma.  Last pain medication was 1 tablet of oxycodone at 4 PM.  Review of Systems  Positive: Left hip pain, left hip swelling Negative: Fever, chills, nausea, vomiting  Physical Exam  BP 130/73 (BP Location: Left Arm)   Pulse 72   Temp 98 F (36.7 C) (Oral)   Resp 14   SpO2 98%  Gen:   Awake, no distress   Resp:  Normal effort  MSK:   Decreased range of motion of the left hip Other:  Surgical site clean without erythema, induration or redness.  Palpable area of fluctuance.  Medical Decision Making  Medically screening exam initiated at 11:33 PM.  Appropriate orders placed.  Dean [name] was informed that the remainder of the evaluation will be completed by another provider, this initial triage assessment does not replace that evaluation, and the importance of remaining in the ED until their evaluation is complete.  Left hip pain, postop.   Saraya Tirey, Boyd Kerbs 03/10/21 [id number]    Glynn Octave, MD 03/11/21 [phone]

## 2021-03-10 NOTE — ED Triage Notes (Signed)
Pt reports that he had a hip replacement two weeks ago, had the stiches out several days ago and now has swelling (possible abscess) to the area, no redness. Denies fevers, pain is unbearable.

## 2021-03-11 ENCOUNTER — Other Ambulatory Visit: Payer: Self-pay | Admitting: Physician Assistant

## 2021-03-11 ENCOUNTER — Telehealth: Payer: Self-pay

## 2021-03-11 LAB — COMPREHENSIVE METABOLIC PANEL
ALT: 18 U/L (ref 0–44)
AST: 16 U/L (ref 15–41)
Albumin: 3.6 g/dL (ref 3.5–5.0)
Alkaline Phosphatase: 108 U/L (ref 38–126)
Anion gap: 9 (ref 5–15)
BUN: 10 mg/dL (ref 6–20)
CO2: 28 mmol/L (ref 22–32)
Calcium: 9.2 mg/dL (ref 8.9–10.3)
Chloride: 97 mmol/L — ABNORMAL LOW (ref 98–111)
Creatinine, Ser: 0.81 mg/dL (ref 0.61–1.24)
GFR, Estimated: >60 mL/min (ref >60)
Glucose, Bld: 94 mg/dL (ref 70–99)
Potassium: 4.1 mmol/L (ref 3.5–5.1)
Sodium: 134 mmol/L — ABNORMAL LOW (ref 135–145)
Total Bilirubin: 0.5 mg/dL (ref 0.3–1.2)
Total Protein: 6.8 g/dL (ref 6.5–8.1)

## 2021-03-11 LAB — CBC WITH DIFFERENTIAL/PLATELET
Abs Immature Granulocytes: 0.07 10*3/uL (ref 0.00–0.07)
Basophils Absolute: 0.1 10*3/uL (ref 0.0–0.1)
Basophils Relative: 1 %
Eosinophils Absolute: 0.3 10*3/uL (ref 0.0–0.5)
Eosinophils Relative: 2 %
HCT: 38.7 % — ABNORMAL LOW (ref 39.0–52.0)
Hemoglobin: 12.6 g/dL — ABNORMAL LOW (ref 13.0–17.0)
Immature Granulocytes: 1 %
Lymphocytes Relative: 25 %
Lymphs Abs: 3.6 10*3/uL (ref 0.7–4.0)
MCH: 30.7 pg (ref 26.0–34.0)
MCHC: 32.6 g/dL (ref 30.0–36.0)
MCV: 94.2 fL (ref 80.0–100.0)
Monocytes Absolute: 0.9 10*3/uL (ref 0.1–1.0)
Monocytes Relative: 6 %
Neutro Abs: 9.4 10*3/uL — ABNORMAL HIGH (ref 1.7–7.7)
Neutrophils Relative %: 65 %
Platelets: 555 10*3/uL — ABNORMAL HIGH (ref 150–400)
RBC: 4.11 MIL/uL — ABNORMAL LOW (ref 4.22–5.81)
RDW: 13.7 % (ref 11.5–15.5)
WBC: 14.2 10*3/uL — ABNORMAL HIGH (ref 4.0–10.5)
nRBC: 0 % (ref 0.0–0.2)

## 2021-03-11 LAB — LACTIC ACID, PLASMA: Lactic Acid, Venous: 0.9 mmol/L (ref 0.5–1.9)

## 2021-03-11 MED ORDER — OXYCODONE-ACETAMINOPHEN 5-325 MG PO TABS: 1.0000 | ORAL_TABLET | Freq: Four times a day (QID) | ORAL | 0 refills | Status: DC | PRN

## 2021-03-11 NOTE — ED Notes (Signed)
Patient left.

## 2021-03-11 NOTE — Telephone Encounter (Signed)
We have to start weaning down gradually

## 2021-03-11 NOTE — Telephone Encounter (Signed)
RX SENT TO PHARM PER LINDSEY

## 2021-03-11 NOTE — Telephone Encounter (Signed)
Patient called concerning Rx for pain.  I did advise patient that a Rx was sent to his pharmacy.  He would like a call back as to why the dosage was changed.  Cb# [phone].  Please advise.  Thank you.

## 2021-03-11 NOTE — Telephone Encounter (Signed)
Sent in

## 2021-03-12 ENCOUNTER — Other Ambulatory Visit: Payer: Self-pay | Admitting: Physician Assistant

## 2021-03-12 NOTE — Telephone Encounter (Signed)
Is he taking the ibuprofen 800mg  tid and robaxin in addition to pain meds?  Unfortunately, he has built up a tolerance from the pain meds he was taking prior to sx

## 2021-03-12 NOTE — Telephone Encounter (Signed)
yes

## 2021-03-12 NOTE — Telephone Encounter (Signed)
I talked to xu.  Ok to write for 10 mg once more, but he must bring back the 5mg  that I wrote on 7/26 before we can do this

## 2021-03-14 ENCOUNTER — Other Ambulatory Visit: Payer: Self-pay | Admitting: Physician Assistant

## 2021-03-14 MED ORDER — OXYCODONE-ACETAMINOPHEN 10-325 MG PO TABS: ORAL_TABLET | ORAL | 0 refills | Status: DC

## 2021-03-14 NOTE — Telephone Encounter (Signed)
States he never picked up Rx 03/11/2021. Would like 10mg .

## 2021-03-14 NOTE — Telephone Encounter (Signed)
He was supposed to bring back his pills in order to get new rx.  I will call in to [facility] as I am no longer in the office.

## 2021-03-14 NOTE — Telephone Encounter (Signed)
I called patient and advised. He wanted for you to know that he went to the ED by EMS due to extreme groin pain the other day. He waited x 6 hours and left. He also states that the "knot" is still there and has only gone down a slight bit.  FYI

## 2021-03-17 NOTE — Telephone Encounter (Signed)
Ok, thanks.  Continue to ice hip

## 2021-03-27 ENCOUNTER — Other Ambulatory Visit: Payer: Self-pay | Admitting: Physician Assistant

## 2021-03-27 ENCOUNTER — Telehealth: Payer: Self-pay | Admitting: Orthopaedic Surgery

## 2021-03-27 ENCOUNTER — Encounter: Payer: Self-pay | Admitting: Orthopaedic Surgery

## 2021-03-27 MED ORDER — OXYCODONE-ACETAMINOPHEN 5-325 MG PO TABS: 1.0000 | ORAL_TABLET | Freq: Two times a day (BID) | ORAL | 0 refills | Status: DC | PRN

## 2021-03-27 NOTE — Telephone Encounter (Signed)
Need to start weaning down so I sent in 1-2 bid.  Will need to wean to norco soon

## 2021-03-27 NOTE — Telephone Encounter (Signed)
Pt called stating he needs a refill of his oxycodone rx; and he states because he's doing PT he has to take 2 to ease his pain. Pt would like a CB when that's been called in.  [phone]

## 2021-03-29 ENCOUNTER — Other Ambulatory Visit: Payer: Self-pay | Admitting: Orthopaedic Surgery

## 2021-03-29 MED ORDER — OXYCODONE-ACETAMINOPHEN 10-325 MG PO TABS: 1.0000 | ORAL_TABLET | Freq: Two times a day (BID) | ORAL | 0 refills | Status: DC | PRN

## 2021-04-10 ENCOUNTER — Other Ambulatory Visit: Payer: Self-pay | Admitting: Orthopaedic Surgery

## 2021-04-10 ENCOUNTER — Telehealth: Payer: Self-pay | Admitting: Orthopaedic Surgery

## 2021-04-10 NOTE — Telephone Encounter (Signed)
Pt called requesting refill of 10 mg oxycodone. Please send to pharmacy on file. Pt also states that physical therapist states hip is getting worse and need to consider surgery on that hip also. Please call pt at [phone].

## 2021-04-10 NOTE — Telephone Encounter (Signed)
This is a Xu pt.  

## 2021-04-11 ENCOUNTER — Other Ambulatory Visit: Payer: Self-pay | Admitting: Physician Assistant

## 2021-04-11 MED ORDER — HYDROCODONE-ACETAMINOPHEN 5-325 MG PO TABS: 1.0000 | ORAL_TABLET | Freq: Two times a day (BID) | ORAL | 0 refills | Status: DC | PRN

## 2021-04-11 NOTE — Telephone Encounter (Signed)
Sent mychart msg.

## 2021-04-11 NOTE — Telephone Encounter (Signed)
Need to wean to norco this far out from surgery.  We don't treat chronic avn of the other hip with pain meds.  Needs to have surgery for that, but needs to recover from previous hip replacement first

## 2021-04-11 NOTE — Telephone Encounter (Signed)
Pt wife called and states she thinks the hydrocodone isn't going to work for pt. Can you please give her a call.  They're about to go out of town in a few hours can you please call whenever you g et a chance.  CB [phone]

## 2021-04-11 NOTE — Telephone Encounter (Signed)
This was also discussed with Dr. Roda Shutters.  He can add 800mg  ibuprofen every 8 hours if needed

## 2021-06-11 ENCOUNTER — Telehealth: Payer: Self-pay | Admitting: *Deleted

## 2021-06-11 NOTE — Telephone Encounter (Signed)
Our office received a clearance request yesterday 06/10/21 @ 10:21 am. I have just returned to the office today after being unavailable. I have called the dental office and left message per the request that was sent over that we are going to need to confirm the procedure. Will need to know procedure being done, as well as if any teeth being extracted, then how many teeth being extracted. I will place the clearance request in and will await the call back from the dental office with complete information before forwarding to pre op pool.     Pre-operative Risk Assessment    Patient Name: Dean [name]  DOB: 07-07-1971 MRN: [id number]     Request for Surgical Clearance    Procedure:   LEFT MESSAGE FOR DENTAL OFFICE TO CALL BACK AND CONFIRM PROCEDURE, IF ANY TEETH BEING EXTRACTED, THEN HOW MANY, SEE NOTE ABOVE.  Date of Surgery: Clearance TBD                                 Surgeon:  DR. Randell Loop, DDS Surgeon's Group or Practice Name:  [facility] Phone number:  [phone] Fax number:  [phone]   Type of Clearance Requested: - Medical  - Pharmacy:  Hold Aspirin     Type of Anesthesia:   Local /NEEDS TO KNOW IF OK TO USE EPINEPHRINE IF NEEDED   Additional requests/questions: Does this patient need antibiotics?  Elpidio Anis   06/11/2021, 4:18 PM

## 2021-06-12 NOTE — Telephone Encounter (Signed)
I s/w the dental office who stated the pt is coming in as a consult at this time and jot sure of Tx plan yet. I advised we cannot provider a blanket type clearance. I advised what they will need to do after the consults and the DDS has a Tx plan for the pt, they will need to fax over a new clearance with the Tx plan, anesthesia to be used, meds to be held, if any. If the Tx plan will involve multiple Tx plan at different stages, we will then need a clearance request to be faxed to our office when ready for each Tx plan. Dental office gave verbal understanding to plan of care and our pre op protocol.  I will fax these notes over to their office today.

## 2021-11-11 ENCOUNTER — Emergency Department (HOSPITAL_COMMUNITY): Payer: Commercial Managed Care - PPO | Admitting: Anesthesiology

## 2021-11-11 ENCOUNTER — Other Ambulatory Visit: Payer: Self-pay

## 2021-11-11 ENCOUNTER — Encounter (HOSPITAL_COMMUNITY): Admission: EM | Disposition: A | Discharge status: Home / Self Care | Payer: Self-pay | Source: Home / Self Care

## 2021-11-11 ENCOUNTER — Inpatient Hospital Stay (HOSPITAL_BASED_OUTPATIENT_CLINIC_OR_DEPARTMENT_OTHER)
Admission: EM | Admit: 2021-11-11 | Discharge: 2021-11-14 | DRG: 622 | Disposition: A | Discharge status: Home / Self Care | Payer: Commercial Managed Care - PPO | Attending: Student | Admitting: Student

## 2021-11-11 ENCOUNTER — Emergency Department (HOSPITAL_BASED_OUTPATIENT_CLINIC_OR_DEPARTMENT_OTHER): Payer: Commercial Managed Care - PPO

## 2021-11-11 ENCOUNTER — Encounter (HOSPITAL_BASED_OUTPATIENT_CLINIC_OR_DEPARTMENT_OTHER): Payer: Self-pay | Admitting: *Deleted

## 2021-11-11 DIAGNOSIS — I480 Paroxysmal atrial fibrillation: Secondary | ICD-10-CM | POA: Diagnosis not present

## 2021-11-11 DIAGNOSIS — Z888 Allergy status to other drugs, medicaments and biological substances status: Secondary | ICD-10-CM

## 2021-11-11 DIAGNOSIS — E119 Type 2 diabetes mellitus without complications: Secondary | ICD-10-CM

## 2021-11-11 DIAGNOSIS — I252 Old myocardial infarction: Secondary | ICD-10-CM

## 2021-11-11 DIAGNOSIS — F419 Anxiety disorder, unspecified: Secondary | ICD-10-CM

## 2021-11-11 DIAGNOSIS — K610 Anal abscess: Secondary | ICD-10-CM | POA: Diagnosis not present

## 2021-11-11 DIAGNOSIS — E871 Hypo-osmolality and hyponatremia: Secondary | ICD-10-CM

## 2021-11-11 DIAGNOSIS — Z20822 Contact with and (suspected) exposure to covid-19: Secondary | ICD-10-CM | POA: Diagnosis present

## 2021-11-11 DIAGNOSIS — Z7982 Long term (current) use of aspirin: Secondary | ICD-10-CM

## 2021-11-11 DIAGNOSIS — I2583 Coronary atherosclerosis due to lipid rich plaque: Secondary | ICD-10-CM

## 2021-11-11 DIAGNOSIS — L0231 Cutaneous abscess of buttock: Secondary | ICD-10-CM | POA: Diagnosis present

## 2021-11-11 DIAGNOSIS — I2510 Atherosclerotic heart disease of native coronary artery without angina pectoris: Secondary | ICD-10-CM

## 2021-11-11 DIAGNOSIS — Z8673 Personal history of transient ischemic attack (TIA), and cerebral infarction without residual deficits: Secondary | ICD-10-CM | POA: Diagnosis not present

## 2021-11-11 DIAGNOSIS — Z833 Family history of diabetes mellitus: Secondary | ICD-10-CM

## 2021-11-11 DIAGNOSIS — K219 Gastro-esophageal reflux disease without esophagitis: Secondary | ICD-10-CM | POA: Diagnosis not present

## 2021-11-11 DIAGNOSIS — G8929 Other chronic pain: Secondary | ICD-10-CM | POA: Diagnosis not present

## 2021-11-11 DIAGNOSIS — I471 Supraventricular tachycardia: Secondary | ICD-10-CM | POA: Diagnosis not present

## 2021-11-11 DIAGNOSIS — Z7901 Long term (current) use of anticoagulants: Secondary | ICD-10-CM

## 2021-11-11 DIAGNOSIS — R338 Other retention of urine: Secondary | ICD-10-CM

## 2021-11-11 DIAGNOSIS — Z7952 Long term (current) use of systemic steroids: Secondary | ICD-10-CM

## 2021-11-11 DIAGNOSIS — Z96642 Presence of left artificial hip joint: Secondary | ICD-10-CM | POA: Diagnosis present

## 2021-11-11 DIAGNOSIS — K612 Anorectal abscess: Secondary | ICD-10-CM | POA: Diagnosis present

## 2021-11-11 DIAGNOSIS — F17210 Nicotine dependence, cigarettes, uncomplicated: Secondary | ICD-10-CM | POA: Diagnosis present

## 2021-11-11 DIAGNOSIS — B952 Enterococcus as the cause of diseases classified elsewhere: Secondary | ICD-10-CM | POA: Diagnosis present

## 2021-11-11 DIAGNOSIS — R339 Retention of urine, unspecified: Secondary | ICD-10-CM | POA: Diagnosis present

## 2021-11-11 DIAGNOSIS — L03317 Cellulitis of buttock: Secondary | ICD-10-CM | POA: Diagnosis present

## 2021-11-11 DIAGNOSIS — Z6827 Body mass index (BMI) 27.0-27.9, adult: Secondary | ICD-10-CM

## 2021-11-11 DIAGNOSIS — E785 Hyperlipidemia, unspecified: Secondary | ICD-10-CM | POA: Diagnosis present

## 2021-11-11 DIAGNOSIS — Z794 Long term (current) use of insulin: Secondary | ICD-10-CM

## 2021-11-11 DIAGNOSIS — N493 Fournier gangrene: Secondary | ICD-10-CM

## 2021-11-11 DIAGNOSIS — J449 Chronic obstructive pulmonary disease, unspecified: Secondary | ICD-10-CM | POA: Diagnosis present

## 2021-11-11 DIAGNOSIS — E538 Deficiency of other specified B group vitamins: Secondary | ICD-10-CM

## 2021-11-11 DIAGNOSIS — E1152 Type 2 diabetes mellitus with diabetic peripheral angiopathy with gangrene: Secondary | ICD-10-CM | POA: Diagnosis present

## 2021-11-11 DIAGNOSIS — Z79899 Other long term (current) drug therapy: Secondary | ICD-10-CM

## 2021-11-11 DIAGNOSIS — D649 Anemia, unspecified: Secondary | ICD-10-CM | POA: Diagnosis not present

## 2021-11-11 DIAGNOSIS — Z8701 Personal history of pneumonia (recurrent): Secondary | ICD-10-CM

## 2021-11-11 DIAGNOSIS — I10 Essential (primary) hypertension: Secondary | ICD-10-CM | POA: Diagnosis present

## 2021-11-11 DIAGNOSIS — E1169 Type 2 diabetes mellitus with other specified complication: Secondary | ICD-10-CM | POA: Diagnosis not present

## 2021-11-11 DIAGNOSIS — K6289 Other specified diseases of anus and rectum: Secondary | ICD-10-CM | POA: Diagnosis present

## 2021-11-11 DIAGNOSIS — I456 Pre-excitation syndrome: Secondary | ICD-10-CM | POA: Diagnosis present

## 2021-11-11 DIAGNOSIS — M726 Necrotizing fasciitis: Secondary | ICD-10-CM

## 2021-11-11 DIAGNOSIS — Z7984 Long term (current) use of oral hypoglycemic drugs: Secondary | ICD-10-CM

## 2021-11-11 DIAGNOSIS — Z8679 Personal history of other diseases of the circulatory system: Secondary | ICD-10-CM

## 2021-11-11 DIAGNOSIS — Z832 Family history of diseases of the blood and blood-forming organs and certain disorders involving the immune mechanism: Secondary | ICD-10-CM

## 2021-11-11 DIAGNOSIS — Z8249 Family history of ischemic heart disease and other diseases of the circulatory system: Secondary | ICD-10-CM

## 2021-11-11 DIAGNOSIS — Z955 Presence of coronary angioplasty implant and graft: Secondary | ICD-10-CM

## 2021-11-11 DIAGNOSIS — Z7902 Long term (current) use of antithrombotics/antiplatelets: Secondary | ICD-10-CM | POA: Diagnosis not present

## 2021-11-11 DIAGNOSIS — F17200 Nicotine dependence, unspecified, uncomplicated: Secondary | ICD-10-CM

## 2021-11-11 HISTORY — PX: INCISION AND DRAINAGE ABSCESS: SHX5864

## 2021-11-11 LAB — CBC WITH DIFFERENTIAL/PLATELET
Abs Immature Granulocytes: 0.19 10*3/uL — ABNORMAL HIGH (ref 0.00–0.07)
Basophils Absolute: 0.1 10*3/uL (ref 0.0–0.1)
Basophils Relative: 0 %
Eosinophils Absolute: 0.1 10*3/uL (ref 0.0–0.5)
Eosinophils Relative: 1 %
HCT: 40 % (ref 39.0–52.0)
Hemoglobin: 13.7 g/dL (ref 13.0–17.0)
Immature Granulocytes: 1 %
Lymphocytes Relative: 7 %
Lymphs Abs: 1.6 10*3/uL (ref 0.7–4.0)
MCH: 31.1 pg (ref 26.0–34.0)
MCHC: 34.3 g/dL (ref 30.0–36.0)
MCV: 90.7 fL (ref 80.0–100.0)
Monocytes Absolute: 1.5 10*3/uL — ABNORMAL HIGH (ref 0.1–1.0)
Monocytes Relative: 6 %
Neutro Abs: 21 10*3/uL — ABNORMAL HIGH (ref 1.7–7.7)
Neutrophils Relative %: 85 %
Platelets: 389 10*3/uL (ref 150–400)
RBC: 4.41 MIL/uL (ref 4.22–5.81)
RDW: 12.4 % (ref 11.5–15.5)
WBC: 24.5 10*3/uL — ABNORMAL HIGH (ref 4.0–10.5)
nRBC: 0 % (ref 0.0–0.2)

## 2021-11-11 LAB — GLUCOSE, CAPILLARY
Glucose-Capillary: 172 mg/dL — ABNORMAL HIGH (ref 70–99)
Glucose-Capillary: 150 mg/dL — ABNORMAL HIGH (ref 70–99)
Glucose-Capillary: 315 mg/dL — ABNORMAL HIGH (ref 70–99)

## 2021-11-11 LAB — BASIC METABOLIC PANEL
Anion gap: 8 (ref 5–15)
BUN: 15 mg/dL (ref 6–20)
CO2: 31 mmol/L (ref 22–32)
Calcium: 8.6 mg/dL — ABNORMAL LOW (ref 8.9–10.3)
Chloride: 94 mmol/L — ABNORMAL LOW (ref 98–111)
Creatinine, Ser: 1.19 mg/dL (ref 0.61–1.24)
GFR, Estimated: >60 mL/min (ref >60)
Glucose, Bld: 143 mg/dL — ABNORMAL HIGH (ref 70–99)
Potassium: 4.6 mmol/L (ref 3.5–5.1)
Sodium: 133 mmol/L — ABNORMAL LOW (ref 135–145)

## 2021-11-11 LAB — HEMOGLOBIN A1C
Hgb A1c MFr Bld: 8.4 % — ABNORMAL HIGH (ref 4.8–5.6)
Hgb A1c MFr Bld: 8.4 % — ABNORMAL HIGH (ref 4.8–5.6)
Mean Plasma Glucose: 194.38 mg/dL
Mean Plasma Glucose: 194.38 mg/dL

## 2021-11-11 LAB — LACTIC ACID, PLASMA: Lactic Acid, Venous: 1.5 mmol/L (ref 0.5–1.9)

## 2021-11-11 SURGERY — INCISION AND DRAINAGE, ABSCESS
Anesthesia: General | Laterality: Left

## 2021-11-11 MED ORDER — ONDANSETRON HCL 4 MG/2ML IJ SOLN
INTRAMUSCULAR | Status: AC
  Filled 2021-11-11: qty 2

## 2021-11-11 MED ORDER — ONDANSETRON HCL 4 MG/2ML IJ SOLN
INTRAMUSCULAR | Status: DC | PRN
  Administered 2021-11-11: 4 mg via INTRAVENOUS

## 2021-11-11 MED ORDER — PROPOFOL 10 MG/ML IV BOLUS
INTRAVENOUS | Status: AC
  Filled 2021-11-11: qty 40

## 2021-11-11 MED ORDER — OXYCODONE HCL 5 MG PO TABS
5.0000 mg | ORAL_TABLET | Freq: Once | ORAL | Status: AC | PRN
  Administered 2021-11-11: 5 mg via ORAL

## 2021-11-11 MED ORDER — GABAPENTIN 300 MG PO CAPS: 600.0000 mg | ORAL_CAPSULE | ORAL | Status: DC

## 2021-11-11 MED ORDER — HYDROMORPHONE HCL 1 MG/ML IJ SOLN
0.5000 mg | Freq: Once | INTRAMUSCULAR | Status: AC
  Administered 2021-11-11: 0.5 mg via INTRAVENOUS
  Filled 2021-11-11: qty 1

## 2021-11-11 MED ORDER — ALBUTEROL SULFATE HFA 108 (90 BASE) MCG/ACT IN AERS
INHALATION_SPRAY | RESPIRATORY_TRACT | Status: AC
  Filled 2021-11-11: qty 6.7

## 2021-11-11 MED ORDER — VANCOMYCIN HCL 1500 MG/300ML IV SOLN
1500.0000 mg | Freq: Two times a day (BID) | INTRAVENOUS | Status: DC
  Administered 2021-11-12 – 2021-11-14 (×5): 1500 mg via INTRAVENOUS
  Filled 2021-11-11 (×5): qty 300

## 2021-11-11 MED ORDER — METHOCARBAMOL 500 MG PO TABS: 500.0000 mg | ORAL_TABLET | Freq: Four times a day (QID) | ORAL | Status: DC | PRN

## 2021-11-11 MED ORDER — SODIUM CHLORIDE 0.9 % IV BOLUS
1000.0000 mL | Freq: Once | INTRAVENOUS | Status: AC
  Administered 2021-11-11: 1000 mL via INTRAVENOUS

## 2021-11-11 MED ORDER — INSULIN ASPART 100 UNIT/ML IJ SOLN
0.0000 [IU] | INTRAMUSCULAR | Status: DC
  Administered 2021-11-11: 11 [IU] via SUBCUTANEOUS
  Administered 2021-11-12: 5 [IU] via SUBCUTANEOUS
  Administered 2021-11-12: 2 [IU] via SUBCUTANEOUS
  Administered 2021-11-12 (×2): 3 [IU] via SUBCUTANEOUS
  Administered 2021-11-13: 2 [IU] via SUBCUTANEOUS
  Administered 2021-11-13 (×2): 3 [IU] via SUBCUTANEOUS

## 2021-11-11 MED ORDER — ACETAMINOPHEN 650 MG RE SUPP: 650.0000 mg | Freq: Four times a day (QID) | RECTAL | Status: DC | PRN

## 2021-11-11 MED ORDER — VANCOMYCIN HCL 2000 MG/400ML IV SOLN
2000.0000 mg | Freq: Once | INTRAVENOUS | Status: AC
  Administered 2021-11-11: 2000 mg via INTRAVENOUS
  Filled 2021-11-11: qty 400

## 2021-11-11 MED ORDER — HYDROMORPHONE HCL 1 MG/ML IJ SOLN
1.0000 mg | INTRAMUSCULAR | Status: DC | PRN
  Administered 2021-11-11 – 2021-11-13 (×11): 1 mg via INTRAVENOUS
  Filled 2021-11-11 (×10): qty 1

## 2021-11-11 MED ORDER — ACETAMINOPHEN 325 MG PO TABS
650.0000 mg | ORAL_TABLET | Freq: Four times a day (QID) | ORAL | Status: DC | PRN
  Filled 2021-11-11: qty 2

## 2021-11-11 MED ORDER — FENTANYL CITRATE (PF) 100 MCG/2ML IJ SOLN
INTRAMUSCULAR | Status: DC | PRN
  Administered 2021-11-11: 100 ug via INTRAVENOUS
  Administered 2021-11-11: 50 ug via INTRAVENOUS
  Administered 2021-11-11 (×2): 100 ug via INTRAVENOUS
  Administered 2021-11-11: 150 ug via INTRAVENOUS

## 2021-11-11 MED ORDER — LACTATED RINGERS IV SOLN: INTRAVENOUS | Status: DC | PRN

## 2021-11-11 MED ORDER — PANTOPRAZOLE SODIUM 40 MG IV SOLR
40.0000 mg | Freq: Every day | INTRAVENOUS | Status: DC
  Administered 2021-11-11: 40 mg via INTRAVENOUS
  Filled 2021-11-11: qty 10

## 2021-11-11 MED ORDER — MIDAZOLAM HCL 2 MG/2ML IJ SOLN
INTRAMUSCULAR | Status: AC
  Filled 2021-11-11: qty 2

## 2021-11-11 MED ORDER — MONTELUKAST SODIUM 10 MG PO TABS
10.0000 mg | ORAL_TABLET | Freq: Every day | ORAL | Status: DC
  Administered 2021-11-12 – 2021-11-13 (×2): 10 mg via ORAL
  Filled 2021-11-11 (×2): qty 1

## 2021-11-11 MED ORDER — IOHEXOL 300 MG/ML  SOLN
100.0000 mL | Freq: Once | INTRAMUSCULAR | Status: AC | PRN
  Administered 2021-11-11: 100 mL via INTRAVENOUS

## 2021-11-11 MED ORDER — POLYETHYLENE GLYCOL 3350 17 G PO PACK: 17.0000 g | PACK | Freq: Every day | ORAL | Status: DC | PRN

## 2021-11-11 MED ORDER — SODIUM CHLORIDE 0.9 % IV SOLN: INTRAVENOUS | Status: DC

## 2021-11-11 MED ORDER — PHENYLEPHRINE HCL (PRESSORS) 10 MG/ML IV SOLN
INTRAVENOUS | Status: AC
  Filled 2021-11-11: qty 2

## 2021-11-11 MED ORDER — OXYCODONE HCL 5 MG PO TABS
ORAL_TABLET | ORAL | Status: AC
  Filled 2021-11-11: qty 1

## 2021-11-11 MED ORDER — SUCCINYLCHOLINE CHLORIDE 200 MG/10ML IV SOSY
PREFILLED_SYRINGE | INTRAVENOUS | Status: DC | PRN
  Administered 2021-11-11: 140 mg via INTRAVENOUS

## 2021-11-11 MED ORDER — ISOSORBIDE MONONITRATE ER 60 MG PO TB24
60.0000 mg | ORAL_TABLET | Freq: Every day | ORAL | Status: DC
  Administered 2021-11-12 – 2021-11-14 (×3): 60 mg via ORAL
  Filled 2021-11-11 (×3): qty 1

## 2021-11-11 MED ORDER — ONDANSETRON HCL 4 MG/2ML IJ SOLN: 4.0000 mg | Freq: Once | INTRAMUSCULAR | Status: DC | PRN

## 2021-11-11 MED ORDER — OXYCODONE HCL 5 MG/5ML PO SOLN: 5.0000 mg | Freq: Once | ORAL | Status: AC | PRN

## 2021-11-11 MED ORDER — PIPERACILLIN-TAZOBACTAM 3.375 G IVPB 30 MIN
3.3750 g | Freq: Once | INTRAVENOUS | Status: AC
  Administered 2021-11-11: 3.375 g via INTRAVENOUS
  Filled 2021-11-11: qty 50

## 2021-11-11 MED ORDER — ONDANSETRON 4 MG PO TBDP
4.0000 mg | ORAL_TABLET | Freq: Four times a day (QID) | ORAL | Status: DC | PRN
Start: 2021-11-11 — End: 2021-11-14

## 2021-11-11 MED ORDER — MIDAZOLAM HCL 5 MG/5ML IJ SOLN
INTRAMUSCULAR | Status: DC | PRN
Start: 2021-11-11 — End: 2021-11-11
  Administered 2021-11-11: 2 mg via INTRAVENOUS

## 2021-11-11 MED ORDER — AMIODARONE HCL 200 MG PO TABS
200.0000 mg | ORAL_TABLET | Freq: Every day | ORAL | Status: DC
  Administered 2021-11-12 – 2021-11-14 (×3): 200 mg via ORAL
  Filled 2021-11-11 (×3): qty 1

## 2021-11-11 MED ORDER — ALBUTEROL SULFATE (2.5 MG/3ML) 0.083% IN NEBU: 2.5000 mg | INHALATION_SOLUTION | Freq: Four times a day (QID) | RESPIRATORY_TRACT | Status: DC | PRN

## 2021-11-11 MED ORDER — HYDROMORPHONE HCL 1 MG/ML IJ SOLN
0.2500 mg | INTRAMUSCULAR | Status: DC | PRN
  Administered 2021-11-11 (×2): 0.5 mg via INTRAVENOUS

## 2021-11-11 MED ORDER — PROPOFOL 500 MG/50ML IV EMUL
INTRAVENOUS | Status: DC | PRN
  Administered 2021-11-11: 50 ug/kg/min via INTRAVENOUS

## 2021-11-11 MED ORDER — METHOCARBAMOL 500 MG PO TABS
750.0000 mg | ORAL_TABLET | Freq: Four times a day (QID) | ORAL | Status: DC | PRN
  Administered 2021-11-12 (×2): 750 mg via ORAL
  Filled 2021-11-11 (×3): qty 2

## 2021-11-11 MED ORDER — FENTANYL CITRATE (PF) 250 MCG/5ML IJ SOLN
INTRAMUSCULAR | Status: AC
  Filled 2021-11-11: qty 5

## 2021-11-11 MED ORDER — PROPOFOL 500 MG/50ML IV EMUL
INTRAVENOUS | Status: AC
  Filled 2021-11-11: qty 100

## 2021-11-11 MED ORDER — DILTIAZEM HCL ER COATED BEADS 120 MG PO CP24
240.0000 mg | ORAL_CAPSULE | Freq: Every day | ORAL | Status: DC
  Administered 2021-11-12 – 2021-11-14 (×3): 240 mg via ORAL
  Filled 2021-11-11 (×3): qty 2

## 2021-11-11 MED ORDER — ALPRAZOLAM 1 MG PO TABS
2.0000 mg | ORAL_TABLET | Freq: Three times a day (TID) | ORAL | Status: DC | PRN
  Administered 2021-11-12 – 2021-11-14 (×5): 2 mg via ORAL
  Filled 2021-11-11 (×5): qty 2

## 2021-11-11 MED ORDER — DOCUSATE SODIUM 100 MG PO CAPS
100.0000 mg | ORAL_CAPSULE | Freq: Two times a day (BID) | ORAL | Status: DC
  Administered 2021-11-11 – 2021-11-14 (×6): 100 mg via ORAL
  Filled 2021-11-11 (×6): qty 1

## 2021-11-11 MED ORDER — HYDROMORPHONE HCL 1 MG/ML IJ SOLN
INTRAMUSCULAR | Status: AC
  Filled 2021-11-11: qty 2

## 2021-11-11 MED ORDER — ENOXAPARIN SODIUM 40 MG/0.4ML IJ SOSY
40.0000 mg | PREFILLED_SYRINGE | INTRAMUSCULAR | Status: DC
  Administered 2021-11-12: 40 mg via SUBCUTANEOUS
  Filled 2021-11-11: qty 0.4

## 2021-11-11 MED ORDER — LACTATED RINGERS IV SOLN: INTRAVENOUS | Status: DC

## 2021-11-11 MED ORDER — SIMETHICONE 80 MG PO CHEW
40.0000 mg | CHEWABLE_TABLET | Freq: Four times a day (QID) | ORAL | Status: DC | PRN
  Administered 2021-11-13: 40 mg via ORAL
  Filled 2021-11-11 (×3): qty 1

## 2021-11-11 MED ORDER — DIPHENHYDRAMINE HCL 50 MG/ML IJ SOLN: 25.0000 mg | Freq: Four times a day (QID) | INTRAMUSCULAR | Status: DC | PRN

## 2021-11-11 MED ORDER — LEVALBUTEROL HCL 1.25 MG/3ML IN NEBU: 1.2500 mg | INHALATION_SOLUTION | Freq: Four times a day (QID) | RESPIRATORY_TRACT | Status: DC | PRN

## 2021-11-11 MED ORDER — ONDANSETRON HCL 4 MG/2ML IJ SOLN: 4.0000 mg | Freq: Four times a day (QID) | INTRAMUSCULAR | Status: DC | PRN

## 2021-11-11 MED ORDER — METOPROLOL TARTRATE 5 MG/5ML IV SOLN: 5.0000 mg | Freq: Four times a day (QID) | INTRAVENOUS | Status: DC | PRN

## 2021-11-11 MED ORDER — BISOPROLOL FUMARATE 5 MG PO TABS
5.0000 mg | ORAL_TABLET | Freq: Every day | ORAL | Status: DC
  Administered 2021-11-12 – 2021-11-14 (×3): 5 mg via ORAL
  Filled 2021-11-11 (×3): qty 1

## 2021-11-11 MED ORDER — PROPOFOL 10 MG/ML IV BOLUS
INTRAVENOUS | Status: DC | PRN
  Administered 2021-11-11: 200 mg via INTRAVENOUS

## 2021-11-11 MED ORDER — CLINDAMYCIN PHOSPHATE 900 MG/50ML IV SOLN
900.0000 mg | Freq: Three times a day (TID) | INTRAVENOUS | Status: DC
Start: 2021-11-11 — End: 2021-11-14
  Administered 2021-11-11 – 2021-11-14 (×8): 900 mg via INTRAVENOUS
  Filled 2021-11-11 (×9): qty 50

## 2021-11-11 MED ORDER — MORPHINE SULFATE (PF) 4 MG/ML IV SOLN
4.0000 mg | Freq: Once | INTRAVENOUS | Status: AC
  Administered 2021-11-11: 4 mg via INTRAVENOUS
  Filled 2021-11-11: qty 1

## 2021-11-11 MED ORDER — DEXMEDETOMIDINE (PRECEDEX) IN NS 20 MCG/5ML (4 MCG/ML) IV SYRINGE
PREFILLED_SYRINGE | INTRAVENOUS | Status: DC | PRN
  Administered 2021-11-11 (×2): 8 ug via INTRAVENOUS

## 2021-11-11 MED ORDER — MOMETASONE FURO-FORMOTEROL FUM 200-5 MCG/ACT IN AERO
2.0000 | INHALATION_SPRAY | Freq: Two times a day (BID) | RESPIRATORY_TRACT | Status: DC
Start: 2021-11-11 — End: 2021-11-14
  Administered 2021-11-12 – 2021-11-14 (×5): 2 via RESPIRATORY_TRACT
  Filled 2021-11-11: qty 8.8

## 2021-11-11 MED ORDER — DIPHENHYDRAMINE HCL 25 MG PO CAPS: 25.0000 mg | ORAL_CAPSULE | Freq: Four times a day (QID) | ORAL | Status: DC | PRN

## 2021-11-11 MED ORDER — FENTANYL CITRATE PF 50 MCG/ML IJ SOSY
PREFILLED_SYRINGE | INTRAMUSCULAR | Status: AC
  Administered 2021-11-11: 50 ug
  Filled 2021-11-11: qty 1

## 2021-11-11 MED ORDER — LIDOCAINE 2% (20 MG/ML) 5 ML SYRINGE
INTRAMUSCULAR | Status: DC | PRN
  Administered 2021-11-11: 100 mg via INTRAVENOUS

## 2021-11-11 MED ORDER — CHLORHEXIDINE GLUCONATE 0.12 % MT SOLN
15.0000 mL | OROMUCOSAL | Status: AC
  Administered 2021-11-11: 15 mL via OROMUCOSAL

## 2021-11-11 MED ORDER — OXYCODONE HCL 5 MG PO TABS
5.0000 mg | ORAL_TABLET | ORAL | Status: DC | PRN
  Administered 2021-11-12: 10 mg via ORAL
  Filled 2021-11-11: qty 2

## 2021-11-11 MED ORDER — PIPERACILLIN-TAZOBACTAM 3.375 G IVPB: 3.3750 g | Freq: Three times a day (TID) | INTRAVENOUS | Status: DC

## 2021-11-11 MED ORDER — PIPERACILLIN-TAZOBACTAM 3.375 G IVPB
3.3750 g | Freq: Three times a day (TID) | INTRAVENOUS | Status: DC
  Administered 2021-11-11 – 2021-11-14 (×8): 3.375 g via INTRAVENOUS
  Filled 2021-11-11 (×8): qty 50

## 2021-11-11 MED ORDER — DEXAMETHASONE SODIUM PHOSPHATE 10 MG/ML IJ SOLN
INTRAMUSCULAR | Status: AC
  Filled 2021-11-11: qty 1

## 2021-11-11 MED ORDER — MORPHINE SULFATE (PF) 2 MG/ML IV SOLN
2.0000 mg | INTRAVENOUS | Status: DC | PRN
  Administered 2021-11-11: 4 mg via INTRAVENOUS
  Filled 2021-11-11: qty 2

## 2021-11-11 MED ORDER — NICOTINE 21 MG/24HR TD PT24: 21.0000 mg | MEDICATED_PATCH | Freq: Every day | TRANSDERMAL | Status: DC

## 2021-11-11 SURGICAL SUPPLY — 30 items
BAG COUNTER SPONGE SURGICOUNT (BAG) ×1 IMPLANT
BENZOIN TINCTURE PRP APPL 2/3 (GAUZE/BANDAGES/DRESSINGS) IMPLANT
BINDER BREAST LRG (GAUZE/BANDAGES/DRESSINGS) IMPLANT
BINDER BREAST XLRG (GAUZE/BANDAGES/DRESSINGS) IMPLANT
BLADE SURG 15 STRL LF DISP TIS (BLADE) ×1 IMPLANT
BLADE SURG 15 STRL SS (BLADE) ×1
BNDG GAUZE ELAST 4 BULKY (GAUZE/BANDAGES/DRESSINGS) ×1 IMPLANT
DRAIN PENROSE 0.5X18 (DRAIN) ×1 IMPLANT
DRAPE LAPAROTOMY T 102X78X121 (DRAPES) IMPLANT
DRSG PAD ABDOMINAL 8X10 ST (GAUZE/BANDAGES/DRESSINGS) ×1 IMPLANT
ELECT REM PT RETURN 15FT ADLT (MISCELLANEOUS) ×2 IMPLANT
GAUZE PACKING IODOFORM 1/4X15 (PACKING) IMPLANT
GAUZE SPONGE 4X4 12PLY STRL (GAUZE/BANDAGES/DRESSINGS) ×2 IMPLANT
GLOVE SRG 8 PF TXTR STRL LF DI (GLOVE) ×1 IMPLANT
GLOVE SURG ENC MOIS LTX SZ7.5 (GLOVE) ×2 IMPLANT
GLOVE SURG UNDER POLY LF SZ8 (GLOVE) ×1
GOWN STRL REUS W/ TWL XL LVL3 (GOWN DISPOSABLE) ×2 IMPLANT
GOWN STRL REUS W/TWL XL LVL3 (GOWN DISPOSABLE) ×1
KIT BASIN OR (CUSTOM PROCEDURE TRAY) ×2 IMPLANT
KIT TURNOVER KIT A (KITS) ×1 IMPLANT
MARKER SKIN DUAL TIP RULER LAB (MISCELLANEOUS) ×1 IMPLANT
NEEDLE HYPO 22GX1.5 SAFETY (NEEDLE) ×2 IMPLANT
PACK GENERAL/GYN (CUSTOM PROCEDURE TRAY) ×2 IMPLANT
PAD TELFA 2X3 NADH STRL (GAUZE/BANDAGES/DRESSINGS) IMPLANT
SPIKE FLUID TRANSFER (MISCELLANEOUS) IMPLANT
SPONGE T-LAP 4X18 ~~LOC~~+RFID (SPONGE) ×2 IMPLANT
STAPLER VISISTAT 35W (STAPLE) IMPLANT
SUT ETHILON 2 0 PS N (SUTURE) ×1 IMPLANT
SUT VIC AB 4-0 SH 18 (SUTURE) IMPLANT
SYR 20ML LL LF (SYRINGE) ×1 IMPLANT

## 2021-11-11 NOTE — Consult Note (Signed)
[facility] ?Medical Consultation ? ?Dean [name] ZYS:[id number] DOB: Jun 23, 1971 DOA: 11/11/2021 ?PCP: Veverly Fells, MD  ? ?Requesting physician: Marin Olp, MD ?Date of consultation: 11/11/2021 ?Reason for consultation: Management of chronic conditions ? ?Impression/Recommendations ?Principal Problem: ?  Perianal abscess ? ? ? ?CAD s/p DES to distal RCA, proximal LAD in 2011 ?-Patient denies any angina symptoms and previously had preoperative clearance by cardiology in 2022 for a separate surgery and was recommended to have Plavix be held.  Continue to hold Plavix due to the extensiveness necrotizing fasciitis and potential need to return to the OR tomorrow. ?Resume Plavix once cleared to do so by primary general surgery team ?-continue Imdur, statin ? ?2.  History of CVA ?Hold Plavix as stated above ? ?3.  Type 2 diabetes not insulin-dependent ?- Hold home oral antidiabetic.  Continue q4hr sliding scale for tighter control post-operatively ?-obtain updated HbA1C ? ?4.  Paroxysmal atrial fibrillation ?-CHA2D2VASc of 4.Pt previously on Eliquis but appears to have discontinued it on his own following hip surgery in 2022. Will need to discuss with pt regarding resumption once safe to do so from a surgical standpoint ?- Continue amiodarone, bisoprolol, diltiazem ? ?5.  COPD ?Stable.  Continue home bronchodilator ? ?6.  Necrotizing fasciitis of the left buttocks ?New IV antibiotics, pain management and wound care per primary general surgery team ? ? ?I will followup again tomorrow. Please contact me if I can be of assistance in the meanwhile. Thank you for this consultation. ? ?Chief Complaint: Perirectal infection ? ?HPI:  ?Dean [name] is a 51 year old male with history of CAD s/p DES to distal RCA, proximal LAD on Plavix, WPW, SVT, atrial fibrillation, CVA, AVN of hips, COPD, type 2 diabetes and GERD who presented with a left gluteal abscess and was admitted by general surgery. ?Intraoperatively he was  found to have a large necrotic abscess with Fournier's gangrene in the left buttocks.  He had approximately 10 x 10 x 10 cm area of debrided tissue.  Hospitalist was called for management of his other chronic comorbidities. ? ?Postoperatively, patient denies any symptoms other than pain to his wound.  Denies any chest pain or shortness of breath.  No nausea, vomiting or abdominal pain. ? ?review of Systems:  ?Positives and negatives as above ? ?Past Medical History:  ?Diagnosis Date  ? Anxiety   ? Asthma   ? Atrial arrhythmia   ? multiples with 5-6 ablations  ? Atrial fibrillation with rapid ventricular response (HCC)   ? With rates up to 200 as well as short PR tachycardia, cycle length of 240 ms.  ? Chest tightness 05/08/2014  ? Diabetes mellitus, type II (HCC)   ? Dysrhythmia   ? GERD (gastroesophageal reflux disease)   ? Morbid obesity (HCC)   ? NSTEMI (non-ST elevated myocardial infarction) (HCC) 2021  ? Palpitations 05/08/2014  ? Pancreatitis   ? Pneumonia ~ 2010  ? Snores   ? Wil occasional apnea. Had a remote sleep study but does not remember results.  ? SOB (shortness of breath) 05/08/2014  ? SVT (supraventricular tachycardia) (HCC)   ? WPW (Wolff-Parkinson-White syndrome)   ? ?Past Surgical History:  ?Procedure Laterality Date  ? ATRIAL FIBRILLATION ABLATION    ? "& WPW"  ? CARDIAC CATHETERIZATION    ? "6 or 7" (05/18/2016)  ? CORONARY STENT INTERVENTION N/A 11/15/2019  ? Procedure: CORONARY STENT INTERVENTION;  Surgeon: Kathleene Hazel, MD;  Location: [facility];  Service: Cardiovascular;  Laterality: N/A;  ?  FRACTURE SURGERY    ? LEFT HEART CATH AND CORONARY ANGIOGRAPHY N/A 11/15/2019  ? Procedure: LEFT HEART CATH AND CORONARY ANGIOGRAPHY;  Surgeon: Kathleene Hazel, MD;  Location: [facility];  Service: Cardiovascular;  Laterality: N/A;  ? TOTAL HIP ARTHROPLASTY Left 02/19/2021  ? Procedure: LEFT TOTAL HIP ARTHROPLASTY ANTERIOR APPROACH;  Surgeon: Tarry Kos, MD;  Location:  [facility] OR;  Service: Orthopedics;  Laterality: Left;  3-C  ? WRIST FRACTURE SURGERY Right 1990s  ? ?Social History:  reports that he has been smoking cigarettes. He has a 30.00 pack-year smoking history. He has never used smokeless tobacco. He reports that he does not drink alcohol and does not use drugs. ? ?Allergies  ?Allergen Reactions  ? Prednisone Other (See Comments)  ?  "goes crazy"  ? ?Family History  ?Problem Relation Age of Onset  ? Clotting disorder Father   ? Diabetes Father   ? Heart attack Father 55  ? Hypertension Father   ? ? ?Prior to Admission medications   ?Medication Sig Start Date End Date Taking? Authorizing Provider  ?alprazolam (XANAX) 2 MG tablet Take 2 mg by mouth 3 (three) times daily.   Yes [provider]  ?amiodarone (PACERONE) 200 MG tablet TAKE 1 TABLET ONCE DAILY. ?Patient taking differently: Take 200 mg by mouth daily. 12/14/16  Yes Marinus Maw, MD  ?atorvastatin (LIPITOR) 80 MG tablet Take 1 tablet (80 mg total) by mouth daily at 6 PM. ?Patient taking differently: Take 80 mg by mouth daily at 6 PM. 11/17/19  Yes Laverda Page B, NP  ?bisoprolol (ZEBETA) 5 MG tablet TAKE 1 TABLET ONCE DAILY. ?Patient taking differently: Take 5 mg by mouth daily. 05/22/20  Yes Marinus Maw, MD  ?budesonide-formoterol [facility]) 160-4.5 MCG/ACT inhaler Inhale 2 puffs into the lungs 2 (two) times daily as needed (shortness of breath).   Yes [provider]  ?cephALEXin (KEFLEX) 500 MG capsule Take 500 mg by mouth 4 (four) times daily.   Yes [provider]  ?cetirizine (ZYRTEC) 10 MG tablet Take 10 mg by mouth daily.   Yes [provider]  ?clopidogrel (PLAVIX) 75 MG tablet Take 75 mg by mouth daily.   Yes [provider]  ?diltiazem (CARDIZEM CD) 240 MG 24 hr capsule Take 1 capsule (240 mg total) by mouth daily. 05/22/20  Yes Marinus Maw, MD  ?docusate sodium (COLACE) 100 MG capsule Take 1 capsule (100 mg total) by mouth daily as needed. 02/19/21 02/19/22  Yes Cristie Hem, PA-C  ?isosorbide mononitrate (IMDUR) 60 MG 24 hr tablet Take 1 tablet (60 mg total) by mouth daily. 05/22/20  Yes Marinus Maw, MD  ?levalbuterol [facility] HFA) 45 MCG/ACT inhaler Inhale 2 puffs into the lungs every 6 (six) hours as needed for wheezing or shortness of breath.   Yes [provider]  ?levalbuterol (XOPENEX) 0.63 MG/3ML nebulizer solution Take 0.63 mg by nebulization every 6 (six) hours as needed for shortness of breath or wheezing. 10/29/21  Yes [provider]  ?metFORMIN (GLUCOPHAGE) 1000 MG tablet Take 1,000 mg by mouth daily. 08/17/15  Yes [provider]  ?montelukast (SINGULAIR) 10 MG tablet Take 10 mg by mouth every morning.    Yes [provider]  ?nitroGLYCERIN (NITROSTAT) 0.4 MG SL tablet Place 1 tablet (0.4 mg total) under the tongue every 5 (five) minutes x 3 doses as needed for chest pain. 11/17/19  Yes Arty Baumgartner, NP  ?oxyCODONE-acetaminophen (PERCOCET) 10-325 MG tablet Take  1 tablet by mouth 2 (two) times daily as needed for pain. 03/29/21  Yes Tarry KosXu, Naiping M, MD  ?pantoprazole (PROTONIX) 40 MG tablet Take 40 mg by mouth daily. 09/26/19  Yes [provider]  ?sucralfate (CARAFATE) 1 g tablet Take 1 tablet (1 g total) by mouth 4 (four) times daily -  with meals and at bedtime. ?Patient taking differently: Take 1 g by mouth 2 (two) times daily. 07/09/17  Yes Rolan BuccoBelfi, Melanie, MD  ?sulfamethoxazole-trimethoprim (BACTRIM DS) 800-160 MG tablet Take 1 tablet by mouth 2 (two) times daily.   Yes [provider]  ?TIADYLT ER 240 MG 24 hr capsule Take 240 mg by mouth daily. 10/21/21  Yes [provider]  ?aspirin EC 81 MG tablet Take 1 tablet (81 mg total) by mouth 2 (two) times daily. ?Patient not taking: Reported on 11/11/2021 02/19/21   Cristie HemStanbery, Mary L, PA-C  ?HYDROcodone-acetaminophen (NORCO) 5-325 MG tablet Take 1-2 tablets by mouth 2 (two) times daily as needed for moderate pain. 04/11/21   Cristie HemStanbery, Mary L,  PA-C  ?HYDROmorphone (DILAUDID) 2 MG tablet Take 1 tablet (2 mg total) by mouth every 4 (four) hours as needed for severe pain. 02/27/21   Tarry KosXu, Naiping M, MD  ?ketorolac (TORADOL) 10 MG tablet Take 1 tablet

## 2021-11-11 NOTE — H&P (Signed)
CC: Gluteal/perirectal abscess  HPI: Dean [name] is an 51 y.o. male with hx of CAD (s/p cardiac clearance 01/2021 for hip surgery now just on plavix), HTN, DM, HLD, afib (s/p now ablation), GERD - presented to [facility] with complaints of perirectal abscess. Painful nodule in left buttock beginning 1 wk ago, reports had I&D in [place] 4 days ago and a 'drain placed.' This was removed at pcp yesterday. Notes swelling has progressed over the last 5-6 days, never felt like anything got better after his attempted drainage procedure. +Fevers to 103F at home despite bactrim. Does report associated difficulty urinating that is new over the last day or so. When he does, he denies any dysuria. No other complaints.  Received IV Zosyn at [facility]  Past Medical History:  Diagnosis Date   Anxiety    Asthma    Atrial arrhythmia    multiples with 5-6 ablations   Atrial fibrillation with rapid ventricular response (HCC)    With rates up to 200 as well as short PR tachycardia, cycle length of 240 ms.   Chest tightness 05/08/2014   Diabetes mellitus, type II (HCC)    Dysrhythmia    GERD (gastroesophageal reflux disease)    Morbid obesity (HCC)    NSTEMI (non-ST elevated myocardial infarction) (HCC) 2021   Palpitations 05/08/2014   Pancreatitis    Pneumonia ~ 2010   Snores    Wil occasional apnea. Had a remote sleep study but does not remember results.   SOB (shortness of breath) 05/08/2014   SVT (supraventricular tachycardia) (HCC)    WPW (Wolff-Parkinson-Rachana Malesky syndrome)     Past Surgical History:  Procedure Laterality Date   ATRIAL FIBRILLATION ABLATION     "& WPW"   CARDIAC CATHETERIZATION     "6 or 7" (05/18/2016)   CORONARY STENT INTERVENTION N/A 11/15/2019   Procedure: CORONARY STENT INTERVENTION;  Surgeon: Kathleene Hazel, MD;  Location: [facility] INVASIVE CV LAB;  Service: Cardiovascular;  Laterality: N/A;   FRACTURE SURGERY     LEFT HEART CATH AND CORONARY ANGIOGRAPHY N/A  11/15/2019   Procedure: LEFT HEART CATH AND CORONARY ANGIOGRAPHY;  Surgeon: Kathleene Hazel, MD;  Location: [facility] INVASIVE CV LAB;  Service: Cardiovascular;  Laterality: N/A;   TOTAL HIP ARTHROPLASTY Left 02/19/2021   Procedure: LEFT TOTAL HIP ARTHROPLASTY ANTERIOR APPROACH;  Surgeon: Tarry Kos, MD;  Location: [facility] OR;  Service: Orthopedics;  Laterality: Left;  3-C   WRIST FRACTURE SURGERY Right 1990s    Family History  Problem Relation Age of Onset   Clotting disorder Father    Diabetes Father    Heart attack Father 91   Hypertension Father     Social:  reports that he has been smoking cigarettes. He has a 30.00 pack-year smoking history. He has never used smokeless tobacco. He reports that he does not drink alcohol and does not use drugs.  Allergies:  Allergies  Allergen Reactions   Prednisone Other (See Comments)    "goes crazy"    Medications: I have reviewed the patient's current medications.  Results for orders placed or performed during the hospital encounter of 11/11/21 (from the past 48 hour(s))  Basic metabolic panel     Status: Abnormal   Collection Time: 11/11/21 12:33 PM  Result Value Ref Range   Sodium 133 (L) 135 - 145 mmol/L   Potassium 4.6 3.5 - 5.1 mmol/L   Chloride 94 (L) 98 - 111 mmol/L   CO2 31 22 - 32 mmol/L  Glucose, Bld 143 (H) 70 - 99 mg/dL    Comment: Glucose reference range applies only to samples taken after fasting for at least 8 hours.   BUN 15 6 - 20 mg/dL   Creatinine, Ser 1.61 0.61 - 1.24 mg/dL   Calcium 8.6 (L) 8.9 - 10.3 mg/dL   GFR, Estimated >09 >60 mL/min    Comment: (NOTE) Calculated using the CKD-EPI Creatinine Equation (2021)    Anion gap 8 5 - 15    Comment: Performed at [facility], [address] [place], [place] [address]  CBC with Differential     Status: Abnormal   Collection Time: 11/11/21 12:33 PM  Result Value Ref Range   WBC 24.5 (H) 4.0 - 10.5 K/uL   RBC 4.41 4.22 - 5.81 MIL/uL   Hemoglobin 13.7  13.0 - 17.0 g/dL   HCT 81.1 91.4 - 78.2 %   MCV 90.7 80.0 - 100.0 fL   MCH 31.1 26.0 - 34.0 pg   MCHC 34.3 30.0 - 36.0 g/dL   RDW 95.6 21.3 - 08.6 %   Platelets 389 150 - 400 K/uL   nRBC 0.0 0.0 - 0.2 %   Neutrophils Relative % 85 %   Neutro Abs 21.0 (H) 1.7 - 7.7 K/uL   Lymphocytes Relative 7 %   Lymphs Abs 1.6 0.7 - 4.0 K/uL   Monocytes Relative 6 %   Monocytes Absolute 1.5 (H) 0.1 - 1.0 K/uL   Eosinophils Relative 1 %   Eosinophils Absolute 0.1 0.0 - 0.5 K/uL   Basophils Relative 0 %   Basophils Absolute 0.1 0.0 - 0.1 K/uL   Immature Granulocytes 1 %   Abs Immature Granulocytes 0.19 (H) 0.00 - 0.07 K/uL    Comment: Performed at [facility], [address] [place], [place] [address]  Lactic acid, plasma     Status: None   Collection Time: 11/11/21 12:33 PM  Result Value Ref Range   Lactic Acid, Venous 1.5 0.5 - 1.9 mmol/L    Comment: Performed at [facility], [address] [place], [place] [address]    CT PELVIS W CONTRAST  Result Date: 11/11/2021 CLINICAL DATA:  Fever, perianal abscess EXAM: CT PELVIS WITH CONTRAST TECHNIQUE: Multidetector CT imaging of the pelvis was performed using the standard protocol following the bolus administration of intravenous contrast. RADIATION DOSE REDUCTION: This exam was performed according to the departmental dose-optimization program which includes automated exposure control, adjustment of the mA and/or kV according to patient size and/or use of iterative reconstruction technique. CONTRAST:  OMNIPAQUE IOHEXOL 300 MG/ML  SOLN COMPARISON:  CT done on 12/31/2020 FINDINGS: There is no free fluid in the pelvis. There is mild diffuse wall thickening in the sigmoid colon. Appendix is not dilated. There is marked inflammatory stranding in the left perianal region. There is 5.2 x 3.2 cm area of focal homogeneous density in the left perianal region. There is another area of low attenuation measuring approximally 4.4 x 2.1 cm  in the medial left gluteal region. There are no pockets of air in the left perianal region. Evaluation of the urinary bladder is limited by beam hardening artifacts from orthopedic hardware. Degenerative changes are noted in the lower lumbar spine with facet hypertrophy. Bulging of annulus is noted at L5-S1 level causing extrinsic pressure over the ventral margin of thecal sac. There is encroachment of neural foramina at L4-L5 and L5-S1 levels. There is previous left hip arthroplasty. No recent  fracture is seen. There is no dislocation in the hips. SI joints are unremarkable. There are slightly enlarged lymph nodes in the both inguinal regions, more so on the left side. Largest of the nodes measures approximately 16 x 14 mm. Degenerative changes are noted in the right hip. IMPRESSION: There is marked inflammatory stranding in the fat planes in the left perianal region suggesting cellulitis. There is 5.2 x 3.2 cm area of homogeneous opacity in the left perianal region immediately posterior to the anus. There is another smaller collection measuring 4.4 x 2.1 cm. Findings suggest possible left perianal cellulitis and abscesses. There is no demonstrable perirectal fluid collection. There is diffuse wall thickening in the sigmoid colon which may be due to incomplete distention or suggest nonspecific colitis. Previous left hip arthroplasty. Degenerative changes are noted in the right hip. Electronically Signed   By: Ernie Avena M.D.   On: 11/11/2021 13:53    ROS - all of the below systems have been reviewed with the patient and positives are indicated with bold text General: chills, fever or night sweats Eyes: blurry vision or double vision ENT: epistaxis or sore throat Allergy/Immunology: itchy/watery eyes or nasal congestion Hematologic/Lymphatic: bleeding problems, blood clots or swollen lymph nodes Endocrine: temperature intolerance or unexpected weight changes Breast: new or changing breast lumps or  nipple discharge Resp: cough, shortness of breath, or wheezing CV: chest pain or dyspnea on exertion GI: as per HPI GU: dysuria, trouble voiding, or hematuria MSK: joint pain or joint stiffness Neuro: TIA or stroke symptoms Derm: pruritus and skin lesion changes Psych: anxiety and depression  PE Blood pressure (!) 115/58, pulse 71, temperature 98.3 F (36.8 C), temperature source Oral, resp. rate 16, height 6\' 6"  (1.981 m), weight 109.3 kg, SpO2 94 %. Constitutional: NAD; conversant; no deformities; wearing mask Eyes: Moist conjunctiva; no lid lag; anicteric; PERRL Neck: Trachea midline; no thyromegaly Lungs: Normal respiratory effort; no tactile fremitus CV: RRR; no palpable thrills; 1+ pitting edema GI: Abd soft, NT/ND; no palpable hepatosplenomegaly Anorectal: Significant induration and erythema to left buttock with some associated skin necrosis overlying presumed abscess/fluctuance. No active drainage MSK: Normal range of motion of extremities; no clubbing/cyanosis Psychiatric: Appropriate affect; alert and oriented x3 Lymphatic: No palpable cervical or axillary lymphadenopathy  Results for orders placed or performed during the hospital encounter of 11/11/21 (from the past 48 hour(s))  Basic metabolic panel     Status: Abnormal   Collection Time: 11/11/21 12:33 PM  Result Value Ref Range   Sodium 133 (L) 135 - 145 mmol/L   Potassium 4.6 3.5 - 5.1 mmol/L   Chloride 94 (L) 98 - 111 mmol/L   CO2 31 22 - 32 mmol/L   Glucose, Bld 143 (H) 70 - 99 mg/dL    Comment: Glucose reference range applies only to samples taken after fasting for at least 8 hours.   BUN 15 6 - 20 mg/dL   Creatinine, Ser 1.61 0.61 - 1.24 mg/dL   Calcium 8.6 (L) 8.9 - 10.3 mg/dL   GFR, Estimated >09 >60 mL/min    Comment: (NOTE) Calculated using the CKD-EPI Creatinine Equation (2021)    Anion gap 8 5 - 15    Comment: Performed at [facility], [address] [place], [place] [address]  CBC with  Differential     Status: Abnormal   Collection Time: 11/11/21 12:33 PM  Result Value Ref Range   WBC 24.5 (H) 4.0 - 10.5 K/uL   RBC 4.41 4.22 -  5.81 MIL/uL   Hemoglobin 13.7 13.0 - 17.0 g/dL   HCT 09.8 11.9 - 14.7 %   MCV 90.7 80.0 - 100.0 fL   MCH 31.1 26.0 - 34.0 pg   MCHC 34.3 30.0 - 36.0 g/dL   RDW 82.9 56.2 - 13.0 %   Platelets 389 150 - 400 K/uL   nRBC 0.0 0.0 - 0.2 %   Neutrophils Relative % 85 %   Neutro Abs 21.0 (H) 1.7 - 7.7 K/uL   Lymphocytes Relative 7 %   Lymphs Abs 1.6 0.7 - 4.0 K/uL   Monocytes Relative 6 %   Monocytes Absolute 1.5 (H) 0.1 - 1.0 K/uL   Eosinophils Relative 1 %   Eosinophils Absolute 0.1 0.0 - 0.5 K/uL   Basophils Relative 0 %   Basophils Absolute 0.1 0.0 - 0.1 K/uL   Immature Granulocytes 1 %   Abs Immature Granulocytes 0.19 (H) 0.00 - 0.07 K/uL    Comment: Performed at [facility], [address] [place], [place] [address]  Lactic acid, plasma     Status: None   Collection Time: 11/11/21 12:33 PM  Result Value Ref Range   Lactic Acid, Venous 1.5 0.5 - 1.9 mmol/L    Comment: Performed at [facility], [address] [place], [place] [address]    CT PELVIS W CONTRAST  Result Date: 11/11/2021 CLINICAL DATA:  Fever, perianal abscess EXAM: CT PELVIS WITH CONTRAST TECHNIQUE: Multidetector CT imaging of the pelvis was performed using the standard protocol following the bolus administration of intravenous contrast. RADIATION DOSE REDUCTION: This exam was performed according to the departmental dose-optimization program which includes automated exposure control, adjustment of the mA and/or kV according to patient size and/or use of iterative reconstruction technique. CONTRAST:  OMNIPAQUE IOHEXOL 300 MG/ML  SOLN COMPARISON:  CT done on 12/31/2020 FINDINGS: There is no free fluid in the pelvis. There is mild diffuse wall thickening in the sigmoid colon. Appendix is not dilated. There is marked inflammatory stranding in the left  perianal region. There is 5.2 x 3.2 cm area of focal homogeneous density in the left perianal region. There is another area of low attenuation measuring approximally 4.4 x 2.1 cm in the medial left gluteal region. There are no pockets of air in the left perianal region. Evaluation of the urinary bladder is limited by beam hardening artifacts from orthopedic hardware. Degenerative changes are noted in the lower lumbar spine with facet hypertrophy. Bulging of annulus is noted at L5-S1 level causing extrinsic pressure over the ventral margin of thecal sac. There is encroachment of neural foramina at L4-L5 and L5-S1 levels. There is previous left hip arthroplasty. No recent fracture is seen. There is no dislocation in the hips. SI joints are unremarkable. There are slightly enlarged lymph nodes in the both inguinal regions, more so on the left side. Largest of the nodes measures approximately 16 x 14 mm. Degenerative changes are noted in the right hip. IMPRESSION: There is marked inflammatory stranding in the fat planes in the left perianal region suggesting cellulitis. There is 5.2 x 3.2 cm area of homogeneous opacity in the left perianal region immediately posterior to the anus. There is another smaller collection measuring 4.4 x 2.1 cm. Findings suggest possible left perianal cellulitis and abscesses. There is no demonstrable perirectal fluid collection. There is diffuse wall thickening in the sigmoid colon which may be due to incomplete distention or suggest nonspecific colitis. Previous left hip arthroplasty. Degenerative changes  are noted in the right hip. Electronically Signed   By: Ernie Avena M.D.   On: 11/11/2021 13:53    I have personally reviewed the relevant CT Pelvis dated 11/11/21, CBC, CMP  A/P: Dean [name] is an 51 y.o. male with CAD (s/p cardiac clearance 01/2021 for hip surgery now just on plavix), HTN, DM, HLD, afib (s/p now ablation), GERD now with persistent perirectal abscess -  progressively gotten worse by his account over last 5 days despite antibiotics  CT shows abscess in left perirectal region with associated induration and cellulitis; abscess 5 x 3 cm; separate abscess 4 x 2 cm  -The anatomy and physiology of the anal canal was discussed with him. The pathophysiology of anal abscess/fistula was discussed as well  -Given persistence despite attempts at bedside I&D and now fevers, discussed proceeding to OR for incision and drainage of left gluteal abscess, possible seton placement based on intraoperative findings, anorectal exam under anesthesia.  -Discussed may require large wound to adequately control, including possible drain placements (I.e. penrose)  -The planned procedure, material risks (including, but not limited to, pain, bleeding, infection, scarring, need for blood transfusion, damage to anal sphincter, incontinence of gas and/or stool, need for additional procedures, recurrence, pneumonia, heart attack, stroke, death) benefits and alternatives to surgery were discussed at length. I noted a good probability that the procedure would help improve his symptoms. The patient's questions were answered to his satisfaction, he voiced understanding and elected to proceed with surgery. Additionally, we discussed typical postoperative expectations and the recovery process.  Marin Olp, MD FACS [facility]

## 2021-11-11 NOTE — ED Notes (Signed)
Patient transported to CT 

## 2021-11-11 NOTE — Transfer of Care (Signed)
Immediate Anesthesia Transfer of Care Note ? ?Patient: Dean [name] ? ?Procedure(s) Performed: GLUTEAL INCISION AND DRAINAGE ABSCESS (Left) ? ?Patient Location: PACU ? ?Anesthesia Type:General ? ?Level of Consciousness: awake, alert , oriented and patient cooperative ? ?Airway & Oxygen Therapy: Patient Spontanous Breathing and Patient connected to face mask oxygen ? ?Post-op Assessment: Report given to RN, Post -op Vital signs reviewed and stable and Patient moving all extremities X 4 ? ?Post vital signs: stable ? ?Last Vitals:  ?Vitals Value Taken Time  ?BP 137/69 11/11/21 1733  ?Temp    ?Pulse 64 11/11/21 1742  ?Resp 17 11/11/21 1742  ?SpO2 96 % 11/11/21 1742  ?Vitals shown include unvalidated device data. ? ?Last Pain:  ?Vitals:  ? 11/11/21 1620  ?TempSrc: Oral  ?PainSc:   ?   ? ?  ? ?Complications: No notable events documented. ?

## 2021-11-11 NOTE — Anesthesia Procedure Notes (Signed)
Procedure Name: Intubation ?Date/Time: 11/11/2021 4:36 PM ?Performed by: Lissa Morales, CRNA ?Pre-anesthesia Checklist: Patient identified, Emergency Drugs available, Suction available and Patient being monitored ?Patient Re-evaluated:Patient Re-evaluated prior to induction ?Oxygen Delivery Method: Circle system utilized ?Preoxygenation: Pre-oxygenation with 100% oxygen ?Induction Type: IV induction, Rapid sequence and Cricoid Pressure applied ?Laryngoscope Size: Mac and 4 ?Grade View: Grade II ?Tube type: Oral ?Tube size: 8.0 mm ?Number of attempts: 1 ?Airway Equipment and Method: Stylet and Oral airway ?Placement Confirmation: ETT inserted through vocal cords under direct vision, positive ETCO2 and breath sounds checked- equal and bilateral ?Secured at: 22 cm ?Tube secured with: Tape ?Dental Injury: Teeth and Oropharynx as per pre-operative assessment  ? ? ? ? ?

## 2021-11-11 NOTE — Anesthesia Postprocedure Evaluation (Signed)
Anesthesia Post Note ? ?Patient: Dean [name] ? ?Procedure(s) Performed: GLUTEAL INCISION AND DRAINAGE ABSCESS (Left) ? ?  ? ?Patient location during evaluation: PACU ?Anesthesia Type: General ?Level of consciousness: awake and alert ?Pain management: pain level controlled ?Vital Signs Assessment: post-procedure vital signs reviewed and stable ?Respiratory status: spontaneous breathing, nonlabored ventilation, respiratory function stable and patient connected to nasal cannula oxygen ?Cardiovascular status: blood pressure returned to baseline and stable ?Postop Assessment: no apparent nausea or vomiting ?Anesthetic complications: no ? ? ?No notable events documented. ? ?Last Vitals:  ?Vitals:  ? 11/11/21 1804 11/11/21 1813  ?BP:    ?Pulse: 65 66  ?Resp: 16 15  ?Temp:    ?SpO2: 98% 96%  ?  ?Last Pain:  ?Vitals:  ? 11/11/21 1813  ?TempSrc:   ?PainSc: 4   ? ? ?  ?  ?  ?  ?  ?  ? ?Amyah Clawson S ? ? ? ? ?

## 2021-11-11 NOTE — Progress Notes (Addendum)
Pharmacy Antibiotic Note ? ?WAINO Dean [name] is a 51 y.o. male admitted on 11/11/2021 with  wound infection .  Pharmacy has been consulted for vancomycin and Zosyn dosing. ? ?Plan: ?Zosyn 3.375g IV q8h (4 hour infusion). ?Vancomycin 2000 mg IV 1 then 1500 mg IV q12h ( Scr of 1.129, AUC 449, wt 109.3 kg )  ?Adjust to Clindamycin 900 mg IV q8h per MD  ?Daily SCr ordered  ?Monitor clinical course, renal function, cultures as available ? ? ?Height: 6\' 6"  (198.1 cm) ?Weight: 109.3 kg (241 lb) ?IBW/kg (Calculated) : 91.4 ? ?Temp (24hrs), Avg:99.1 ?F (37.3 ?C), Min:98.3 ?F (36.8 ?C), Max:100.1 ?F (37.8 ?C) ? ?Recent Labs  ?Lab 11/11/21 ?1233  ?WBC 24.5*  ?CREATININE 1.19  ?LATICACIDVEN 1.5  ?  ?Estimated Creatinine Clearance: 97.1 mL/min (by C-G formula based on SCr of 1.19 mg/dL).   ? ?Allergies  ?Allergen Reactions  ? Prednisone Other (See Comments)  ?  "goes crazy"  ? ? ?Antimicrobials this admission: ?3/28 Zosyn >>  ?3/28 vancomycin >>  ?3/28 Clindamycin >>  ? ?Dose adjustments this admission: ? ? ?Microbiology results: ?3/28 BCx:  ?3/28 abscess Cx  ?  ? ?Thank you for allowing pharmacy to be a part of this patientDeans care. ?4/28, PharmD, BCPS ?11/11/2021 7:45 PM ? ? ?

## 2021-11-11 NOTE — Anesthesia Preprocedure Evaluation (Addendum)
Anesthesia Evaluation  ?Patient identified by MRN, date of birth, ID band ?Patient awake ? ? ? ?Reviewed: ?Allergy & Precautions, NPO status , Patient's Chart, lab work & pertinent test results, reviewed documented beta blocker date and time  ? ?Airway ?Mallampati: II ? ?TM Distance: >3 FB ?Neck ROM: Full ? ? ? Dental ?no notable dental hx. ?(+) Dental Advisory Given ?  ?Pulmonary ?asthma , pneumonia, resolved, COPD,  COPD inhaler, Current Smoker and Patient abstained from smoking.,  ?  ?Pulmonary exam normal ?breath sounds clear to auscultation ? ? ? ? ? ? Cardiovascular ?hypertension, Pt. on medications and Pt. on home beta blockers ?+ CAD, + Past MI and + Cardiac Stents  ?Normal cardiovascular exam+ dysrhythmias Atrial Fibrillation  ?Rhythm:Regular Rate:Normal ? ?Echo 11/15/19 ?1. Left ventricular ejection fraction, by estimation, is 45 to 50%. The left ventricle has mildly decreased function. The left ventricle demonstrates regional wall motion abnormalities (see scoring diagram/findings for description). Left ventricular diastolic parameters are indeterminate. There is mild hypokinesis of the  ?left ventricular, basal-mid anteroseptal wall and anterior segment. There is mild hypokinesis of the left ventricular, mid-apical inferoseptal wall.  ??2. Right ventricular systolic function is low normal. The right ventricular size is normal.  ??3. The mitral valve is normal in structure. Trivial mitral valve regurgitation. No evidence of mitral stenosis.  ??4. The aortic valve was not well visualized. Aortic valve regurgitation is not visualized. No aortic stenosis is present.  ??5. The inferior vena cava is normal in size with <50% respiratory variability, suggesting right atrial pressure of 8 mmHg.  ? ?EKG 02/10/21 ?NSR ? ?Hx/o WPW ?  ?Neuro/Psych ?Anxiety CVA, Residual Symptoms   ? GI/Hepatic ?Neg liver ROS, GERD  Medicated and Controlled,  ?Endo/Other  ?diabetes, Poorly Controlled,  Type 2, Oral Hypoglycemic AgentsHyperlipidemia ? Renal/GU ?negative Renal ROS  ?negative genitourinary ?  ?Musculoskeletal ?Left Gluteal Abscess  ? Abdominal ?  ?Peds ? Hematology ?negative hematology ROS ?(+)   ?Anesthesia Other Findings ? ? Reproductive/Obstetrics ? ?  ? ? ? ? ? ? ? ? ? ? ? ? ? ?  ?  ? ? ? ? ? ? ? ?Anesthesia Physical ?Anesthesia Plan ? ?ASA: 3 and emergent ? ?Anesthesia Plan: General  ? ?Post-op Pain Management:   ? ?Induction: Intravenous ? ?PONV Risk Score and Plan: 3 and Treatment may vary due to age or medical condition, Midazolam and Ondansetron ? ?Airway Management Planned: Oral ETT ? ?Additional Equipment:  ? ?Intra-op Plan:  ? ?Post-operative Plan: Extubation in OR ? ?Informed Consent: I have reviewed the patients History and Physical, chart, labs and discussed the procedure including the risks, benefits and alternatives for the proposed anesthesia with the patient or authorized representative who has indicated his/her understanding and acceptance.  ? ? ? ?Dental advisory given ? ?Plan Discussed with: Anesthesiologist and CRNA ? ?Anesthesia Plan Comments:   ? ? ? ? ? ?Anesthesia Quick Evaluation ? ?

## 2021-11-11 NOTE — Op Note (Signed)
11/11/2021 ? ?5:27 PM ? ?PATIENT:  Dean [name]  51 y.o. male ? ?Patient Care Team: ?Veverly Fells, MD as PCP - General (Internal Medicine) ?Marinus Maw, MD as PCP - Cardiology (Cardiology) ?Azalee Course, [place] as Physician Assistant (Cardiology) ? ?PRE-OPERATIVE DIAGNOSIS: Fournier's gangrene-left buttock ? ?POST-OPERATIVE DIAGNOSIS: Same ? ?PROCEDURE:   ?Incision and drainage of left gluteal abscess x2 ?Debridement of Fournier's gangrene of the left buttock-skin, subcutaneous fat, fascia of left gluteus maximus, totaling 10 x 10 x 10 cm of tissue ?Anorectal exam under anesthesia ? ?SURGEON:  Stephanie Coup. Aamna Mallozzi, MD ? ?ASSISTANT: OR staff ? ?ANESTHESIA:   general ? ?COUNTS:  Sponge, needle and instrument counts were reported correct x2 at the conclusion of the operation. ? ?EBL: 100 mL ? ?DRAINS: Penrose drain placed through counter incision ? ?SPECIMEN: Wound cultures-left gluteal Fournier's gangrene -Gram stain, aerobic, anaerobic ? ?COMPLICATIONS: None ? ?FINDINGS: Gangrenous skin 5 x 5 cm overlying a large necrotic abscess with associated Fournier's gangrene in the left buttock.  There is a separate abscess posterior/inferior to this.  These were connected after excising the necrotic skin and subcutaneous fat.  A counterincision was created at the base of the second abscess and a Penrose drain was placed to facilitate ongoing drainage/assist in wound care.  The entire wound occupies an area of approximately 10 x 10 x 10 cm with regards to the size of the cavity and debrided tissue.  The tissue debrided was skin, subcutaneous fat, and overlying fascia over the gluteus maximus muscle.  Viable muscle fibers beneath.  Wound was packed with a Kerlix ? ?DISPOSITION: PACU in satisfactory condition ? ?DESCRIPTION: The patient was identified in preop holding and taken to the OR where he was placed on the operating room table. SCDs were placed. General endotracheal anesthesia was induced without difficulty.  A Foley  catheter was placed by nursing under sterile conditions. He was then positioned in lithotomy using Allen stirrups.  Pressure points were then padded. He was then prepped and draped in the usual sterile fashion. A surgical timeout was performed indicating the correct patient, procedure, positioning and need for preoperative antibiotics.  ? ?There is a large necrotic eschar consistent with gangrenous skin overlying the left buttock.  This is fully excised.  We immediately entered into purulent fluid.  Cultures were obtained.  Loculations were broken up.  He has a fair amount of underlying subcutaneous fat which is clearly necrotic and nonviable.  This extends towards the left portion of the anus.  No anal sphincter muscle was divided.  All associated fat is excised sharply using electrocautery.  There is a separate abscess cavity which is palpated and able to be accessed from within this wound and lies posterior to the necrotic skin.  After entering this abscess cavity, we made an overlying incision and that associated skin is a counterincision.  All loculations were then broken up.  All necrotic fat was excised.  There is also some questionably viable fascia overlying the gluteus maximus muscle.  This was also excised.  The wound measures 10 x 10 x 10 cm in size.  Hemostasis was then achieved with electrocautery.  The wound was copiously irrigated with sterile saline until the effluent ran clear.  Hemostasis was verified.  All devitalized tissue has been excised.  There are no other palpable fluid collections.  A Penrose drain was placed and secured to itself using 2-0 nylon sutures.  All sponge, needle, and instrument counts were reported correct. The wound  was then packed with a moist Kerlix gauze, covered with 4 x 4's, and ABD.  This was secured with tape.  He was then taken out of lithotomy, awakened from anesthesia, extubated, and transferred to a stretcher for transport to PACU in satisfactory condition. ? ?I  was able to update his wife via phone at conclusion. ? ?CASE DATA: ? ?Type of patient?: LDOW CASE (Surgical Hospitalist [facility] Inpatient) ? ?Status of Case? EMERGENT Add On ? ?Infection Present At Time Of Surgery (PATOS)?  INFECTION of the left buttock ? ? ? ? ?

## 2021-11-11 NOTE — ED Notes (Signed)
Report given Darel Hong with short stay  ?

## 2021-11-11 NOTE — ED Notes (Signed)
Report given to [facility] 

## 2021-11-11 NOTE — ED Triage Notes (Signed)
Has cyst near rectum, was I&D this past Friday, has an open area that is draining and very red. Very sensitive.  ?

## 2021-11-11 NOTE — ED Provider Notes (Signed)
?[facility] EMERGENCY DEPARTMENT ?Provider Note ? ? ?CSN: [id number] ?Arrival date & time: 11/11/21  1110 ? ?  ? ?History ? ?Chief Complaint  ?Patient presents with  ? Rectal Pain  ? ? ?Dean [name] is a 51 y.o. male. ? ?Patient is a 51 year old male who presents with perirectal infection.  He says he has had prior pilonidal cyst drained about 20 years ago.  He started having a painful nodule on his left buttocks area about a week ago.  He went to an ED in [place], [place] 4 days ago and it was I&D.  He had a drain placed.  He went to follow-up with his PCP yesterday and the drain was pulled.  He was supposed to get a referral to follow-up with someone else about it.  He said its gotten worse and he wanted to get it checked out again.  Its very painful in his buttocks area.  It hurts to have a bowel movement.  He has had some fever and chills with a temperature up to 102-103.  No associated abdominal pain.  No urinary symptoms. ? ? ?  ? ?Home Medications ?Prior to Admission medications   ?Medication Sig Start Date End Date Taking? Authorizing Provider  ?amiodarone (PACERONE) 200 MG tablet TAKE 1 TABLET ONCE DAILY. ?Patient taking differently: Take 200 mg by mouth daily. 12/14/16   Marinus Maw, MD  ?aspirin EC 81 MG tablet Take 1 tablet (81 mg total) by mouth 2 (two) times daily. 02/19/21   Cristie Hem, PA-C  ?atorvastatin (LIPITOR) 80 MG tablet Take 1 tablet (80 mg total) by mouth daily at 6 PM. 11/17/19   Arty Baumgartner, NP  ?bisoprolol (ZEBETA) 5 MG tablet TAKE 1 TABLET ONCE DAILY. ?Patient taking differently: Take 5 mg by mouth daily. 05/22/20   Marinus Maw, MD  ?budesonide-formoterol [facility]) 160-4.5 MCG/ACT inhaler Inhale 2 puffs into the lungs 2 (two) times daily as needed (shortness of breath).    [provider]  ?cetirizine (ZYRTEC) 10 MG tablet Take 10 mg by mouth daily.    [provider]  ?clopidogrel (PLAVIX) 75 MG tablet Take 75 mg by mouth daily.    [provider]  ?diltiazem (CARDIZEM CD) 240 MG 24 hr capsule Take 1 capsule (240 mg total) by mouth daily. 05/22/20   Marinus Maw, MD  ?docusate sodium (COLACE) 100 MG capsule Take 1 capsule (100 mg total) by mouth daily as needed. 02/19/21 02/19/22  Cristie Hem, PA-C  ?gabapentin (NEURONTIN) 300 MG capsule Take 600-900 mg by mouth See admin instructions. Take 600 mg in the morning, 600 mg at lunch, and 900 mg at night 01/16/21   [provider]  ?HYDROcodone-acetaminophen (NORCO) 5-325 MG tablet Take 1-2 tablets by mouth 2 (two) times daily as needed for moderate pain. 04/11/21   Cristie Hem, PA-C  ?HYDROmorphone (DILAUDID) 2 MG tablet Take 1 tablet (2 mg total) by mouth every 4 (four) hours as needed for severe pain. 02/27/21   Tarry Kos, MD  ?isosorbide mononitrate (IMDUR) 60 MG 24 hr tablet Take 1 tablet (60 mg total) by mouth daily. 05/22/20   Marinus Maw, MD  ?ketorolac (TORADOL) 10 MG tablet Take 1 tablet (10 mg total) by mouth 2 (two) times daily as needed. 02/24/21   Tarry Kos, MD  ?lactulose (CHRONULAC) 10 GM/15ML solution Take 15 mLs by mouth 2 (two) times daily as needed for moderate constipation. 12/21/20   [provider]  ?levalbuterol (XOPENEX HFA) 45 MCG/ACT inhaler Inhale 2 puffs into the lungs every 6 (six) hours as needed for wheezing or shortness of breath.    [provider]  ?levalbuterol (XOPENEX) 1.25 MG/3ML nebulizer solution Take 1.25 mg by nebulization every 6 (six) hours as needed for shortness of breath or wheezing. 01/01/20   [provider]  ?metFORMIN (GLUCOPHAGE) 1000 MG tablet Take 1,000 mg by mouth 2 (two) times daily. 08/17/15   [provider]  ?methocarbamol (ROBAXIN) 750 MG tablet Take 1 tablet (750 mg total) by mouth 3 (three) times daily as needed for muscle spasms. 03/05/21   Cristie HemStanbery, Mary L, PA-C  ?montelukast (SINGULAIR) 10 MG tablet Take 10 mg by mouth every morning.     [provider]   ?multivitamin-iron-minerals-folic acid (CENTRUM) chewable tablet Chew 1 tablet by mouth daily.    [provider]  ?nitroGLYCERIN (NITROSTAT) 0.4 MG SL tablet Place 1 tablet (0.4 mg total) under the tongue every 5 (five) minutes x 3 doses as needed for chest pain. 11/17/19   Arty Baumgartneroberts, Lindsay B, NP  ?ondansetron (ZOFRAN) 4 MG tablet Take 1 tablet (4 mg total) by mouth every 8 (eight) hours as needed for nausea or vomiting. 03/05/21   Cristie HemStanbery, Mary L, PA-C  ?oxyCODONE-acetaminophen (PERCOCET) 10-325 MG tablet Take 1 tablet by mouth 2 (two) times daily as needed for pain. 03/29/21   Tarry KosXu, Naiping M, MD  ?oxyCODONE-acetaminophen (PERCOCET) 5-325 MG tablet Take 1-2 tablets by mouth 2 (two) times daily as needed. 03/27/21   Cristie HemStanbery, Mary L, PA-C  ?pantoprazole (PROTONIX) 40 MG tablet Take 40 mg by mouth daily. 09/26/19   [provider]  ?sucralfate (CARAFATE) 1 g tablet Take 1 tablet (1 g total) by mouth 4 (four) times daily -  with meals and at bedtime. ?Patient taking differently: Take 1 g by mouth daily as needed (upset stomach). 07/09/17   Rolan BuccoBelfi, Glorious Flicker, MD  ?sulfamethoxazole-trimethoprim (BACTRIM DS) 800-160 MG tablet Take 1 tablet by mouth 2 (two) times daily. To be taken after surgery 02/19/21   Cristie HemStanbery, Mary L, PA-C  ?   ? ?Allergies    ?Prednisone   ? ?Review of Systems   ?Review of Systems  ?Constitutional:  Negative for chills, diaphoresis, fatigue and fever.  ?HENT:  Negative for congestion, rhinorrhea and sneezing.   ?Eyes: Negative.   ?Respiratory:  Negative for cough, chest tightness and shortness of breath.   ?Cardiovascular:  Negative for chest pain and leg swelling.  ?Gastrointestinal:  Positive for rectal pain. Negative for abdominal pain, blood in stool, diarrhea, nausea and vomiting.  ?Genitourinary:  Negative for difficulty urinating, flank pain, frequency and hematuria.  ?Musculoskeletal:  Negative for arthralgias and back pain.  ?Skin:  Positive for wound. Negative for rash.   ?Neurological:  Negative for dizziness, speech difficulty, weakness, numbness and headaches.  ? ?Physical Exam ?Updated Vital Signs ?BP (!) 115/58 (BP Location: Right Arm)   Pulse 71   Temp 98.3 ?F (36.8 ?C) (Oral)   Resp 16   Ht 6\' 6"  (1.981 m)   Wt 109.3 kg   SpO2 94%   BMI 27.85 kg/m?  ?Physical Exam ?Constitutional:   ?   Appearance: He is well-developed.  ?HENT:  ?   Head: Normocephalic and atraumatic.  ?Eyes:  ?   Pupils: Pupils are equal, round, and reactive to light.  ?Cardiovascular:  ?   Rate and Rhythm: Normal rate and regular rhythm.  ?   Heart sounds: Normal heart sounds.  ?  Pulmonary:  ?   Effort: Pulmonary effort is normal. No respiratory distress.  ?   Breath sounds: Normal breath sounds. No wheezing or rales.  ?Chest:  ?   Chest wall: No tenderness.  ?Abdominal:  ?   General: Bowel sounds are normal.  ?   Palpations: Abdomen is soft.  ?   Tenderness: There is no abdominal tenderness. There is no guarding or rebound.  ?Genitourinary: ?   Comments: Patient has a large indurated erythematous area to his left buttocks extending down to the perirectal area.  He has a darker area of necrotic looking tissue in the underside of his buttocks.  No drainage is noted.  There is a prior area on the external part of his buttocks that was at the prior ED visit.  There is no drainage from this area. ?Musculoskeletal:     ?   General: Normal range of motion.  ?   Cervical back: Normal range of motion and neck supple.  ?Lymphadenopathy:  ?   Cervical: No cervical adenopathy.  ?Skin: ?   General: Skin is warm and dry.  ?   Findings: No rash.  ?Neurological:  ?   Mental Status: He is alert and oriented to person, place, and time.  ? ? ?ED Results / Procedures / Treatments   ?Labs ?(all labs ordered are listed, but only abnormal results are displayed) ?Labs Reviewed  ?BASIC METABOLIC PANEL - Abnormal; Notable for the following components:  ?    Result Value  ? Sodium 133 (*)   ? Chloride 94 (*)   ? Glucose, Bld  143 (*)   ? Calcium 8.6 (*)   ? All other components within normal limits  ?CBC WITH DIFFERENTIAL/PLATELET - Abnormal; Notable for the following components:  ? WBC 24.5 (*)   ? Neutro Abs 21.0 (*)   ? Monocytes Absolute 1.5 (*)   ? Abs Immat

## 2021-11-11 NOTE — ED Notes (Signed)
[facility] at bedside for transportation.  ?

## 2021-11-12 ENCOUNTER — Encounter (HOSPITAL_COMMUNITY): Payer: Self-pay | Admitting: Surgery

## 2021-11-12 DIAGNOSIS — Z8679 Personal history of other diseases of the circulatory system: Secondary | ICD-10-CM

## 2021-11-12 DIAGNOSIS — G8929 Other chronic pain: Secondary | ICD-10-CM | POA: Diagnosis present

## 2021-11-12 DIAGNOSIS — N493 Fournier gangrene: Secondary | ICD-10-CM

## 2021-11-12 DIAGNOSIS — E871 Hypo-osmolality and hyponatremia: Secondary | ICD-10-CM | POA: Diagnosis not present

## 2021-11-12 DIAGNOSIS — R338 Other retention of urine: Secondary | ICD-10-CM | POA: Diagnosis not present

## 2021-11-12 DIAGNOSIS — K612 Anorectal abscess: Secondary | ICD-10-CM | POA: Diagnosis present

## 2021-11-12 DIAGNOSIS — B952 Enterococcus as the cause of diseases classified elsewhere: Secondary | ICD-10-CM | POA: Diagnosis present

## 2021-11-12 DIAGNOSIS — I471 Supraventricular tachycardia: Secondary | ICD-10-CM | POA: Diagnosis present

## 2021-11-12 DIAGNOSIS — I10 Essential (primary) hypertension: Secondary | ICD-10-CM

## 2021-11-12 DIAGNOSIS — L03317 Cellulitis of buttock: Secondary | ICD-10-CM | POA: Diagnosis present

## 2021-11-12 DIAGNOSIS — I2510 Atherosclerotic heart disease of native coronary artery without angina pectoris: Secondary | ICD-10-CM | POA: Diagnosis not present

## 2021-11-12 DIAGNOSIS — L0231 Cutaneous abscess of buttock: Secondary | ICD-10-CM | POA: Diagnosis present

## 2021-11-12 DIAGNOSIS — J449 Chronic obstructive pulmonary disease, unspecified: Secondary | ICD-10-CM | POA: Diagnosis present

## 2021-11-12 DIAGNOSIS — F17210 Nicotine dependence, cigarettes, uncomplicated: Secondary | ICD-10-CM | POA: Diagnosis present

## 2021-11-12 DIAGNOSIS — M726 Necrotizing fasciitis: Secondary | ICD-10-CM | POA: Diagnosis present

## 2021-11-12 DIAGNOSIS — E1152 Type 2 diabetes mellitus with diabetic peripheral angiopathy with gangrene: Secondary | ICD-10-CM | POA: Diagnosis present

## 2021-11-12 DIAGNOSIS — F17200 Nicotine dependence, unspecified, uncomplicated: Secondary | ICD-10-CM

## 2021-11-12 DIAGNOSIS — D649 Anemia, unspecified: Secondary | ICD-10-CM | POA: Diagnosis present

## 2021-11-12 DIAGNOSIS — K610 Anal abscess: Secondary | ICD-10-CM | POA: Diagnosis not present

## 2021-11-12 DIAGNOSIS — I480 Paroxysmal atrial fibrillation: Secondary | ICD-10-CM | POA: Diagnosis not present

## 2021-11-12 DIAGNOSIS — Z7901 Long term (current) use of anticoagulants: Secondary | ICD-10-CM | POA: Diagnosis not present

## 2021-11-12 DIAGNOSIS — Z20822 Contact with and (suspected) exposure to covid-19: Secondary | ICD-10-CM | POA: Diagnosis present

## 2021-11-12 DIAGNOSIS — F419 Anxiety disorder, unspecified: Secondary | ICD-10-CM

## 2021-11-12 DIAGNOSIS — K219 Gastro-esophageal reflux disease without esophagitis: Secondary | ICD-10-CM | POA: Diagnosis present

## 2021-11-12 DIAGNOSIS — E119 Type 2 diabetes mellitus without complications: Secondary | ICD-10-CM

## 2021-11-12 DIAGNOSIS — Z7902 Long term (current) use of antithrombotics/antiplatelets: Secondary | ICD-10-CM | POA: Diagnosis not present

## 2021-11-12 DIAGNOSIS — I252 Old myocardial infarction: Secondary | ICD-10-CM | POA: Diagnosis not present

## 2021-11-12 DIAGNOSIS — E785 Hyperlipidemia, unspecified: Secondary | ICD-10-CM | POA: Diagnosis present

## 2021-11-12 DIAGNOSIS — K6289 Other specified diseases of anus and rectum: Secondary | ICD-10-CM | POA: Diagnosis present

## 2021-11-12 DIAGNOSIS — E1169 Type 2 diabetes mellitus with other specified complication: Secondary | ICD-10-CM | POA: Diagnosis present

## 2021-11-12 DIAGNOSIS — I456 Pre-excitation syndrome: Secondary | ICD-10-CM

## 2021-11-12 LAB — GLUCOSE, CAPILLARY
Glucose-Capillary: 106 mg/dL — ABNORMAL HIGH (ref 70–99)
Glucose-Capillary: 141 mg/dL — ABNORMAL HIGH (ref 70–99)
Glucose-Capillary: 156 mg/dL — ABNORMAL HIGH (ref 70–99)
Glucose-Capillary: 171 mg/dL — ABNORMAL HIGH (ref 70–99)
Glucose-Capillary: 248 mg/dL — ABNORMAL HIGH (ref 70–99)
Glucose-Capillary: 98 mg/dL (ref 70–99)

## 2021-11-12 LAB — BASIC METABOLIC PANEL
Anion gap: 7 (ref 5–15)
BUN: 14 mg/dL (ref 6–20)
CO2: 28 mmol/L (ref 22–32)
Calcium: 8.1 mg/dL — ABNORMAL LOW (ref 8.9–10.3)
Chloride: 99 mmol/L (ref 98–111)
Creatinine, Ser: 1.01 mg/dL (ref 0.61–1.24)
GFR, Estimated: >60 mL/min (ref >60)
Glucose, Bld: 176 mg/dL — ABNORMAL HIGH (ref 70–99)
Potassium: 5.1 mmol/L (ref 3.5–5.1)
Sodium: 134 mmol/L — ABNORMAL LOW (ref 135–145)

## 2021-11-12 LAB — CBC
HCT: 35.7 % — ABNORMAL LOW (ref 39.0–52.0)
Hemoglobin: 12 g/dL — ABNORMAL LOW (ref 13.0–17.0)
MCH: 31.6 pg (ref 26.0–34.0)
MCHC: 33.6 g/dL (ref 30.0–36.0)
MCV: 93.9 fL (ref 80.0–100.0)
Platelets: 304 10*3/uL (ref 150–400)
RBC: 3.8 MIL/uL — ABNORMAL LOW (ref 4.22–5.81)
RDW: 12.8 % (ref 11.5–15.5)
WBC: 17.5 10*3/uL — ABNORMAL HIGH (ref 4.0–10.5)
nRBC: 0 % (ref 0.0–0.2)

## 2021-11-12 LAB — HEMOGLOBIN A1C
Hgb A1c MFr Bld: 8.4 % — ABNORMAL HIGH (ref 4.8–5.6)
Mean Plasma Glucose: 194.38 mg/dL

## 2021-11-12 MED ORDER — CHLORHEXIDINE GLUCONATE CLOTH 2 % EX PADS
6.0000 | MEDICATED_PAD | Freq: Every day | CUTANEOUS | Status: DC
  Administered 2021-11-12 – 2021-11-14 (×3): 6 via TOPICAL

## 2021-11-12 MED ORDER — DAKINS (1/4 STRENGTH) 0.125 % EX SOLN
Freq: Two times a day (BID) | CUTANEOUS | Status: DC
  Filled 2021-11-12: qty 473

## 2021-11-12 MED ORDER — ATORVASTATIN CALCIUM 40 MG PO TABS
40.0000 mg | ORAL_TABLET | Freq: Every day | ORAL | Status: DC
  Administered 2021-11-12 – 2021-11-13 (×2): 40 mg via ORAL
  Filled 2021-11-12 (×2): qty 1

## 2021-11-12 MED ORDER — PANTOPRAZOLE SODIUM 40 MG PO TBEC
40.0000 mg | DELAYED_RELEASE_TABLET | Freq: Every day | ORAL | Status: DC
  Administered 2021-11-12 – 2021-11-13 (×2): 40 mg via ORAL
  Filled 2021-11-12 (×2): qty 1

## 2021-11-12 MED ORDER — LEVALBUTEROL HCL 0.63 MG/3ML IN NEBU: 0.6300 mg | INHALATION_SOLUTION | Freq: Four times a day (QID) | RESPIRATORY_TRACT | Status: DC | PRN

## 2021-11-12 MED ORDER — NICOTINE 21 MG/24HR TD PT24
21.0000 mg | MEDICATED_PATCH | Freq: Every day | TRANSDERMAL | Status: DC
  Administered 2021-11-12 – 2021-11-14 (×3): 21 mg via TRANSDERMAL
  Filled 2021-11-12 (×3): qty 1

## 2021-11-12 MED ORDER — TAMSULOSIN HCL 0.4 MG PO CAPS
0.4000 mg | ORAL_CAPSULE | Freq: Every day | ORAL | Status: DC
Start: 2021-11-12 — End: 2021-11-14
  Administered 2021-11-12 – 2021-11-14 (×3): 0.4 mg via ORAL
  Filled 2021-11-12 (×3): qty 1

## 2021-11-12 MED ORDER — POLYETHYLENE GLYCOL 3350 17 G PO PACK
17.0000 g | PACK | Freq: Every day | ORAL | Status: DC
  Administered 2021-11-12 – 2021-11-14 (×3): 17 g via ORAL
  Filled 2021-11-12 (×3): qty 1

## 2021-11-12 MED ORDER — OXYCODONE HCL 5 MG PO TABS
10.0000 mg | ORAL_TABLET | ORAL | Status: DC | PRN
  Administered 2021-11-12 – 2021-11-14 (×9): 15 mg via ORAL
  Filled 2021-11-12 (×9): qty 3

## 2021-11-12 NOTE — Progress Notes (Signed)
?PROGRESS NOTE ? ?Dean [name] [id number] DOB: September 16, 1970  ? ?PCP: Jaynee Eagles, MD ? ?Patient is from: Home.  Lives with his wife. ? ?DOA: 11/11/2021 LOS: 0 ? ?Chief complaints ?Chief Complaint  ?Patient presents with  ? Rectal Pain  ?  ? 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  He was a started on vancomycin, Zosyn and clindamycin.  Hospitalist service consulted for help with medical management given his chronic comorbidities. ? ?Postoperatively, patient has acute urine retention with bladder over distention.  He had 1.1 L on I&O cath.  He has no history of BPH or urinary retention.  Foley catheter replaced.  ? ?Subjective: ?Seen and examined earlier this morning.  Patient had urinary tension with removal of 1.1 L urine on I&O cath early in the morning.  He is a sleepy this morning but wakes to voice.  He says he did not have good sleep due to pain.  He denies chest pain, dyspnea, palpitation, GI or UTI symptoms.  He denies history of BPH or urinary symptoms.  Asking if I can update his wife. ? ?Objective: ?Vitals:  ? 11/12/21 0012 11/12/21 0509 11/12/21 0837 11/12/21 1203  ?BP: 131/62 (!) 135/58  139/85  ?Pulse: 80 83  87  ?Resp: 20 20  19   ?Temp: 100 ?F (37.8 ?C) 99.5 ?F (37.5 ?C)  99.3 ?F (37.4 ?C)  ?TempSrc: Oral Oral    ?SpO2: 98% 91% 90% 91%  ?Weight:      ?Height:      ? ? ?Examination: ? ?GENERAL: No apparent distress.  Nontoxic. ?HEENT: MMM.  Vision and hearing grossly intact.  ?NECK: Supple.  No apparent JVD.  ?RESP:  No IWOB.  Fair aeration bilaterally. ?CVS:  RRR. Heart sounds normal.  ?ABD/GI/GU: BS+. Abd soft, NTND.  ?MSK/EXT:  Moves extremities. No apparent deformity. No edema.  ?SKIN: See picture under  media ?NEURO: Awake, alert and oriented appropriately.  No apparent focal neuro deficit. ?PSYCH: Calm. Normal affect.  ? ?Procedures:  ?3/28-I&D of left buttock abscess, and debridement of left gluteal fournier's gangrene ? ?Microbiology summarized: ?3/28-blood cultures NGTD ?3/28-deep tissue cultures pending.  Gram stain with few GPR and moderate GPC's ? 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 Dema Severin on 3/28.  Deep tissue cultures pending.  Gram stain with GPR and moderate GPC's. ?-On broad-spectrum antibiotics per primary. ? ?Acute urinary retention ?Patient had 1.1 L urine out, on I&O cath after Foley was discontinued.  No prior history of BPH or urinary retention.  Likely from opiates and anesthesia. ?-Replace Foley catheter-May need Foley catheter at least for a week for bladder rest ?-Voiding trial outpatient ? ?CAD s/p DES to distal RCA and proximal LAD in 2021 ?No cardiopulmonary symptoms. ?-Continue home meds-Imdur, metoprolol and Lipitor  ?-Per his wife, patient has not taken Plavix in months ?-Patient has upcoming appointment with cardiologist on 4/21. ? ?Paroxysmal A-fib and history of PSVT ?In sinus rhythm with PVCs.  CHA2DS2-VASc score at least 4.  Seems to be on amiodarone, Cardizem and bisoprolol.  He also carries history of WPW syndrome.  Per wife, patient has not taken his Eliquis in months. ?-Continue home amiodarone, bisoprolol and Cardizem ?-Patient has upcoming appointment with cardiologist on 12/05/2021. ? ?History of CVA (cerebrovascular accident) ?Continue home meds-Plavix, aspirin and Lipitor ? ?COPD (chronic obstructive pulmonary disease) (HCC) ?Stable. ?-Continue home Dulera and Singulair. ?-Change albuterol to Xopenex given his arrhythmia history ? ?Diabetes mellitus type II, non insulin dependent (HCC) ?Uncontrolled.  A1c 8.4%.  On metformin at  home. ?Recent Labs  ?Lab 11/11/21 ?1734 11/11/21 ?2114 11/12/21 ?0007 11/12/21 ?0354 11/12/21 ?2706  ?GLUCAP 150* 315* 98 156* 106*  ?Continue SSI-moderate ?Continue Lipitor ? ? ?Anxiety ?Continue home Xanax ? ?Tobacco use disorder ?Encourage tobacco cessation ?Continue nicotine patch ? ?Essential hypertension ?Cardiac meds as above ? ? ? ? ?DVT prophylaxis:  ?enoxaparin (LOVENOX) injection 40 mg Start: 11/12/21 1000 ?SCDs Start: 11/11/21 1458 ? ?Code Status: Full code ?Family Communication: Updated patient's wife over the phone with patient's request ?Level of care: Telemetry ?Status is: Inpatient ? ? ?Final disposition: Per primary ? ?Consultants:  ?We are ? ?Sch Meds:  ?Scheduled Meds: ? amiodarone  200 mg Oral Daily  ? atorvastatin  40 mg Oral q1800  ? bisoprolol  5 mg Oral Daily  ? diltiazem  240 mg Oral Daily  ? docusate sodium  100 mg Oral BID  ? enoxaparin (LOVENOX) injection  40 mg Subcutaneous Q24H  ? insulin aspart  0-15 Units Subcutaneous Q4H  ? isosorbide mononitrate  60 mg Oral Daily  ? mometasone-formoterol  2 puff Inhalation BID  ? montelukast  10 mg Oral QHS  ? nicotine  21 mg Transdermal Daily  ? pantoprazole  40 mg Oral QHS  ? polyethylene glycol  17 g Oral Daily  ? sodium hypochlorite   Irrigation BID  ? tamsulosin  0.4 mg Oral QPC breakfast  ? ?Continuous Infusions: ? sodium chloride 75 mL/hr at 11/11/21 1938  ? clindamycin (CLEOCIN) IV 900 mg (11/12/21 0749)  ? piperacillin-tazobactam (ZOSYN)  IV 3.375 g (11/12/21 2376)  ? vancomycin 1,500 mg (11/12/21 0912)  ? ?PRN Meds:.acetaminophen **OR** acetaminophen, alprazolam, diphenhydrAMINE **OR** diphenhydrAMINE, HYDROmorphone (DILAUDID) injection, levalbuterol, methocarbamol, metoprolol tartrate, ondansetron **OR** ondansetron (ZOFRAN) IV, oxyCODONE, simethicone ? ?Antimicrobials: ?Anti-infectives (From admission, onward)  ? ? Start     Dose/Rate Route Frequency Ordered Stop  ? 11/12/21 0800  vancomycin (VANCOREADY) IVPB 1500 mg/300 mL       ?  1,500 mg ?150 mL/hr over 120 Minutes Intravenous Every 12 hours 11/11/21 1939    ? 11/11/21 2200  clindamycin (CLEOCIN) IVPB 900 mg       ? 900 mg ?100 mL/hr over 30 Minutes Intravenous Every 8 hours 11/11/21 1923    ? 11/11/21 2030  vancomycin (VANCOREADY) IVPB 2000 mg/400 mL       ? 2,000 mg ?200 mL/hr over 120 Minutes Intravenous  Once 11/11/21 1935 11/11/21 2308  ? 11/11/21 2000  piperacillin-tazobactam (ZOSYN) IVPB 3.375 g       ? 3.375 g ?12.5 mL/hr over 240 Minutes Intravenous Every 8 hours 11/11/21 1935    ? 11/11/21 1515  piperacillin-tazobactam (ZOSYN) IVPB 3.375 g  Status:  Discontinued       ? 3.375 g ?12.5 mL/hr over 240 Minutes Intravenous Every 8 hours 11/11/21 1507 11/11/21 1943  ? 11/11/21 1230  piperacillin-tazobactam (ZOSYN) IVPB 3.375 g       ? 3.375 g ?100 mL/hr over 30 Minutes Intravenous  Once 11/11/21 1220 11/11/21 1418  ? ?  ? ? ? ?I have personally reviewed the  following labs and images: ?CBC: ?Recent Labs  ?Lab 11/11/21 ?1233 11/12/21 ?0334  ?WBC 24.5* 17.5*  ?NEUTROABS 21.0*  --   ?HGB 13.7 12.0*  ?HCT 40.0 35.7*  ?MCV 90.7 93.9  ?PLT 389 304  ? ?BMP &GFR ?Recent Labs  ?Lab 11/11/21 ?1233 11/12/21 ?0334  ?NA 133* 134*  ?K 4.6 5.1  ?CL 94* 99  ?CO2 31 28  ?GLUCOSE 143* 176*  ?BUN 15 14  ?CREATININE 1.19 1.01  ?CALCIUM 8.6* 8.1*  ? ?Estimated Creatinine Clearance: 114.4 mL/min (by C-G formula based on SCr of 1.01 mg/dL). ?Liver & Pancreas: ?No results for input(s): AST, ALT, ALKPHOS, BILITOT, PROT, ALBUMIN in the last 168 hours. ?No results for input(s): LIPASE, AMYLASE in the last 168 hours. ?No results for input(s): AMMONIA in the last 168 hours. ?Diabetic: ?Recent Labs  ?  11/11/21 ?1941 11/12/21 ?0334  ?HGBA1C 8.4* 8.4*  ? ?Recent Labs  ?Lab 11/11/21 ?1734 11/11/21 ?2114 11/12/21 ?0007 11/12/21 ?0354 11/12/21 ?[id number]  ?GLUCAP 150* 315* 98 156* 106*  ? ?Cardiac Enzymes: ?No results for input(s): CKTOTAL, CKMB, CKMBINDEX, TROPONINI in the last 168 hours. ?No results for input(s): PROBNP in  the last 8760 hours. ?Coagulation Profile: ?No results for input(s): INR, PROTIME in the last 168 hours. ?Thyroid Function Tests: ?No results for input(s): TSH, T4TOTAL, FREET4, T3FREE, THYROIDAB in the la

## 2021-11-12 NOTE — Progress Notes (Signed)
[facility] ?Progress Note ? ?1 Day Post-Op  ?Subjective: ?CC-  ?Having a lot of pain at surgical site. ?Foley removed last night and he has been unable to void. Required I&O cath x1. ? ?Objective: ?Vital signs in last 24 hours: ?Temp:  [98.3 ?F (36.8 ?C)-100.1 ?F (37.8 ?C)] 99.5 ?F (37.5 ?C) (03/29 0509) ?Pulse Rate:  [62-83] 83 (03/29 0509) ?Resp:  [12-21] 20 (03/29 0509) ?BP: (115-153)/(50-116) 135/58 (03/29 0509) ?SpO2:  [89 %-100 %] 90 % (03/29 0837) ?Weight:  [109.3 kg] 109.3 kg (03/28 1123) ?  ? ?Intake/Output from previous day: ?03/28 0701 - 03/29 0700 ?In: 2506.5 [P.O.:360; I.V.:1168.5; IV Piggyback:977.9] ?Out: 1800 [Urine:1700; Blood:100] ?Intake/Output this shift: ?No intake/output data recorded. ? ?PE: ?Gen:  Alert, NAD ?Left buttock: s/p I&D with 2 wounds connected by penrose drain, no purulent drainage noted, persistent induration noted proximally but no fluctuance, some fibrinous tissue noted at base of both wounds ? ? ? ?Lab Results:  ?Recent Labs  ?  11/11/21 ?1233 11/12/21 ?0334  ?WBC 24.5* 17.5*  ?HGB 13.7 12.0*  ?HCT 40.0 35.7*  ?PLT 389 304  ? ?BMET ?Recent Labs  ?  11/11/21 ?1233 11/12/21 ?0334  ?NA 133* 134*  ?K 4.6 5.1  ?CL 94* 99  ?CO2 31 28  ?GLUCOSE 143* 176*  ?BUN 15 14  ?CREATININE 1.19 1.01  ?CALCIUM 8.6* 8.1*  ? ?PT/INR ?No results for input(s): LABPROT, INR in the last 72 hours. ?CMP  ?   ?Component Value Date/Time  ? NA 134 (L) 11/12/2021 0334  ? K 5.1 11/12/2021 0334  ? CL 99 11/12/2021 0334  ? CO2 28 11/12/2021 0334  ? GLUCOSE 176 (H) 11/12/2021 0334  ? BUN 14 11/12/2021 0334  ? CREATININE 1.01 11/12/2021 0334  ? CREATININE 0.87 07/18/2015 1008  ? CALCIUM 8.1 (L) 11/12/2021 0334  ? PROT 6.8 03/10/2021 2352  ? ALBUMIN 3.6 03/10/2021 2352  ? AST 16 03/10/2021 2352  ? ALT 18 03/10/2021 2352  ? ALKPHOS 108 03/10/2021 2352  ? BILITOT 0.5 03/10/2021 2352  ? GFRNONAA >60 11/12/2021 0334  ? GFRAA >60 02/24/2020 0929  ? ?Lipase  ?   ?Component Value Date/Time  ? LIPASE 21  01/20/2021 0853  ? ? ? ? ? ?Studies/Results: ?CT PELVIS W CONTRAST ? ?Result Date: 11/11/2021 ?CLINICAL DATA:  Fever, perianal abscess EXAM: CT PELVIS WITH CONTRAST TECHNIQUE: Multidetector CT imaging of the pelvis was performed using the standard protocol following the bolus administration of intravenous contrast. RADIATION DOSE REDUCTION: This exam was performed according to the departmental dose-optimization program which includes automated exposure control, adjustment of the mA and/or kV according to patient size and/or use of iterative reconstruction technique. CONTRAST:  OMNIPAQUE IOHEXOL 300 MG/ML  SOLN COMPARISON:  CT done on 12/31/2020 FINDINGS: There is no free fluid in the pelvis. There is mild diffuse wall thickening in the sigmoid colon. Appendix is not dilated. There is marked inflammatory stranding in the left perianal region. There is 5.2 x 3.2 cm area of focal homogeneous density in the left perianal region. There is another area of low attenuation measuring approximally 4.4 x 2.1 cm in the medial left gluteal region. There are no pockets of air in the left perianal region. Evaluation of the urinary bladder is limited by beam hardening artifacts from orthopedic hardware. Degenerative changes are noted in the lower lumbar spine with facet hypertrophy. Bulging of annulus is noted at L5-S1 level causing extrinsic pressure over the ventral margin of thecal sac. There is encroachment  of neural foramina at L4-L5 and L5-S1 levels. There is previous left hip arthroplasty. No recent fracture is seen. There is no dislocation in the hips. SI joints are unremarkable. There are slightly enlarged lymph nodes in the both inguinal regions, more so on the left side. Largest of the nodes measures approximately 16 x 14 mm. Degenerative changes are noted in the right hip. IMPRESSION: There is marked inflammatory stranding in the fat planes in the left perianal region suggesting cellulitis. There is 5.2 x 3.2 cm  area of homogeneous opacity in the left perianal region immediately posterior to the anus. There is another smaller collection measuring 4.4 x 2.1 cm. Findings suggest possible left perianal cellulitis and abscesses. There is no demonstrable perirectal fluid collection. There is diffuse wall thickening in the sigmoid colon which may be due to incomplete distention or suggest nonspecific colitis. Previous left hip arthroplasty. Degenerative changes are noted in the right hip. Electronically Signed   By: Ernie AvenaPalani  Rathinasamy M.D.   On: 11/11/2021 13:53   ? ?Anti-infectives: ?Anti-infectives (From admission, onward)  ? ? Start     Dose/Rate Route Frequency Ordered Stop  ? 11/12/21 0800  vancomycin (VANCOREADY) IVPB 1500 mg/300 mL       ? 1,500 mg ?150 mL/hr over 120 Minutes Intravenous Every 12 hours 11/11/21 1939    ? 11/11/21 2200  clindamycin (CLEOCIN) IVPB 900 mg       ? 900 mg ?100 mL/hr over 30 Minutes Intravenous Every 8 hours 11/11/21 1923    ? 11/11/21 2030  vancomycin (VANCOREADY) IVPB 2000 mg/400 mL       ? 2,000 mg ?200 mL/hr over 120 Minutes Intravenous  Once 11/11/21 1935 11/11/21 2308  ? 11/11/21 2000  piperacillin-tazobactam (ZOSYN) IVPB 3.375 g       ? 3.375 g ?12.5 mL/hr over 240 Minutes Intravenous Every 8 hours 11/11/21 1935    ? 11/11/21 1515  piperacillin-tazobactam (ZOSYN) IVPB 3.375 g  Status:  Discontinued       ? 3.375 g ?12.5 mL/hr over 240 Minutes Intravenous Every 8 hours 11/11/21 1507 11/11/21 1943  ? 11/11/21 1230  piperacillin-tazobactam (ZOSYN) IVPB 3.375 g       ? 3.375 g ?100 mL/hr over 30 Minutes Intravenous  Once 11/11/21 1220 11/11/21 1418  ? ?  ? ? ? ?Assessment/Plan ?Fournier's gangrene-left buttock ?-POD#1 s/p Incision and drainage of left gluteal abscess x2; Debridement of Fournier's gangrene of the left buttock-skin, subcutaneous fat, fascia of left gluteus maximus, totaling 10 x 10 x 10 cm of tissue; Anorectal exam under anesthesia 3/28 Dr. Cliffton AstersWhite ?- cultures pending, gram  stain with FEW GRAM POSITIVE RODS and MODERATE GRAM POSITIVE COCCI ?- continue broad spectrum antibiotics and monitor cultures ?- Continue penrose drain ?- Will start BID wet to dry dressing changes. Pack both wounds with kerlex lightly moistened with dakins x3 days. ?- ok to shower with wounds open. ?- PT consult ? ?ID - currently clinda/ zosyn/ vancomycin ?FEN - HH/CM diet, IVF. Miralax/colace ?VTE - lovenox ?Foley - replace 3/29 for urinary retention ? ?Urinary retention - replace foley 3/29 and start flomax ?CAD s/p stents on plavix - will discuss with MD when to restart plavix ?Hx CVA ?HTN ?DM - A1c 8.4 ?HLD ?COPD ?Afib (s/p now ablation) - pt stopped eliquis on his own ?GERD  ?Chronic pain - takes Percocet 10/325mg  BID at home ?Tobacco abuse ? ? LOS: 0 days  ? ? ?Franne FortsBrooke A Tanveer Brammer, PA-C ?[facility] ?11/12/2021, 9:46 AM ?Please  see Amion for pager number during day hours 7:00am-4:30pm ? ?

## 2021-11-12 NOTE — Progress Notes (Addendum)
Foley catheter removed per patient. Patient c/o pain and discomfort. MD Magnus Ivan notified and aware. ?

## 2021-11-12 NOTE — Hospital Course (Addendum)
51 year old M with PMH of CAD s/p DES to distal RCA and proximal LAD in 2021, CVA, NIDDM-2, paroxysmal A-fib off AC, SVT, WPW, COPD and HTN admitted by his surgical team for left gluteal abscess and found to have large necrotic abscess with Fournier's gangrene in the left buttock intraoperatively.  He was a started on vancomycin, Zosyn and clindamycin.   ? ?Hospitalist service consulted for help with medical management given his chronic comorbidities. ? ?Postoperatively, patient has acute urine retention with bladder over distention.  He had 1.1 L on I&O cath.  He has no history of BPH or urinary retention.  Foley catheter replaced on 11/12/2020.  Discharged with Foley catheter at least for 1 week.  Outpatient follow-up with urology for voiding trial scheduled on 11/25/2021. ? ?In regards to cardiac issue, patient was started on Eliquis for A-fib given his significant risk for CVA after risk and benefit discussion.  He has upcoming appointment with his cardiologist later this month.  Aspirin and Plavix discontinued as he has not taken those medications in months. ? ?Regards to Fournier gangrene/abscess, deep tissue culture grew Enterococcus faecalis.  Blood cultures negative.  Antibiotic de-escalated to p.o. Augmentin by primary team. ?

## 2021-11-12 NOTE — Assessment & Plan Note (Addendum)
Continue Lipitor. ?Started Eliquis ?

## 2021-11-12 NOTE — Assessment & Plan Note (Signed)
-  Continue home Xanax ?

## 2021-11-12 NOTE — Assessment & Plan Note (Addendum)
No cardiopulmonary symptoms. ?-Continue home meds-Imdur, metoprolol and Lipitor  ?-Per his wife, patient has not taken Plavix or aspirin in months.  ?-Patient has upcoming appointment with cardiologist on 4/21. ?-Can hold Plavix until follow-up with his cardiologist since we have started Eliquis ?

## 2021-11-12 NOTE — Assessment & Plan Note (Addendum)
S/p I&D of left gluteal abscess x2, debridement of Fournier's gangrene and anorectal exam by Dr. Cliffton Asters on 3/28.  Deep tissue cultures pending.  Gram stain with GPR and moderate GPC's.  Cultures with Enterococcus faecalis.  Blood culture NGTD. ?-Received vancomycin, Zosyn and clindamycin in house ?-Antibiotics de-escalated to p.o. Augmentin by primary team. ?-Surgical wound care per primary team. ?

## 2021-11-12 NOTE — Progress Notes (Addendum)
Patient feels the urge to void but nothing is coming out, tried to ambulate in the room, stand at bedside, walk to the bathroom and turned the faucet on, but still patient not able to void, Bladder scan shows 850, notified NP Olena Heckle and ordered In and out cath - 1100 ml output, yellow clear. ?

## 2021-11-12 NOTE — Assessment & Plan Note (Signed)
Cardiac meds as above ?

## 2021-11-12 NOTE — Assessment & Plan Note (Addendum)
Patient had 1.1 L urine on I&O cath after Foley was discontinued the morning of 3/29.  No prior history of BPH or urinary retention.  Likely from surgery, opiates and anesthesia. ?-Continue bladder rest with Foley catheter at least for 1 week ?-Voiding trial outpatient ?

## 2021-11-12 NOTE — Assessment & Plan Note (Addendum)
-   Continue home meds °

## 2021-11-12 NOTE — Assessment & Plan Note (Signed)
Encourage tobacco cessation ?Continue nicotine patch ?

## 2021-11-12 NOTE — Assessment & Plan Note (Addendum)
Continue home metformin. ?He could benefit from GLP-1 agonist with cardiovascular benefit. ?Continue home statin ? ?

## 2021-11-12 NOTE — Assessment & Plan Note (Addendum)
In sinus rhythm with PVCs.  CHA2DS2-VASc score at least 4.  Seems to be on amiodarone, Cardizem and bisoprolol.  He also carries history of WPW syndrome.  Per wife, patient has not taken his Eliquis in months.  After risk and benefit discussion, we have agreed to start Eliquis for anticoagulation.  ?-Continue home amiodarone, bisoprolol and Cardizem ?-Resumed Eliquis at 5 mg twice daily after blessing from surgical team ?-Patient has upcoming appointment with cardiologist on 12/05/2021. ?

## 2021-11-13 DIAGNOSIS — E538 Deficiency of other specified B group vitamins: Secondary | ICD-10-CM

## 2021-11-13 DIAGNOSIS — E871 Hypo-osmolality and hyponatremia: Secondary | ICD-10-CM

## 2021-11-13 DIAGNOSIS — D649 Anemia, unspecified: Secondary | ICD-10-CM

## 2021-11-13 LAB — MAGNESIUM: Magnesium: 1.8 mg/dL (ref 1.7–2.4)

## 2021-11-13 LAB — IRON AND TIBC
Iron: 51 ug/dL (ref 45–182)
Saturation Ratios: 24 % (ref 17.9–39.5)
TIBC: 216 ug/dL — ABNORMAL LOW (ref 250–450)
UIBC: 165 ug/dL

## 2021-11-13 LAB — GLUCOSE, CAPILLARY
Glucose-Capillary: 102 mg/dL — ABNORMAL HIGH (ref 70–99)
Glucose-Capillary: 142 mg/dL — ABNORMAL HIGH (ref 70–99)
Glucose-Capillary: 151 mg/dL — ABNORMAL HIGH (ref 70–99)
Glucose-Capillary: 155 mg/dL — ABNORMAL HIGH (ref 70–99)
Glucose-Capillary: 168 mg/dL — ABNORMAL HIGH (ref 70–99)
Glucose-Capillary: 211 mg/dL — ABNORMAL HIGH (ref 70–99)

## 2021-11-13 LAB — RETICULOCYTES
Immature Retic Fract: 11.4 % (ref 2.3–15.9)
RBC.: 3.09 MIL/uL — ABNORMAL LOW (ref 4.22–5.81)
Retic Count, Absolute: 30 10*3/uL (ref 19.0–186.0)
Retic Ct Pct: 1 % (ref 0.4–3.1)

## 2021-11-13 LAB — RENAL FUNCTION PANEL
Albumin: 2.6 g/dL — ABNORMAL LOW (ref 3.5–5.0)
Anion gap: 6 (ref 5–15)
BUN: 10 mg/dL (ref 6–20)
CO2: 25 mmol/L (ref 22–32)
Calcium: 7.7 mg/dL — ABNORMAL LOW (ref 8.9–10.3)
Chloride: 98 mmol/L (ref 98–111)
Creatinine, Ser: 0.81 mg/dL (ref 0.61–1.24)
GFR, Estimated: >60 mL/min
Glucose, Bld: 134 mg/dL — ABNORMAL HIGH (ref 70–99)
Phosphorus: 5.4 mg/dL — ABNORMAL HIGH (ref 2.5–4.6)
Potassium: 4.5 mmol/L (ref 3.5–5.1)
Sodium: 129 mmol/L — ABNORMAL LOW (ref 135–145)

## 2021-11-13 LAB — CBC
HCT: 29.2 % — ABNORMAL LOW (ref 39.0–52.0)
Hemoglobin: 9.8 g/dL — ABNORMAL LOW (ref 13.0–17.0)
MCH: 31.7 pg (ref 26.0–34.0)
MCHC: 33.6 g/dL (ref 30.0–36.0)
MCV: 94.5 fL (ref 80.0–100.0)
Platelets: 286 10*3/uL (ref 150–400)
RBC: 3.09 MIL/uL — ABNORMAL LOW (ref 4.22–5.81)
RDW: 13 % (ref 11.5–15.5)
WBC: 12.1 10*3/uL — ABNORMAL HIGH (ref 4.0–10.5)
nRBC: 0 % (ref 0.0–0.2)

## 2021-11-13 LAB — VITAMIN B12: Vitamin B-12: 178 pg/mL — ABNORMAL LOW (ref 180–914)

## 2021-11-13 LAB — FERRITIN: Ferritin: 251 ng/mL (ref 24–336)

## 2021-11-13 LAB — FOLATE: Folate: 7.9 ng/mL

## 2021-11-13 MED ORDER — CYANOCOBALAMIN 1000 MCG/ML IJ SOLN
1000.0000 ug | Freq: Every day | INTRAMUSCULAR | Status: DC
  Administered 2021-11-13: 1000 ug via INTRAMUSCULAR
  Filled 2021-11-13 (×2): qty 1

## 2021-11-13 MED ORDER — ACETAMINOPHEN 500 MG PO TABS
1000.0000 mg | ORAL_TABLET | Freq: Three times a day (TID) | ORAL | Status: DC
  Administered 2021-11-13 – 2021-11-14 (×3): 1000 mg via ORAL
  Filled 2021-11-13 (×3): qty 2

## 2021-11-13 MED ORDER — ENSURE ENLIVE PO LIQD
237.0000 mL | Freq: Two times a day (BID) | ORAL | Status: DC
  Administered 2021-11-13 – 2021-11-14 (×2): 237 mL via ORAL

## 2021-11-13 MED ORDER — APIXABAN 5 MG PO TABS
5.0000 mg | ORAL_TABLET | Freq: Two times a day (BID) | ORAL | Status: DC
  Administered 2021-11-13 – 2021-11-14 (×3): 5 mg via ORAL
  Filled 2021-11-13 (×3): qty 1

## 2021-11-13 MED ORDER — HYDROMORPHONE HCL 1 MG/ML IJ SOLN
0.5000 mg | Freq: Four times a day (QID) | INTRAMUSCULAR | Status: DC | PRN
  Administered 2021-11-13 – 2021-11-14 (×3): 0.5 mg via INTRAVENOUS
  Filled 2021-11-13 (×3): qty 0.5

## 2021-11-13 MED ORDER — ENSURE MAX PROTEIN PO LIQD
11.0000 [oz_av] | Freq: Every day | ORAL | Status: DC
  Administered 2021-11-13: 11 [oz_av] via ORAL
  Filled 2021-11-13 (×2): qty 330

## 2021-11-13 MED ORDER — METHOCARBAMOL 500 MG PO TABS
750.0000 mg | ORAL_TABLET | Freq: Four times a day (QID) | ORAL | Status: DC
  Administered 2021-11-13 – 2021-11-14 (×5): 750 mg via ORAL
  Filled 2021-11-13 (×5): qty 2

## 2021-11-13 MED ORDER — INSULIN ASPART 100 UNIT/ML IJ SOLN
0.0000 [IU] | Freq: Every day | INTRAMUSCULAR | Status: DC
  Administered 2021-11-13: 2 [IU] via SUBCUTANEOUS

## 2021-11-13 MED ORDER — INSULIN ASPART 100 UNIT/ML IJ SOLN
0.0000 [IU] | Freq: Three times a day (TID) | INTRAMUSCULAR | Status: DC
  Administered 2021-11-13: 3 [IU] via SUBCUTANEOUS
  Administered 2021-11-14: 5 [IU] via SUBCUTANEOUS

## 2021-11-13 MED ORDER — ADULT MULTIVITAMIN W/MINERALS CH
1.0000 | ORAL_TABLET | Freq: Every day | ORAL | Status: DC
  Administered 2021-11-13 – 2021-11-14 (×2): 1 via ORAL
  Filled 2021-11-13 (×2): qty 1

## 2021-11-13 MED ORDER — VITAMIN B-12 1000 MCG PO TABS: 1000.0000 ug | ORAL_TABLET | Freq: Every day | ORAL | Status: DC

## 2021-11-13 NOTE — Progress Notes (Signed)
Initial Nutrition Assessment ? ?DOCUMENTATION CODES:  ? ?Not applicable ? ?INTERVENTION:  ?- will order Ensure Plus High Protein BID, each supplement provides 350 kcal and 20 grams of protein. ?- will order Ensure Max once/day, each supplement provides 150 kcal and 30 grams of protein. ?- will order 1 tablet Multivitamin with minerals daily. ?- will place High Protein Foods handout in AVS. ? ?- will check serum vitamin A, vitamin C, and vitamin D d/t current medical dx. ?- RD will communicate with PCP if results return after d/c. ? ? ?NUTRITION DIAGNOSIS:  ? ?Increased nutrient needs related to acute illness, post-op healing as evidenced by estimated needs. ? ?GOAL:  ? ?Patient will meet greater than or equal to 90% of their needs ? ?MONITOR:  ? ?PO intake, Supplement acceptance, Labs, Weight trends, Skin ? ?REASON FOR ASSESSMENT:  ? ?Malnutrition Screening Tool ? ?ASSESSMENT:  ? ?51 year old male with medical history of CAD s/p DES to distal RCA and proximal LAD in 2021, CVA, type 2 DM, A-fib, SVT, WPW, COPD, asthma, pancreatitis, anxiety, GERD, STEMI, and HTN. He presented to the hospital at the direction of Surgical team due to L gluteal abscess and findings of large necrotic abscess with Fournier?s gangrene. ? ?Patient laying in bed with wife at bedside. Patient is POD #2 I&D of L gluteal abscess x2, debridement of Fournier's gangrene of L buttocks, subcutaneous fat, and fascia of L gluteus maximus. ? ?Patient and wife report he had a small breakfast; patient was seen before lunch time. Flow sheet documentation indicates he ate 100% of breakfast, no documentation of lunch meal.  ? 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 He will often eat a croissant breakfast sandwich for breakfast and have a dinner meal consisting of a protein and two veggies. Intakes between these meals are variable and inconsistent but may be something such as a sandwich or Nabs.   ? ?He denies any lingering issues from pancreatitis other than occasional nausea. He avoids nuts due to concern that they may cause discomfort.  ? ?Talked with patient and his wife about ways to increase protein intake during breakfast and dinner meals and encouraged him to have a protein shake (brand of his choice) and cheese sticks in his cooler at work to easily increased protein consumption during the day. ? ?Patient reports previous weight of 390 lb which was ~2 years ago. 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 ? ?Weight on 3/28 was 241 lb and it appears that weight has been stable since 02/12/21. ? ? ?Labs reviewed; CBGs: 155, 151, 142, and 168 mg/dl, Na: 129 mmol/l, Ca: 7.7 mg/dl. ? ?Medications reviewed; 1000 mcg oral cyanocobalamin/day + 1000 mcg IM/day 3-30-4/1,100 mg colace BID, sliding scale novolog, 40 mg oral protonix/day, 17 g miralax/day. ?  ? ?NUTRITION - FOCUSED PHYSICAL EXAM: ? ?Unable at this time.  ? ?Diet Order:   ?Diet Order   ? ?       ?  Diet heart healthy/carb modified Room service appropriate? Yes; Fluid consistency: Thin  Diet effective now       ?  ? ?  ?  ? ?  ? ? ?EDUCATION NEEDS:  ? ?Education needs have been addressed ? ?Skin:  Skin Assessment: Skin Integrity Issues: ?Skin Integrity Issues:: Incisions ?Incisions: L perinuem + L buttocks ? ?Last BM:  3/29 per nursing flow sheet ? ?Height:  ? ?  Ht Readings from Last 1 Encounters:  ?11/11/21 6\' 6"  (1.981 m)  ? ? ?Weight:  ? ?Wt Readings from Last 1 Encounters:  ?11/11/21 109.3 kg  ? ? ?BMI:  Body mass index is 27.85 kg/m?. ? ?Estimated Nutritional Needs:  ?Kcal:  2750-2950 kcal ?Protein:  135-150 grams ?Fluid:  >/= 3 L/day ? ? ? ?Jarome Matin, MS, RD, LDN ?Registered Dietitian II ?Inpatient Clinical Nutrition ?RD pager # and on-call/weekend pager # available in [place]  ? ?

## 2021-11-13 NOTE — Assessment & Plan Note (Addendum)
Slight drop in sodium.  Was on Bactrim prior to admission.  Was on IV fluid perioperatively.  Improved. ?-Recheck at follow-up ?

## 2021-11-13 NOTE — Evaluation (Addendum)
Physical Therapy Evaluation ?Patient Details ?Name: Dean [name] ?MRN: [id number] ?DOB: 12-06-1970 ?Today's Date: 11/13/2021 ? ?History of Present Illness ? 51 year old M with PMH of CAD s/p DES to distal RCA and proximal LAD in 2021, CVA, NIDDM-2, paroxysmal A-fib off AC, SVT, WPW, COPD and HTN, LTHA, admitted by his surgical team for left gluteal abscess and found to have large necrotic abscess with Fournier's gangrene in the left buttock intraoperatively  ?Clinical Impression ? Patient agreeable to getting up, stepped to recliner. Positioned in recliner to eat lunch. Patient shifts far to the right with pillows.  Wife present and has been assisting with mobility and reports frequent standing at bedside.. Patient has not ambulated any distance as of yet/ ? Patient declines HHPT.Pt admitted with above diagnosis.  Pt currently with functional limitations due to the deficits listed below (see PT Problem List). Pt will benefit from skilled PT to increase their independence and safety with mobility to allow discharge to the venue listed below.   ? ?   ? ?Recommendations for follow up therapy are one component of a multi-disciplinary discharge planning process, led by the attending physician.  Recommendations may be updated based on patient status, additional functional criteria and insurance authorization. ? ?Follow Up Recommendations No PT follow up  per pt. decline) ? ?  ?Assistance Recommended at Discharge Frequent or constant Supervision/Assistance  ?Patient can return home with the following ? A little help with walking and/or transfers;A little help with bathing/dressing/bathroom;Help with stairs or ramp for entrance;Assist for transportation ? ?  ?Equipment Recommendations  pressure redistribution chair pad - bariatric size-if available --for home  ?Recommendations for Other Services ?    ?  ?Functional Status Assessment Patient has had a recent decline in their functional status and demonstrates the ability to  make significant improvements in function in a reasonable and predictable amount of time.  ? ?  ?Precautions / Restrictions Precautions ?Precaution Comments: left  buttock wound, cardiac Hx  ? ?  ? ?Mobility ? Bed Mobility ?Overal bed mobility: Needs Assistance ?Bed Mobility: Supine to Sit ?  ?  ?Supine to sit: Supervision, HOB elevated ?  ?  ?General bed mobility comments: use of rail and  rolls to left side to place legs over edge, then pushes up ?  ? ?Transfers ?Overall transfer level: Needs assistance ?Equipment used: Rolling walker (2 wheels) ?Transfers: Sit to/from Stand, Bed to chair/wheelchair/BSC ?  ?  ?Step pivot transfers: Min guard, From elevated surface ?  ?  ?  ?General transfer comment: able to step to  recliner from bed x ~ 4 steps ?  ? ?Ambulation/Gait ?  ?  ?  ?  ?  ?  ?  ?  ? ?Stairs ?  ?  ?  ?  ?  ? ?Wheelchair Mobility ?  ? ?Modified Rankin (Stroke Patients Only) ?  ? ?  ? ?Balance Overall balance assessment: Needs assistance ?Sitting-balance support: Feet supported, Bilateral upper extremity supported ?Sitting balance-Leahy Scale: Fair ?  ?  ?Standing balance support: During functional activity, Bilateral upper extremity supported, Reliant on assistive device for balance ?Standing balance-Leahy Scale: Fair ?  ?  ?  ?  ?  ?  ?  ?  ?  ?  ?  ?  ?   ? ? ? ?Pertinent Vitals/Pain Pain Assessment ?Pain Assessment: 0-10 ?Pain Score: 10-Worst pain ever ?Pain Location: buttock , left posterior leg ?Pain Descriptors / Indicators: Aching, Discomfort, Grimacing, Guarding ?Pain Intervention(s): Limited activity within patient's  tolerance, Monitored during session, Premedicated before session, Repositioned  ? ? ?Home Living Family/patient expects to be discharged to:: Private residence ?Living Arrangements: Spouse/significant other;Children ?Available Help at Discharge: Family;Friend(s);Available PRN/intermittently ?Type of Home: House ?Home Access: Ramped entrance ?  ?  ?  ?Home Layout: One level ?Home  Equipment: Agricultural consultant (2 wheels);Rollator (4 wheels);BSC/3in1 ?   ?  ?Prior Function Prior Level of Function : Independent/Modified Independent;Working/employed ?  ?  ?  ?  ?  ?  ?  ?  ?  ? ? ?Hand Dominance  ? Dominant Hand: Right ? ?  ?Extremity/Trunk Assessment  ? Upper Extremity Assessment ?Upper Extremity Assessment: Overall WFL for tasks assessed ?  ? ?Lower Extremity Assessment ?Lower Extremity Assessment: Generalized weakness ?  ? ?Cervical / Trunk Assessment ?Cervical / Trunk Assessment: Other exceptions ?Cervical / Trunk Exceptions: decreased WB left leg, tilts trunk to left  ?Communication  ? Communication: No difficulties  ?Cognition Arousal/Alertness: Awake/alert ?Behavior During Therapy: [facility] for tasks assessed/performed ?Overall Cognitive Status: Within Functional Limits for tasks assessed ?  ?  ?  ?  ?  ?  ?  ?  ?  ?  ?  ?  ?  ?  ?  ?  ?  ?  ?  ? ?  ?General Comments   ? ?  ?Exercises    ? ?Assessment/Plan  ?  ?PT Assessment Patient needs continued PT services  ?PT Problem List Decreased mobility;Decreased range of motion;Decreased knowledge of precautions;Decreased activity tolerance;Pain;Decreased skin integrity ? ?   ?  ?PT Treatment Interventions DME instruction;Therapeutic activities;Gait training;Therapeutic exercise;Patient/family education;Functional mobility training   ? ?PT Goals (Current goals can be found in the Care Plan section)  ?Acute Rehab PT Goals ?Patient Stated Goal: to go home tomorrow ?PT Goal Formulation: With patient/family ?Time For Goal Achievement: 11/27/21 ?Potential to Achieve Goals: Good ? ?  ?Frequency Min 3X/week ?  ? ? ?Co-evaluation   ?  ?  ?  ?  ? ? ?  ?AM-PAC PT "6 Clicks" Mobility  ?Outcome Measure Help needed turning from your back to your side while in a flat bed without using bedrails?: A Little ?Help needed moving from lying on your back to sitting on the side of a flat bed without using bedrails?: A Little ?Help needed moving to and from a bed to a chair  (including a wheelchair)?: A Little ?Help needed standing up from a chair using your arms (e.g., wheelchair or bedside chair)?: A Little ?Help needed to walk in hospital room?: A Lot ?Help needed climbing 3-5 steps with a railing? : A Lot ?6 Click Score: 16 ? ?  ?End of Session   ?Activity Tolerance: Patient limited by pain ?Patient left: in chair;with call bell/phone within reach;with family/visitor present ?Nurse Communication: Mobility status ?PT Visit Diagnosis: Unsteadiness on feet (R26.81);Pain ?Pain - Right/Left: Left ?Pain - part of body: Leg ?  ? ?Time: 5400-8676 ?PT Time Calculation (min) (ACUTE ONLY): 17 min ? ? ?Charges:   PT Evaluation ?$PT Eval Low Complexity: 1 Low ?  ?  ?   ? ? ?Blanchard Kelch PT ?Acute Rehabilitation Services ?Pager [phone] ?Office [phone] ? ?Lendon George, Jobe Igo ?11/13/2021, 3:07 PM ? ?

## 2021-11-13 NOTE — Progress Notes (Signed)
?PROGRESS NOTE ? ?Dean [name] [id number] DOB: 19-Mar-1971  ? ?PCP: Jaynee Eagles, MD ? ?Patient is from: Home.  Lives with his wife. ? ?DOA: 11/11/2021 LOS: 1 ? ?Chief complaints ?Chief Complaint  ?Patient presents with  ? Rectal Pain  ?  ? 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  He was a started on vancomycin, Zosyn and clindamycin.  Hospitalist service consulted for help with medical management given his chronic comorbidities. ? ?Postoperatively, patient has acute urine retention with bladder over distention.  He had 1.1 L on I&O cath.  He has no history of BPH or urinary retention.  Foley catheter replaced on 11/12/2020.  ? ?Subjective: ?Seen and examined earlier this morning.  No major events overnight of this morning.  Did not have a good sleep due to dressing change after midnight.  Still with significant pain but expected.  Denies chest pain, dyspnea, palpitation or GI symptoms.  Patient's wife at bedside.  We have discussed about anticoagulation given his history of CVA, A-fib, PVCs.  He denies history of major bleeding.  Patient and wife would like to start Eliquis.  Patient has upcoming appointment with cardiologist mid-April. ? ?Objective: ?Vitals:  ? 11/12/21 2030 11/13/21 0058 11/13/21 0425 11/13/21 1016  ?BP: 117/60 (!) 110/59 126/64   ?Pulse: 68 73 72   ?Resp: 16 18 18    ?Temp: 98.5 ?F (36.9 ?C) 98.5 ?F (36.9 ?C) 97.8 ?F (36.6 ?C)   ?TempSrc: Oral Oral Oral   ?SpO2: 93% 92% 92% 91%  ?Weight:      ?Height:      ? ? ?Examination: ? ?GENERAL: No apparent distress.  Nontoxic. ?HEENT: MMM.  Vision and hearing grossly intact.  ?NECK: Supple.  No apparent JVD.  ?RESP:  No IWOB.  Fair aeration bilaterally. ?CVS:   RRR. Heart sounds normal.  ?ABD/GI/GU: BS+. Abd soft, NTND.  Indwelling Foley.  Clear urine in bag ?MSK/EXT:  Moves extremities. No apparent deformity. No edema.  ?SKIN: See picture under media for surgical wound ?NEURO: Awake and alert. Oriented appropriately.  No apparent focal neuro deficit. ?PSYCH: Calm. Normal affect.  ? ?Procedures:  ?3/28-I&D of left buttock abscess, and debridement of left gluteal fournier's gangrene ? ?Microbiology summarized: ?3/28-blood cultures NGTD ?3/28-deep tissue cultures pending.  Gram stain with few GPR and moderate GPC's ? 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 Dema Severin on 3/28.  Deep tissue cultures pending.  Gram stain with GPR and moderate GPC's. ?-On broad-spectrum antibiotics per primary. ? ?Acute urinary retention ?Patient had 1.1 L urine on I&O cath after Foley was discontinued the morning of 3/29.  No prior history of BPH or urinary retention.  Likely from surgery, opiates and anesthesia. ?-Continue bladder rest with Foley catheter at least for 1 week ?-Voiding trial outpatient ? ?CAD s/p DES to distal RCA and proximal LAD in 2021 ?No cardiopulmonary symptoms. ?-Continue home meds-Imdur, metoprolol and Lipitor  ?-Per his wife, patient has not taken Plavix in months.  ?-Patient has upcoming appointment with cardiologist on 4/21. ?-Can hold Plavix until follow-up with his cardiologist since we have started  Eliquis ? ?Paroxysmal A-fib and history of PSVT ?In sinus rhythm with PVCs.  CHA2DS2-VASc score at least 4.  Seems to be on amiodarone, Cardizem and bisoprolol.  He also carries history of WPW syndrome.  Per wife, patient has not taken his Eliquis in months.  After risk and benefit discussion, we have agreed to start Eliquis for anticoagulation.  ?-Continue home amiodarone, bisoprolol and Cardizem ?-Resumed Eliquis at 5 mg twice  daily after blessing from surgical team ?-Patient has upcoming appointment with cardiologist on 12/05/2021. ? ?Normocytic anemia ?Recent Labs  ?  12/31/20 ?0948 01/20/21 ?0853 02/12/21 ?0929 02/20/21 ?[id number] 03/10/21 ?2352 11/11/21 ?1233 11/12/21 ?[id number] 11/13/21 ?0402  ?HGB 14.5 14.6 14.8 11.2* 12.6* 13.7 12.0* 9.8*  ?Likely dilutional and possible surgical blood loss.  Anemia panel with vitamin B12 deficiency ?-IV fluids discontinued ?-Vitamin B12 injection 1000 mcg x 3 followed by p.o. ?-Recheck CBC in the morning ? ? ?Hyponatremia ?Slight drop in sodium.  Was on Bactrim prior to admission.  Was on IV fluid perioperatively ?-Recheck in the morning ?-Urine chemistry if worse ? ?History of CVA (cerebrovascular accident) ?Continue Lipitor. ?Resumed Eliquis ?Hold Plavix and aspirin until follow-up with cardiology ? ?COPD (chronic obstructive pulmonary disease) ([place]) ?Stable. ?-Continue home Dulera and Singulair. ?-Xopenex as needed given his arrhythmia history ? ?Diabetes mellitus type II, non insulin dependent ([place]) ?Uncontrolled.  A1c 8.4%.  On metformin at home. ?Recent Labs  ?Lab 11/12/21 ?1631 11/12/21 ?2022 11/13/21 ?0041 11/13/21 ?0401 11/13/21 ?[id number]  ?GLUCAP 171* 248* 155* 151* 142*  ?Continue SSI-moderate ?Continue Lipitor ? ? ?Vitamin B12 deficiency ?Vitamin B12 178.  He is on PPI which could contribute. ?-Vitamin B12 injection 1000 mcg daily for 3 days followed by p.o. ? ?Anxiety ?Continue home Xanax ? ?Tobacco use disorder ?Encourage tobacco cessation ?Continue nicotine patch ? ?Essential hypertension ?Cardiac meds as above ? ? ? ? ?DVT prophylaxis:  ?SCDs Start: 11/11/21 1458 ?apixaban (ELIQUIS) tablet 5 mg  ?Code Status: Full code ?Family Communication: Updated patient's wife at bedside. ?Level of care: Telemetry ?Status is: Inpatient ? ? ?Final disposition: Per primary ? ?Consultants:  ?We are ? ?Sch Meds:  ?Scheduled Meds: ? acetaminophen  1,000 mg Oral Q8H  ? amiodarone  200 mg Oral Daily  ? apixaban  5  mg Oral BID  ? atorvastatin  40 mg Oral q1800  ? bisoprolol  5 mg Oral Daily  ? Chlorhexidine Gluconate Cloth  6 each Topical Daily  ? cyanocobalamin  1,000 mcg Intramuscular Daily  ? Followed by  ? [START ON 11/16/2021] vitamin B-12  1,000 mcg Oral Daily  ? diltiazem  240 mg Oral Daily  ? docusate sodium  100 mg Oral BID  ? insulin aspart  0-15 Units Subcutaneous TID WC  ? insulin aspart  0-5 Units Subcutaneous QHS  ? isosorbide mononitrate  60 mg Oral Daily  ? methocarbamol  750 mg Oral Q6H  ? mometasone-formoterol  2 puff Inhalation BID  ? montelukast  10 mg Oral QHS  ? nicotine  21 mg Transdermal Daily  ? pantoprazole  40 mg Oral QHS  ? polyethylene glycol  17 g Oral Daily  ? sodium hypochlorite   Irrigation BID  ? tamsulosin  0.4 mg Oral QPC breakfast  ? ?Continuous Infusions: ? clindamycin (CLEOCIN) IV 900 mg (11/13/21 0732)  ? piperacillin-tazobactam (ZOSYN)  IV 3.375 g (11/13/21 [id number])  ? vancomycin 1,500 mg (11/13/21 0810)  ? ?PRN Meds:.alprazolam, diphenhydrAMINE **OR** diphenhydrAMINE, HYDROmorphone (DILAUDID) injection, levalbuterol, metoprolol tartrate, ondansetron **OR** ondansetron (ZOFRAN) IV,  oxyCODONE, simethicone ? ?Antimicrobials: ?Anti-infectives (From admission, onward)  ? ? Start     Dose/Rate Route Frequency Ordered Stop  ? 11/12/21 0800  vancomycin (VANCOREADY) IVPB 1500 mg/300 mL       ? 1,500 mg ?150 mL/hr over 120 Minutes Intravenous Every 12 hours 11/11/21 1939    ? 11/11/21 2200  clindamycin (CLEOCIN) IVPB 900 mg       ? 900 mg ?100 mL/hr over 30 Minutes Intravenous Every 8 hours 11/11/21 1923 11/14/21 2359  ? 11/11/21 2030  vancomycin (VANCOREADY) IVPB 2000 mg/400 mL       ? 2,000 mg ?200 mL/hr over 120 Minutes Intravenous  Once 11/11/21 1935 11/11/21 2308  ? 11/11/21 2000  piperacillin-tazobactam (ZOSYN) IVPB 3.375 g       ? 3.375 g ?12.5 mL/hr over 240 Minutes Intravenous Every 8 hours 11/11/21 1935    ? 11/11/21 1515  piperacillin-tazobactam (ZOSYN) IVPB 3.375 g  Status:  Discontinued        ? 3.375 g ?12.5 mL/hr over 240 Minutes Intravenous Every 8 hours 11/11/21 1507 11/11/21 1943  ? 11/11/21 1230  piperacillin-tazobactam (ZOSYN) IVPB 3.375 g       ? 3.375 g ?100 mL/hr over 30 Minutes

## 2021-11-13 NOTE — Progress Notes (Addendum)
[facility] ?Progress Note ? ?2 Days Post-Op  ?Subjective: ?CC-  ?Still having a lot of pain from surgery but less today than yesterday. ?WBC down 12.1, TMAX 99.3 ? ?Objective: ?Vital signs in last 24 hours: ?Temp:  [97.8 ?F (36.6 ?C)-99.3 ?F (37.4 ?C)] 97.8 ?F (36.6 ?C) (03/30 0425) ?Pulse Rate:  [68-87] 72 (03/30 0425) ?Resp:  [16-19] 18 (03/30 0425) ?BP: (110-139)/(59-85) 126/64 (03/30 0425) ?SpO2:  [91 %-93 %] 92 % (03/30 0425) ?Last BM Date : 11/12/21 ? ?Intake/Output from previous day: ?03/29 0701 - 03/30 0700 ?In: 3173 [P.O.:480; I.V.:1786.7; IV Piggyback:906.3] ?Out: 3025 [Urine:3025] ?Intake/Output this shift: ?No intake/output data recorded. ? ?PE: ?Gen:  Alert, NAD ?Left buttock: s/p I&D with 2 wounds connected by penrose drain, no purulent drainage noted, persistent induration noted ~6cm proximally and ~4cm distally but no fluctuance, some fibrinous tissue noted at base of both wounds ? ? ?Lab Results:  ?Recent Labs  ?  11/12/21 ?0334 11/13/21 ?0402  ?WBC 17.5* 12.1*  ?HGB 12.0* 9.8*  ?HCT 35.7* 29.2*  ?PLT 304 286  ? ? ?BMET ?Recent Labs  ?  11/12/21 ?0334 11/13/21 ?0402  ?NA 134* 129*  ?K 5.1 4.5  ?CL 99 98  ?CO2 28 25  ?GLUCOSE 176* 134*  ?BUN 14 10  ?CREATININE 1.01 0.81  ?CALCIUM 8.1* 7.7*  ? ? ?PT/INR ?No results for input(s): LABPROT, INR in the last 72 hours. ?CMP  ?   ?Component Value Date/Time  ? NA 129 (L) 11/13/2021 0402  ? K 4.5 11/13/2021 0402  ? CL 98 11/13/2021 0402  ? CO2 25 11/13/2021 0402  ? GLUCOSE 134 (H) 11/13/2021 0402  ? BUN 10 11/13/2021 0402  ? CREATININE 0.81 11/13/2021 0402  ? CREATININE 0.87 07/18/2015 1008  ? CALCIUM 7.7 (L) 11/13/2021 0402  ? PROT 6.8 03/10/2021 2352  ? ALBUMIN 2.6 (L) 11/13/2021 0402  ? AST 16 03/10/2021 2352  ? ALT 18 03/10/2021 2352  ? ALKPHOS 108 03/10/2021 2352  ? BILITOT 0.5 03/10/2021 2352  ? GFRNONAA >60 11/13/2021 0402  ? GFRAA >60 02/24/2020 0929  ? ?Lipase  ?   ?Component Value Date/Time  ? LIPASE 21 01/20/2021 0853   ? ? ? ? ? ?Studies/Results: ?CT PELVIS W CONTRAST ? ?Result Date: 11/11/2021 ?CLINICAL DATA:  Fever, perianal abscess EXAM: CT PELVIS WITH CONTRAST TECHNIQUE: Multidetector CT imaging of the pelvis was performed using the standard protocol following the bolus administration of intravenous contrast. RADIATION DOSE REDUCTION: This exam was performed according to the departmental dose-optimization program which includes automated exposure control, adjustment of the mA and/or kV according to patient size and/or use of iterative reconstruction technique. CONTRAST:  OMNIPAQUE IOHEXOL 300 MG/ML  SOLN COMPARISON:  CT done on 12/31/2020 FINDINGS: There is no free fluid in the pelvis. There is mild diffuse wall thickening in the sigmoid colon. Appendix is not dilated. There is marked inflammatory stranding in the left perianal region. There is 5.2 x 3.2 cm area of focal homogeneous density in the left perianal region. There is another area of low attenuation measuring approximally 4.4 x 2.1 cm in the medial left gluteal region. There are no pockets of air in the left perianal region. Evaluation of the urinary bladder is limited by beam hardening artifacts from orthopedic hardware. Degenerative changes are noted in the lower lumbar spine with facet hypertrophy. Bulging of annulus is noted at L5-S1 level causing extrinsic pressure over the ventral margin of thecal sac. There is encroachment of neural foramina at L4-L5  and L5-S1 levels. There is previous left hip arthroplasty. No recent fracture is seen. There is no dislocation in the hips. SI joints are unremarkable. There are slightly enlarged lymph nodes in the both inguinal regions, more so on the left side. Largest of the nodes measures approximately 16 x 14 mm. Degenerative changes are noted in the right hip. IMPRESSION: There is marked inflammatory stranding in the fat planes in the left perianal region suggesting cellulitis. There is 5.2 x 3.2 cm area of  homogeneous opacity in the left perianal region immediately posterior to the anus. There is another smaller collection measuring 4.4 x 2.1 cm. Findings suggest possible left perianal cellulitis and abscesses. There is no demonstrable perirectal fluid collection. There is diffuse wall thickening in the sigmoid colon which may be due to incomplete distention or suggest nonspecific colitis. Previous left hip arthroplasty. Degenerative changes are noted in the right hip. Electronically Signed   By: Palani  RathinaErnie Avenasamy M.D.   On: 11/11/2021 13:53   ? ?Anti-infectives: ?Anti-infectives (From admission, onward)  ? ? Start     Dose/Rate Route Frequency Ordered Stop  ? 11/12/21 0800  vancomycin (VANCOREADY) IVPB 1500 mg/300 mL       ? 1,500 mg ?150 mL/hr over 120 Minutes Intravenous Every 12 hours 11/11/21 1939    ? 11/11/21 2200  clindamycin (CLEOCIN) IVPB 900 mg       ? 900 mg ?100 mL/hr over 30 Minutes Intravenous Every 8 hours 11/11/21 1923    ? 11/11/21 2030  vancomycin (VANCOREADY) IVPB 2000 mg/400 mL       ? 2,000 mg ?200 mL/hr over 120 Minutes Intravenous  Once 11/11/21 1935 11/11/21 2308  ? 11/11/21 2000  piperacillin-tazobactam (ZOSYN) IVPB 3.375 g       ? 3.375 g ?12.5 mL/hr over 240 Minutes Intravenous Every 8 hours 11/11/21 1935    ? 11/11/21 1515  piperacillin-tazobactam (ZOSYN) IVPB 3.375 g  Status:  Discontinued       ? 3.375 g ?12.5 mL/hr over 240 Minutes Intravenous Every 8 hours 11/11/21 1507 11/11/21 1943  ? 11/11/21 1230  piperacillin-tazobactam (ZOSYN) IVPB 3.375 g       ? 3.375 g ?100 mL/hr over 30 Minutes Intravenous  Once 11/11/21 1220 11/11/21 1418  ? ?  ? ? ? ?Assessment/Plan ?Fournier's gangrene-left buttock ?-POD#2 s/p Incision and drainage of left gluteal abscess x2; Debridement of Fournier's gangrene of the left buttock-skin, subcutaneous fat, fascia of left gluteus maximus, totaling 10 x 10 x 10 cm of tissue; Anorectal exam under anesthesia 3/28 Dr. Cliffton AstersWhite ?- cultures pending, gram stain with  FEW GRAM POSITIVE RODS and MODERATE GRAM POSITIVE COCCI ?- continue broad spectrum antibiotics and monitor cultures ?- WBC trending down 12.1, afebrile ?- Wounds stable without the need for any further surgical debridement. Continue penrose drain. Continue BID wet to dry dressing changes with dakins. Pack both wounds with 4x4 gauze lightly moistened with dakins x3 days (today is #2/3). He will not need to pack wounds at home. ?- shower with wounds open ?- PT consult pending ?- schedule tylenol and robaxin for improved pain control. Wean dilaudid. Pain control somewhat difficult given chronic narcotic use ?- may be ready for discharge tomorrow ? ?ID - currently clinda/ zosyn/ vancomycin ?FEN - HH/CM diet. Miralax/colace ?VTE - lovenox - ok to start full anticoagulation today ?Foley - replaced 3/29 for urinary retention ? ?--per [facility]-- ?Hyponatremia ?Urinary retention - foley replaced 3/29 and started flomax. Plan to d/c home with foley and f/u  outpatient with urology ?CAD s/p stents on plavix - spoke with [facility], planning to dc plavix at discharge and start eliquis, follow up with cardiology to discuss need to continue plavix long term ?Hx CVA ?HTN ?DM - A1c 8.4 ?HLD ?COPD ?Afib (s/p now ablation) - pt stopped eliquis on his own ?GERD  ?Chronic pain - takes Percocet 10/325mg  BID at home ?Tobacco abuse ? ? LOS: 1 day  ? ? ?Eric Form, PA-C ?[facility] ?11/13/2021, 8:47 AM ?Please see Amion for pager number during day hours 7:00am-4:30pm ? ?

## 2021-11-13 NOTE — Assessment & Plan Note (Addendum)
Vitamin B12 178.  He is on PPI which could contribute. ?-Vitamin B12 injection 1000 mcg daily x2 in house.  Discharged on p.o. vitamin B12. ?

## 2021-11-13 NOTE — Assessment & Plan Note (Addendum)
Recent Labs  ?  12/31/20 ?0948 01/20/21 ?0853 02/12/21 ?0929 02/20/21 ?6606 03/10/21 ?2352 11/11/21 ?1233 11/12/21 ?3016 11/13/21 ?0402 11/14/21 ?0404  ?HGB 14.5 14.6 14.8 11.2* 12.6* 13.7 12.0* 9.8* 10.4*  ?Likely dilutional and possible surgical blood loss.  Stable.  Anemia panel with vitamin B12 deficiency ?-Vitamin B12 injection 1000 mcg x x2.  Continue p.o. vitamin B12 ?-Recheck CBC in 1 to 2 weeks. ? ?

## 2021-11-13 NOTE — Discharge Instructions (Addendum)
You do not have to pack your wound ?Keep flexible penrose drain in place - this will be removed at your postoperative appointment ?Apply dry gauze over top and secure with tape or underwear to collect any drainage ?Shower with wounds open ?Take antibiotics as prescribed ?Follow up with surgery as scheduled ?Monitor for any increased pain/ redness/ drainage from surgery site or fevers/ chills, call with concerns ? ? ? ? ?Tips For Adding Protein Nutrition Therapy  ?Patients may be advised to increase the protein in their diet but not necessarily the calories as well. However, note that when adding protein to your diet, you will also be adding extra calories. The following suggestions may help add the extra protein while keeping the calories as low as possible. ? ?Tips ?Add extra egg to one or more meals  ?Increase the portion of milk to drink and change to skim milk if able  ?Include [place] yogurt or cottage cheese for snack or part of a meal  ?Increase portion size of protein entr?e and decrease portion of starch/bread  ?Mix protein powder, nut butter, almond/nut milk, non-fat dry milk, or Greek yogurt to shakes and smoothies  ?Use these ingredients also in baked goods or other recipes ?Use double the amount of sandwich filling  ?Add protein foods to all snacks including cheese, nut butters, milk and yogurt ?Food Tips for Including Protein  ?Beans Cook and use dried peas, beans, and tofu in soups or add to casseroles, pastas, and grain dishes that also contain cheese or meat  ?Mash with cheese and milk  ?Use tofu to make smoothies  ?Commercial Protein Supplements Use nutritional supplements or protein powder sold at pharmacies and grocery stores  ?Use protein powder in milk drinks and desserts, such as pudding  ?Mix with ice cream, milk, and fruit or other flavorings for a high-protein milkshake  ?Cottage Cheese or [employer] Mix with or use to stuff fruits and vegetables  ?Add to casseroles, spaghetti, noodles,  or egg dishes such as omelets, scrambled eggs, and souffl?s  ?Use gelatin, pudding-type desserts, cheesecake, and pancake or waffle batter  ?Use to stuff crepes, pasta shells, or manicotti  ?Puree and use as a substitute for sour cream  ?Eggs, Egg whites, and Egg Yolks Add chopped, hard-cooked eggs to salads and dressings, vegetables, casseroles, and creamed meats  ?Beat eggs into mashed potatoes, vegetable purees, and sauces  ?Add extra egg whites to quiches, scrambled eggs, custards, puddings, pancake batter, or [place] toast wash/batter  ?Make a rich custard with egg yolks, double strength milk, and sugar  ?Add extra hard-cooked yolks to deviled egg filling and sandwich spreads  ?Hard or Semi-Soft Cheese (Cheddar, Sherley Bounds) Melt on sandwiches, bread, muffins, tortillas, hamburgers, hot dogs, other meats or fish, vegetables, eggs, or desserts such as stewed figs or pies  ?Grate and add to soups, sauces, casseroles, vegetable dishes, potatoes, rice noodles, or meatloaf  ?Serve as a snack with crackers or bagels  ?Ice cream, Yogurt, and Frozen Yogurt Add to milk drinks such as milkshakes  ?Add to cereals, fruits, gelatin desserts, and pies  ?Blend or whip with soft or cooked fruits  ?Sandwich ice cream or frozen yogurt between enriched cake slices, cookies, or graham crackers  ?Use seasoned yogurt as a dip for fruits, vegetables, or chips  ?Use yogurt in place of sour cream in casseroles  ?Meat and Fish Add chopped, cooked meat or fish to vegetables, salads, casseroles, soups, sauces, and biscuit dough  ?Use in omelets, souffl?s, quiches,  and sandwich fillings  ?Add chicken and [place] to stuffing  ?Wrap in pie crust or biscuit dough as turnovers  ?Add to stuffed baked potatoes  ?Add pureed meat to soups  ?Milk Use in beverages and in cooking  ?Use in preparing foods, such as hot cereal, soups, cocoa, or pudding  ?Add cream sauces to vegetable and other dishes  ?Use evaporated milk, evaporated skim milk, or  sweetened condensed milk instead of milk or water in recipes.  ?Nonfat Dry Milk Add 1/3 cup of nonfat dry milk powdered milk to each cup of regular milk for ?double strength? milk  ?Add to yogurt and milk drinks, such as pasteurized eggnog and milkshakes  ?Add to scrambled eggs and mashed potatoes  ?Use in casseroles, meatloaf, hot cereal, breads, muffins, sauces, cream soups, puddings and custards, and other milk-based desserts  ?Nuts, Seeds, and Wheat Germ Add to casseroles, breads, muffins, pancakes, cookies, and waffles  ?Sprinkle on fruit, cereal, ice cream, yogurt, vegetables, salads, and toast as a crunchy topping  ?Use in place of breadcrumbs  ?Blend with parsley or spinach, herbs, and cream for a noodle, pasta, or vegetable sauce.  ?Roll banana in chopped nuts  ?Peanut Butter Spread on sandwiches, toast, muffins, crackers, waffles, pancakes, and fruit slices  ?Use as a dip for raw vegetables, such as carrots, cauliflower, and celery  ?Blend with milk drinks, smoothies, and other beverages  ?Swirl through soft ice cream or yogurt  ?Spread on a banana then roll in crushed, dry cereal or chopped nuts  ? ?Copyright 2020 ? Academy of [employer]etics. All rights reserved  ? ? ?

## 2021-11-14 DIAGNOSIS — E871 Hypo-osmolality and hyponatremia: Secondary | ICD-10-CM

## 2021-11-14 DIAGNOSIS — L03317 Cellulitis of buttock: Secondary | ICD-10-CM

## 2021-11-14 DIAGNOSIS — L0231 Cutaneous abscess of buttock: Secondary | ICD-10-CM

## 2021-11-14 DIAGNOSIS — E538 Deficiency of other specified B group vitamins: Secondary | ICD-10-CM

## 2021-11-14 DIAGNOSIS — D649 Anemia, unspecified: Secondary | ICD-10-CM

## 2021-11-14 LAB — GLUCOSE, CAPILLARY
Glucose-Capillary: 164 mg/dL — ABNORMAL HIGH (ref 70–99)
Glucose-Capillary: 167 mg/dL — ABNORMAL HIGH (ref 70–99)
Glucose-Capillary: 243 mg/dL — ABNORMAL HIGH (ref 70–99)
Glucose-Capillary: 268 mg/dL — ABNORMAL HIGH (ref 70–99)

## 2021-11-14 LAB — RENAL FUNCTION PANEL
Albumin: 2.7 g/dL — ABNORMAL LOW (ref 3.5–5.0)
Anion gap: 6 (ref 5–15)
BUN: 9 mg/dL (ref 6–20)
CO2: 26 mmol/L (ref 22–32)
Calcium: 8.4 mg/dL — ABNORMAL LOW (ref 8.9–10.3)
Chloride: 99 mmol/L (ref 98–111)
Creatinine, Ser: 0.76 mg/dL (ref 0.61–1.24)
GFR, Estimated: >60 mL/min (ref >60)
Glucose, Bld: 162 mg/dL — ABNORMAL HIGH (ref 70–99)
Phosphorus: 5.4 mg/dL — ABNORMAL HIGH (ref 2.5–4.6)
Potassium: 4.7 mmol/L (ref 3.5–5.1)
Sodium: 131 mmol/L — ABNORMAL LOW (ref 135–145)

## 2021-11-14 LAB — CBC
HCT: 30.9 % — ABNORMAL LOW (ref 39.0–52.0)
Hemoglobin: 10.4 g/dL — ABNORMAL LOW (ref 13.0–17.0)
MCH: 31.2 pg (ref 26.0–34.0)
MCHC: 33.7 g/dL (ref 30.0–36.0)
MCV: 92.8 fL (ref 80.0–100.0)
Platelets: 333 10*3/uL (ref 150–400)
RBC: 3.33 MIL/uL — ABNORMAL LOW (ref 4.22–5.81)
RDW: 12.6 % (ref 11.5–15.5)
WBC: 10.1 10*3/uL (ref 4.0–10.5)
nRBC: 0 % (ref 0.0–0.2)

## 2021-11-14 LAB — MAGNESIUM: Magnesium: 1.8 mg/dL (ref 1.7–2.4)

## 2021-11-14 LAB — VITAMIN D 25 HYDROXY (VIT D DEFICIENCY, FRACTURES): Vit D, 25-Hydroxy: 35.43 ng/mL (ref 30–100)

## 2021-11-14 MED ORDER — AMOXICILLIN-POT CLAVULANATE 875-125 MG PO TABS: 1.0000 | ORAL_TABLET | Freq: Two times a day (BID) | ORAL | 0 refills | Status: AC

## 2021-11-14 MED ORDER — APIXABAN 5 MG PO TABS: 5.0000 mg | ORAL_TABLET | Freq: Two times a day (BID) | ORAL | 1 refills | Status: AC

## 2021-11-14 MED ORDER — INSULIN GLARGINE-YFGN 100 UNIT/ML ~~LOC~~ SOLN
7.0000 [IU] | Freq: Every day | SUBCUTANEOUS | Status: DC
Start: 2021-11-14 — End: 2021-11-14
  Administered 2021-11-14: 7 [IU] via SUBCUTANEOUS
  Filled 2021-11-14: qty 0.07

## 2021-11-14 MED ORDER — TAMSULOSIN HCL 0.4 MG PO CAPS
0.4000 mg | ORAL_CAPSULE | Freq: Every day | ORAL | 0 refills | Status: AC
Start: 2021-11-15 — End: ?

## 2021-11-14 MED ORDER — ACETAMINOPHEN 500 MG PO TABS: 1000.0000 mg | ORAL_TABLET | Freq: Three times a day (TID) | ORAL | 0 refills | Status: AC | PRN

## 2021-11-14 MED ORDER — OXYCODONE HCL 10 MG PO TABS: 5.0000 mg | ORAL_TABLET | Freq: Four times a day (QID) | ORAL | 0 refills | Status: AC | PRN

## 2021-11-14 NOTE — Discharge Summary (Signed)
[name] ?Discharge Summary  ? ?Patient ID: ?Dean [name] ?MRN: [id number] ?DOB/AGE: [birth year] 49 y.o. ? ?Admit date: 11/11/2021 ?Discharge date: 11/14/2021 ? ?Admitting Diagnosis: ?Fournier's gangrene-left buttock ? ?Discharge Diagnosis ?Patient Active Problem List  ? Diagnosis Date Noted  ? Hyponatremia 11/13/2021  ? Normocytic anemia 11/13/2021  ? Vitamin B12 deficiency 11/13/2021  ? 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 Acute urinary retention 11/12/2021  ? Anxiety 11/12/2021  ? Perianal abscess 11/11/2021  ? CAD s/p DES to distal RCA and proximal LAD in 2021 11/11/2021  ? History of CVA (cerebrovascular accident) 11/11/2021  ? Necrotizing fasciitis ([place]) 11/11/2021  ? Status post total replacement of left hip 02/19/2021  ? NSTEMI (non-ST elevated myocardial infarction) ([place]) 11/14/2019  ? Stroke-like symptoms 05/18/2016  ? Tobacco use disorder 05/18/2016  ? COPD (chronic obstructive pulmonary disease) ([place]) 05/18/2016  ? ETOH abuse 05/18/2016  ? Pulmonary nodule, right 05/18/2016  ? CAD-noted on chest CT 05/18/2016  ? Family history of coronary artery disease in father 05/18/2016  ? Cerebrovascular accident (CVA) due to thrombosis of right middle cerebral artery ([place])   ? Mixed hyperlipidemia   ? Long term current use of anticoagulant 08/16/2015  ? Gastric catarrh 08/16/2015  ? History of biliary T-tube placement 08/16/2015  ? Personal history of other diseases of the circulatory system 08/16/2015  ? Polycythemia, secondary 08/16/2015  ? Paroxysmal A-fib and history of PSVT 07/18/2015  ? Morbid obesity ([place]) 07/18/2015  ? Essential hypertension 07/18/2015  ? Diabetes mellitus type II, non insulin dependent ([place]) 05/10/2014  ? History of PSVT (paroxysmal supraventricular tachycardia) 05/09/2014  ? WPW (Wolff-Parkinson-White syndrome) 05/09/2014  ? Avascular necrosis of bone of hip ([place]) 07/12/2013  ? Acid reflux 07/12/2013  ? Arthralgia of hip 07/12/2013  ?  Chest pain of unknown etiology 05/01/2013  ? ? ?Consultants ?Hospitalist ? ?Imaging: ?No results found. ? ?Procedures ?Dr. Dema Severin (11/11/2021) -  ?Incision and drainage of left gluteal abscess x2 ?Debridement of Fournier's gangrene of the left buttock-skin, subcutaneous fat, fascia of left gluteus maximus, totaling 10 x 10 x 10 cm of tissue ?Anorectal exam under anesthesia ? ?Hospital Course:  ?Dean [name] is a 51yo male with hx of CAD (s/p cardiac clearance 01/2021 for hip surgery now just on plavix), HTN, DM, HLD, afib (s/p now ablation), GERD who presented to [facility]/28 with ongoing issues with perirectal abscess.  Painful nodule in left buttock beginning 1 wk ago, reports had I&D in [place] 4 days ago and a 'drain placed.' This was removed at pcp yesterday. Notes swelling has progressed over the last 5-6 days, never felt like anything got better after his attempted drainage procedure. +Fevers to 103F at home despite bactrim. CT shows abscess in left perirectal region with associated induration and cellulitis; abscess 5 x 3 cm; separate abscess 4 x 2 cm. Patient was transferred to [facility] for surgery and admission. ?He was started on broad spectrum antibiotics and taken to surgery for the procedure listed above.  Tolerated procedure well and was transferred to the floor.  Wounds were packed with three days of dakins. Penrose left in place and the wounds do not need to be repacked after discharge. Intraoperative cultures grew Enterococcus faecalis but were pending for anaerobes at time of discharge. He will be discharged home on 3 days of augmentin.  ?Patient did develop urinary retention during hospitalization. No prior history of BPH or urinary retention.  Likely from surgery, opiates and anesthesia. He  was started on flomax and discharged home with foley catheter and urology follow up. ?Hospitalist team was consulted for assistance of medical management of his multiple chronic comorbidities. Given history of  paroxysmal atrial fibrillation, CAD, and h/o CVA it was ultimately decided to restart the patient on eliquis, and hold plavix until follow up discussion with cardiologist. ?On POD3 the patient was felt stable for discharge home.  Patient will follow up as below and knows to call with questions or concerns.   ? ?I have personally reviewed the patients medication history on the [place] controlled substance database.  ? ? ? ?Physical Exam: ?Gen:  Alert, NAD ?Left buttock: s/p I&D with 2 wounds connected by penrose drain, no purulent drainage noted, persistent induration noted ~5cm proximally and ~4cm distally but no fluctuance, some fibrinous tissue noted at base of both wounds ? ? ? ?Allergies as of 11/14/2021   ? ?   Reactions  ? Prednisone Other (See Comments)  ? "goes crazy"  ? ?  ? ?  ?Medication List  ?  ? ?STOP taking these medications   ? ?aspirin EC 81 MG tablet ?  ?cephALEXin 500 MG capsule ?Commonly known as: KEFLEX ?  ?clopidogrel 75 MG tablet ?Commonly known as: PLAVIX ?  ?methocarbamol 750 MG tablet ?Commonly known as: ROBAXIN ?  ?oxyCODONE-acetaminophen 10-325 MG tablet ?Commonly known as: Percocet ?  ?sulfamethoxazole-trimethoprim 800-160 MG tablet ?Commonly known as: BACTRIM DS ?  ? ?  ? ?TAKE these medications   ? ?acetaminophen 500 MG tablet ?Commonly known as: TYLENOL ?Take 2 tablets (1,000 mg total) by mouth every 8 (eight) hours as needed for mild pain. ?  ?alprazolam 2 MG tablet ?Commonly known as: Duanne Moron ?Take 2 mg by mouth 3 (three) times daily. ?  ?amiodarone 200 MG tablet ?Commonly known as: PACERONE ?TAKE 1 TABLET ONCE DAILY. ?  ?amoxicillin-clavulanate 875-125 MG tablet ?Commonly known as: Augmentin ?Take 1 tablet by mouth 2 (two) times daily for 3 days. ?  ?apixaban 5 MG Tabs tablet ?Commonly known as: ELIQUIS ?Take 1 tablet (5 mg total) by mouth 2 (two) times daily. ?  ?atorvastatin 80 MG tablet ?Commonly known as: LIPITOR ?Take 1 tablet (80 mg total) by mouth daily at 6 PM. ?  ?bisoprolol 5 MG  tablet ?Commonly known as: ZEBETA ?TAKE 1 TABLET ONCE DAILY. ?  ?budesonide-formoterol 160-4.5 MCG/ACT inhaler ?Commonly known as: SYMBICORT ?Inhale 2 puffs into the lungs 2 (two) times daily as needed (shortness of breath). ?  ?cetirizine 10 MG tablet ?Commonly known as: ZYRTEC ?Take 10 mg by mouth daily. ?  ?diltiazem 240 MG 24 hr capsule ?Commonly known as: CARDIZEM CD ?Take 1 capsule (240 mg total) by mouth daily. ?  ?docusate sodium 100 MG capsule ?Commonly known as: Colace ?Take 1 capsule (100 mg total) by mouth daily as needed. ?  ?isosorbide mononitrate 60 MG 24 hr tablet ?Commonly known as: IMDUR ?Take 1 tablet (60 mg total) by mouth daily. ?  ?lactulose 10 GM/15ML solution ?Commonly known as: [place] ?Take 15 mLs by mouth 2 (two) times daily as needed for moderate constipation. ?  ?levalbuterol 0.63 MG/3ML nebulizer solution ?Commonly known as: XOPENEX ?Take 0.63 mg by nebulization every 6 (six) hours as needed for shortness of breath or wheezing. ?  ?levalbuterol 45 MCG/ACT inhaler ?Commonly known as: XOPENEX HFA ?Inhale 2 puffs into the lungs every 6 (six) hours as needed for wheezing or shortness of breath. ?  ?metFORMIN 1000 MG tablet ?Commonly known as: GLUCOPHAGE ?Take 1,000 mg  by mouth daily. ?  ?montelukast 10 MG tablet ?Commonly known as: SINGULAIR ?Take 10 mg by mouth every morning. ?  ?nitroGLYCERIN 0.4 MG SL tablet ?Commonly known as: NITROSTAT ?Place 1 tablet (0.4 mg total) under the tongue every 5 (five) minutes x 3 doses as needed for chest pain. ?  ?Oxycodone HCl 10 MG Tabs ?Take 0.5-1 tablets (5-10 mg total) by mouth every 6 (six) hours as needed for moderate pain or severe pain. ?  ?pantoprazole 40 MG tablet ?Commonly known as: PROTONIX ?Take 40 mg by mouth daily. ?  ?sucralfate 1 g tablet ?Commonly known as: Carafate ?Take 1 tablet (1 g total) by mouth 4 (four) times daily -  with meals and at bedtime. ?What changed: when to take this ?  ?tamsulosin 0.4 MG Caps capsule ?Commonly known  as: FLOMAX ?Take 1 capsule (0.4 mg total) by mouth daily after breakfast. ?Start taking on: November 15, 2021 ?  ? ?  ? ?  ?  ? ? ?  ?Durable Medical Equipment  ?(From admission, onward)  ?  ? ? ?  ? ?  Star

## 2021-11-14 NOTE — Assessment & Plan Note (Signed)
Noted that patient is also on nodal blocking agents. ?-Defer to his cardiologist ?

## 2021-11-14 NOTE — Final Progress Note (Signed)
?PROGRESS NOTE ? ?Dean [name] [id number] DOB: 1970/09/15  ? ?PCP: Jaynee Eagles, MD ? ?Patient is from: Home.  Lives with his wife. ? ?DOA: 11/11/2021 LOS: 2 ? ?Chief complaints ?Chief Complaint  ?Patient presents with  ? Rectal Pain  ?  ? 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  He was a started on vancomycin, Zosyn and clindamycin.   ? ?Hospitalist service consulted for help with medical management given his chronic comorbidities. ? ?Postoperatively, patient has acute urine retention with bladder over distention.  He had 1.1 L on I&O cath.  He has no history of BPH or urinary retention.  Foley catheter replaced on 11/12/2020.  Discharged with Foley catheter at least for 1 week.  Outpatient follow-up with urology for voiding trial scheduled on 11/25/2021. ? ?In regards to cardiac issue, patient was started on Eliquis for A-fib given his significant risk for CVA after risk and benefit discussion.  He has upcoming appointment with his cardiologist later this month.  Aspirin and Plavix discontinued as he has not taken those medications in months. ? ?Regards to Fournier gangrene/abscess, deep tissue culture grew Enterococcus faecalis.  Blood cultures negative.  Antibiotic de-escalated to p.o. Augmentin by primary team.  ? ?Subjective: ?Seen and examined earlier this morning.  No major events overnight of this morning.  Pain fairly controlled.  He is eager to go home.  Patient's wife at bedside. ? ?Objective: ?Vitals:  ? 11/13/21 2028 11/14/21 0019 11/14/21 0421 11/14/21 0900  ?BP:  (!) 107/55 128/64   ?Pulse:  62 66   ?Resp:  18 18   ?Temp:  97.8 ?F (36.6 ?C) 97.7 ?F (36.5 ?C)   ?TempSrc:  Oral Oral   ?SpO2: 94% 90% 96% 96%   ?Weight:      ?Height:      ? ? ?Examination: ? ?GENERAL: No apparent distress.  Nontoxic. ?HEENT: MMM.  Vision and hearing grossly intact.  ?NECK: Supple.  No apparent JVD.  ?RESP:  No IWOB.  Fair aeration bilaterally. ?CVS:  RRR. Heart sounds normal.  ?ABD/GI/GU: BS+. Abd soft, NTND.  Indwelling Foley.  Clear urine in bag. ?MSK/EXT:  Moves extremities. No apparent deformity. No edema.  ?SKIN: See picture under media for surgical wound. ?NEURO: Awake and alert. Oriented appropriately.  No apparent focal neuro deficit. ?PSYCH: Calm. Normal affect.  ? ?Procedures:  ?3/28-I&D of left buttock abscess, and debridement of left gluteal fournier's gangrene ? ?Microbiology summarized: ?3/28-blood cultures NGTD ?3/28-deep tissue cultures pending.  Gram stain with few GPR and moderate GPC's ? 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 Dema Severin on 3/28.  Deep tissue cultures pending.  Gram stain with GPR and moderate GPC's.  Cultures with Enterococcus faecalis.  Blood culture NGTD. ?-Received vancomycin, Zosyn and clindamycin in house ?-Antibiotics de-escalated to p.o. Augmentin by primary team. ?-Surgical wound care per primary team. ? ?Acute urinary retention ?Patient had 1.1 L urine on I&O cath after Foley was discontinued the morning of 3/29.  No prior history of BPH or urinary retention.  Likely from surgery, opiates and anesthesia. ?-Continue bladder rest with  Foley catheter at least for 1 week ?-Voiding trial outpatient ? ?CAD s/p DES to distal RCA and proximal LAD in 2021 ?No cardiopulmonary symptoms. ?-Continue home meds-Imdur, metoprolol and Lipitor  ?-Per his wife, patient has not taken Plavix or aspirin in months.  ?-Patient has upcoming appointment with cardiologist on 4/21. ?-Can hold Plavix until follow-up with his cardiologist since we have started Eliquis ? ?Paroxysmal  A-fib and history of PSVT ?In sinus rhythm with PVCs.  CHA2DS2-VASc score at least 4.  Seems to be on amiodarone, Cardizem and bisoprolol.  He also carries history of WPW syndrome.  Per wife, patient has not taken his Eliquis in months.  After risk and benefit discussion, we have agreed to start Eliquis for anticoagulation.  ?-Continue home amiodarone, bisoprolol and Cardizem ?-Resumed Eliquis at 5 mg twice daily after blessing from surgical team ?-Patient has upcoming appointment with cardiologist on 12/05/2021. ? ?Normocytic anemia ?Recent Labs  ?  12/31/20 ?0948 01/20/21 ?0853 02/12/21 ?0929 02/20/21 ?[id number] 03/10/21 ?2352 11/11/21 ?1233 11/12/21 ?[id number] 11/13/21 ?0402 11/14/21 ?0404  ?HGB 14.5 14.6 14.8 11.2* 12.6* 13.7 12.0* 9.8* 10.4*  ?Likely dilutional and possible surgical blood loss.  Stable.  Anemia panel with vitamin B12 deficiency ?-Vitamin B12 injection 1000 mcg x x2.  Continue p.o. vitamin B12 ?-Recheck CBC in 1 to 2 weeks. ? ? ?Hyponatremia ?Slight drop in sodium.  Was on Bactrim prior to admission.  Was on IV fluid perioperatively.  Improved. ?-Recheck at follow-up ? ?History of CVA (cerebrovascular accident) ?Continue Lipitor. ?Started Eliquis ? ?COPD (chronic obstructive pulmonary disease) ([place]) ?Continue home meds ? ?Diabetes mellitus type II, non insulin dependent ([place]) ?Continue home metformin. ?He could benefit from GLP-1 agonist with cardiovascular benefit. ?Continue home statin ? ? ?Vitamin B12 deficiency ?Vitamin B12 178.  He is on PPI which could contribute. ?-Vitamin B12 injection 1000 mcg daily x2 in house.  Discharged on p.o. vitamin B12. ? ?Anxiety ?Continue home Xanax ? ?Tobacco use disorder ?Encourage tobacco cessation ?Continue nicotine patch ? ?Essential hypertension ?Cardiac meds as above ? ?WPW (Wolff-Parkinson-White syndrome) ?Noted that patient is also on nodal blocking agents. ?-Defer to his cardiologist ? ? ? ? ?DVT prophylaxis:  ?SCDs Start: 11/11/21 1458 ?apixaban (ELIQUIS)  tablet 5 mg  ?Code Status: Full code ?Family Communication: Updated patient's wife at bedside. ?Level of care: Telemetry ?Status is: Inpatient ? ? ?Final disposition: Likely home today per primary ? ? ?Sch Meds:  ?Scheduled Meds: ? acetaminophen  1,000 mg Oral Q8H  ? amiodarone  200 mg Oral Daily  ? apixaban  5 mg Oral BID  ? atorvastatin  40 mg Oral q1800  ? bisoprolol  5 mg Oral Daily  ? Chlorhexidine Gluconate Cloth  6 each Topical Daily  ? cyanocobalamin  1,000 mcg Intramuscular Daily  ? Followed by  ? [START ON 11/16/2021] vitamin B-12  1,000 mcg Oral Daily  ? diltiazem  240 mg Oral Daily  ? docusate sodium  100 mg Oral BID  ? feeding supplement  237 mL Oral BID BM  ? insulin aspart  0-15 Units Subcutaneous TID WC  ? insulin aspart  0-5 Units Subcutaneous QHS  ? insulin glargine-yfgn  7 Units Subcutaneous Daily  ? isosorbide mononitrate  60 mg Oral Daily  ? methocarbamol  750 mg Oral Q6H  ? mometasone-formoterol  2 puff Inhalation BID  ? montelukast  10 mg Oral QHS  ? multivitamin with minerals  1 tablet Oral Daily  ? nicotine  21 mg Transdermal Daily  ? pantoprazole  40 mg Oral QHS  ? polyethylene glycol  17 g Oral Daily  ? Ensure Max Protein  11 oz Oral Daily  ? sodium hypochlorite   Irrigation BID  ? tamsulosin  0.4 mg Oral QPC breakfast  ? ?Continuous Infusions: ? clindamycin (CLEOCIN) IV 900 mg (11/14/21 0732)  ? piperacillin-tazobactam (ZOSYN)  IV 3.375 g (11/14/21 0603)  ? vancomycin 1,500 mg (11/14/21 0819)  ? ?PRN Meds:.alprazolam, diphenhydrAMINE **OR** diphenhydrAMINE, HYDROmorphone (DILAUDID) injection, levalbuterol, metoprolol tartrate, ondansetron **OR** ondansetron (ZOFRAN) IV, oxyCODONE, simethicone ? ?Antimicrobials: ?Anti-infectives (From admission, onward)  ? ? Start     Dose/Rate Route Frequency Ordered Stop  ? 11/14/21 0000  amoxicillin-clavulanate (AUGMENTIN) 875-125 MG tablet       ? 1 tablet Oral 2 times daily 11/14/21 0840 11/17/21 2359  ? 11/12/21 0800  vancomycin (VANCOREADY) IVPB 1500  mg/300 mL       ? 1,500 mg ?150 mL/hr over 120 Minutes Intravenous Every 12 hours 11/11/21 1939    ? 11/11/21 2200  clindamycin (CLEOCIN) IVPB 900 mg       ? 900 mg ?100 mL/hr over 30 Minutes Intravenous Ever

## 2021-11-16 LAB — AEROBIC/ANAEROBIC CULTURE W GRAM STAIN (SURGICAL/DEEP WOUND): Gram Stain: NONE SEEN

## 2021-11-16 LAB — CULTURE, BLOOD (ROUTINE X 2)
Culture: NO GROWTH
Culture: NO GROWTH
Special Requests: ADEQUATE
Special Requests: ADEQUATE

## 2021-11-18 LAB — VITAMIN A: Vitamin A (Retinoic Acid): 16.4 ug/dL — ABNORMAL LOW (ref 20.1–62.0)

## 2021-11-20 LAB — VITAMIN C: Vitamin C: 0.5 mg/dL (ref 0.4–2.0)

## 2021-12-03 NOTE — Progress Notes (Deleted)
Cardiology Office Note Date:  12/03/2021  Patient ID:  Dean [name], DOB 10-31-1970, MRN [id number] PCP:  Veverly Fells, MD  Electrophysiologist: Dr. Ladona Ridgel    Chief Complaint: *** wants to discuss meds  History of Present Illness: Dean [name] is a 51 y.o. 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  More recently, patient presented to the hospital on 11/14/2019 with chest pain started 4 days prior to arrival.  High-sensitivity troponin on arrival was 114.  Cardiac catheterization performed on 11/15/2019 showed 40% proximal RCA lesion, 99% distal RCA lesion treated with DES, 80% proximal LAD lesion treated with DES, EF 50 to 55%.  Postprocedure, he was placed on aspirin and Plavix in anticipation of stopping the aspirin after a month since the patient is also on Eliquis.  Echocardiogram obtained on 11/15/2019 showed EF 45 to 50%, mild hypokinesis of the left ventricular, basal mid anteroseptal wall and anterior segment.  Due to ongoing pain post-cath, he was continued on IV heparin until discharge and was placed on Imdur. He had cardiology follow up in April 2021 with recommendations to f/u in 2-3 mo, though has not.   12/31/20 [facility] ER with abd pain, nausea/vomiting, was wheezing, Rx pain med and was COVID +  01/10/21 AT [facility] ER with hip pain, Rx med  He was in [facility] ER 01/20/21 with abd pain, nausea, was weaning off his narcotic to gabapentin, reported inability to walk with severe hip pain and requesting pain relief. Rx med deferred to PCP  He last saw Dr. Ladona Ridgel April 2018 Mentioned hx of several prior ablation with persistent WPW and historically with increasing AF burden started on amiodarone and doing better.  EKG that day had no pre-excitation.  I saw him  June 2022 He reports his frustration of having to come to the appointment for this that all these doctors visit are going to run him dry financially. 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  Says he would remember that pain if he ever had it again.  He reports good exertional capacity and no SOB, DOE, no near syncope or syncope, no dizzy spells. About 12 weeks ago his hips got so bad that he has not been as physically active, some days can barely get around he is in so much pain. He still get to the job sites. He hates being on so many medicines but does take them He is no longer on Eliquis and both he (and his wife via telephone) report that a doctor told him to that all he needed to stay on was the Plavix.  He also adds that he does not want to go back on it. He was felt an acceptable risk for planned surgery Discussed restarting OAC and rational for it, (and his wife on the phone), declined  Hospitalized 11/11/21 - 11/14/21 w/ Fournier's gangrene-left buttock, underwent I&D, broad spectrum antibiotics, I do not appreciate any cardiac issues/concerns during his stay, though looks like d/w him there he was  agreeable to stopping plavix and starting Eliquis  *** needs amio labs *** symptoms, tachy/CAD *** eliquis, bleeding, dose    AF/SVT/WPW Hx REMOTELY was followed at [facility] since he was 51 years old.   He reported having had a total of 5 or 6 ablations since that time.   The first being around the age of 59 for what he remembers is WPW. He is unsure of the rhythms that were ablated subsequent to that.   Historically reported being on propafenone and flecainide both failed He was on Multaq in 04/2014 when he developed tachy-palpitations, chest tightness and  shortness of breath.  Tele noted atrial fibrillation with RVR, ventricular rates up to 200 as well as a short RP tachycardia with a cycle length of about 240msec.    Dec 2016 recurrent rapid AFib started on a/c, Multaq stopped and started on amiodarone   Past Medical History:  Diagnosis Date   Anxiety    Asthma    Atrial arrhythmia    multiples with 5-6 ablations   Atrial fibrillation with rapid ventricular response (HCC)    With rates up to 200 as well as short PR tachycardia, cycle length of 240 ms.   Chest tightness 05/08/2014   Diabetes mellitus, type II ([place])    Dysrhythmia    GERD (gastroesophageal reflux disease)    Morbid obesity ([place])    NSTEMI (non-ST elevated myocardial infarction) ([place]) 2021   Palpitations 05/08/2014   Pancreatitis    Pneumonia ~ 2010   Snores    Wil occasional apnea. Had a remote sleep study but does not remember results.   SOB (shortness of breath) 05/08/2014   SVT (supraventricular tachycardia) (HCC)    WPW (Wolff-Parkinson-White syndrome)     Past Surgical History:  Procedure Laterality Date   ATRIAL FIBRILLATION ABLATION     "& WPW"   CARDIAC CATHETERIZATION     "6 or 7" (05/18/2016)   CORONARY STENT INTERVENTION N/A 11/15/2019   Procedure: CORONARY STENT INTERVENTION;  Surgeon: Burnell Blanks, MD;  Location: [facility];  Service: Cardiovascular;  Laterality: N/A;   FRACTURE SURGERY     INCISION AND DRAINAGE ABSCESS Left 11/11/2021   Procedure: GLUTEAL INCISION AND DRAINAGE ABSCESS;  Surgeon: Ileana Roup, MD;  Location: [facility];  Service: General;  Laterality: Left;   LEFT HEART CATH AND CORONARY ANGIOGRAPHY N/A 11/15/2019   Procedure: LEFT HEART CATH AND CORONARY ANGIOGRAPHY;  Surgeon: Burnell Blanks, MD;  Location: [facility];  Service: Cardiovascular;  Laterality: N/A;   TOTAL HIP ARTHROPLASTY Left 02/19/2021   Procedure: LEFT TOTAL HIP ARTHROPLASTY ANTERIOR APPROACH;  Surgeon: Leandrew Koyanagi, MD;   Location: [facility];  Service: Orthopedics;  Laterality: Left;  3-C   WRIST FRACTURE SURGERY Right 1990s    Current Outpatient Medications  Medication Sig Dispense Refill   acetaminophen (TYLENOL) 500 MG tablet Take 2 tablets (1,000 mg total) by mouth every 8 (eight) hours as needed for mild pain. 30 tablet 0   alprazolam (XANAX) 2 MG tablet Take 2 mg by mouth 3 (three) times daily.     amiodarone (PACERONE) 200 MG tablet TAKE 1 TABLET ONCE DAILY. (Patient taking differently: Take 200 mg by mouth daily.) 30 tablet 11   apixaban (ELIQUIS) 5 MG TABS tablet Take 1 tablet (5 mg total) by mouth 2 (two) times daily. 60 tablet 1   atorvastatin (LIPITOR) 80 MG tablet Take 1 tablet (80 mg total) by mouth daily at 6 PM. (Patient  taking differently: Take 80 mg by mouth daily at 6 PM.) 90 tablet 1   bisoprolol (ZEBETA) 5 MG tablet TAKE 1 TABLET ONCE DAILY. (Patient taking differently: Take 5 mg by mouth daily.) 90 tablet 1   budesonide-formoterol (SYMBICORT) 160-4.5 MCG/ACT inhaler Inhale 2 puffs into the lungs 2 (two) times daily as needed (shortness of breath).     cetirizine (ZYRTEC) 10 MG tablet Take 10 mg by mouth daily.     diltiazem (CARDIZEM CD) 240 MG 24 hr capsule Take 1 capsule (240 mg total) by mouth daily. 90 capsule 1   docusate sodium (COLACE) 100 MG capsule Take 1 capsule (100 mg total) by mouth daily as needed. 30 capsule 2   isosorbide mononitrate (IMDUR) 60 MG 24 hr tablet Take 1 tablet (60 mg total) by mouth daily. 90 tablet 1   lactulose (CHRONULAC) 10 GM/15ML solution Take 15 mLs by mouth 2 (two) times daily as needed for moderate constipation.     levalbuterol (XOPENEX HFA) 45 MCG/ACT inhaler Inhale 2 puffs into the lungs every 6 (six) hours as needed for wheezing or shortness of breath.     levalbuterol (XOPENEX) 0.63 MG/3ML nebulizer solution Take 0.63 mg by nebulization every 6 (six) hours as needed for shortness of breath or wheezing.     metFORMIN (GLUCOPHAGE) 1000 MG tablet Take  1,000 mg by mouth daily.     montelukast (SINGULAIR) 10 MG tablet Take 10 mg by mouth every morning.      nitroGLYCERIN (NITROSTAT) 0.4 MG SL tablet Place 1 tablet (0.4 mg total) under the tongue every 5 (five) minutes x 3 doses as needed for chest pain. 25 tablet 2   oxyCODONE 10 MG TABS Take 0.5-1 tablets (5-10 mg total) by mouth every 6 (six) hours as needed for moderate pain or severe pain. 20 tablet 0   pantoprazole (PROTONIX) 40 MG tablet Take 40 mg by mouth daily.     sucralfate (CARAFATE) 1 g tablet Take 1 tablet (1 g total) by mouth 4 (four) times daily -  with meals and at bedtime. (Patient taking differently: Take 1 g by mouth 2 (two) times daily.) 30 tablet 0   tamsulosin (FLOMAX) 0.4 MG CAPS capsule Take 1 capsule (0.4 mg total) by mouth daily after breakfast. 30 capsule 0   No current facility-administered medications for this visit.    Allergies:   Prednisone   Social History:  The patient  reports that he has been smoking cigarettes. He has a 30.00 pack-year smoking history. He has never used smokeless tobacco. He reports that he does not drink alcohol and does not use drugs.   Family History:  The patient's family history includes Clotting disorder in his father; Diabetes in his father; Heart attack (age of onset: 36) in his father; Hypertension in his father.  ROS:  Please see the history of present illness.    All other systems are reviewed and otherwise negative.   PHYSICAL EXAM:  VS:  There were no vitals taken for this visit. BMI: There is no height or weight on file to calculate BMI. Well nourished, well developed, in no acute distress HEENT: normocephalic, atraumatic Neck: no JVD, carotid bruits or masses Cardiac:  *** RRR; no significant murmurs, no rubs, or gallops Lungs:  *** CTA b/l, no wheezing, rhonchi or rales Abd: soft, nontender MS: no obvious deformity or atrophy Ext: *** no edema Skin: warm and dry, no rash Neuro:  No gross deficits  appreciated Psych: euthymic mood, full affect  EKG:  Done today and reviewed by myself shows  ***   11/15/19: TTE IMPRESSIONS   1. Left ventricular ejection fraction, by estimation, is 45 to 50%. The  left ventricle has mildly decreased function. The left ventricle  demonstrates regional wall motion abnormalities (see scoring  diagram/findings for description). Left ventricular  diastolic parameters are indeterminate. There is mild hypokinesis of the  left ventricular, basal-mid anteroseptal wall and anterior segment. There  is mild hypokinesis of the left ventricular, mid-apical inferoseptal wall.   2. Right ventricular systolic function is low normal. The right  ventricular size is normal.   3. The mitral valve is normal in structure. Trivial mitral valve  regurgitation. No evidence of mitral stenosis.   4. The aortic valve was not well visualized. Aortic valve regurgitation  is not visualized. No aortic stenosis is present.   5. The inferior vena cava is normal in size with <50% respiratory  variability, suggesting right atrial pressure of 8 mmHg.    11/15/2019: LHC/PCI Prox RCA lesion is 40% stenosed. Dist RCA lesion is 99% stenosed. Prox LAD to Mid LAD lesion is 80% stenosed. A drug-eluting stent was successfully placed using a SYNERGY XD 3.50X16. Post intervention, there is a 0% residual stenosis. A drug-eluting stent was successfully placed using a SYNERGY XD 4.0X20. Post intervention, there is a 0% residual stenosis. The left ventricular ejection fraction is 50-55% by visual estimate. The left ventricular systolic function is normal. LV end diastolic pressure is normal. There is no mitral valve regurgitation.   1. Severe stenosis distal RCA. Successful PTCA/DES x 1 distal RCA 2. Severe stenosis proximal LAD. Successful PTCA/DES x 1 proximal LAD 3. Low normal LV systolic function.   Recommendations: DAPT with ASA and Plavix for one month. Since he is also no  Eliquis, can stop ASA after one month and continue Plavix along with Eliquis. Resume Eliquis tomorrow if no bleeding from radial artery cath site.   03/02/2016: TTE Study Conclusions  - Left ventricle: The cavity size was normal. There was moderate    focal basal and mild concentric hypertrophy. Systolic function    was normal. The estimated ejection fraction was in the range of    50% to 55%. The transmitral flow pattern was normal. Left    ventricular diastolic function parameters were normal.  - Mitral valve: Calcified annulus.  - Right ventricle: The cavity size was mildly dilated. Wall    thickness was normal.     Recent Labs: 03/10/2021: ALT 18 11/14/2021: BUN 9; Creatinine, Ser 0.76; Hemoglobin 10.4; Magnesium 1.8; Platelets 333; Potassium 4.7; Sodium 131  No results found for requested labs within last 8760 hours.   CrCl cannot be calculated (Unknown ideal weight.).   Wt Readings from Last 3 Encounters:  11/11/21 241 lb (109.3 kg)  03/05/21 236 lb (107 kg)  02/19/21 236 lb (107 kg)     Other studies reviewed: Additional studies/records reviewed today include: summarized above  ASSESSMENT AND PLAN:  Paroxysmal AF SVT WPW CHA2DS2Vasc is *** 4 He denies any kind of cardiac awareness *** On amiodarone *** No pre excitation on his EKG   4. CAD *** No symptoms of angina On BB, nitrate, statin  5. ICM *** Has never had clinical CHF No symptoms or exam findings of volume OL        Disposition: ***   Current medicines are reviewed at length with the patient today.  The patient did not have any concerns regarding medicines.  Signed, Tommye Standard,  PA-C 12/03/2021 8:36 AM     [facility] [address] [place] [address] [phone] (office)  [phone] (fax)

## 2021-12-05 ENCOUNTER — Ambulatory Visit: Payer: Commercial Managed Care - PPO | Admitting: Physician Assistant

## 2022-03-24 ENCOUNTER — Emergency Department (HOSPITAL_BASED_OUTPATIENT_CLINIC_OR_DEPARTMENT_OTHER)
Admission: EM | Admit: 2022-03-24 | Discharge: 2022-03-24 | Disposition: A | Discharge status: Home / Self Care | Payer: Commercial Managed Care - PPO | Attending: Emergency Medicine | Admitting: Emergency Medicine

## 2022-03-24 ENCOUNTER — Encounter (HOSPITAL_BASED_OUTPATIENT_CLINIC_OR_DEPARTMENT_OTHER): Payer: Self-pay | Admitting: Emergency Medicine

## 2022-03-24 ENCOUNTER — Emergency Department (HOSPITAL_BASED_OUTPATIENT_CLINIC_OR_DEPARTMENT_OTHER): Payer: Commercial Managed Care - PPO

## 2022-03-24 ENCOUNTER — Other Ambulatory Visit: Payer: Self-pay

## 2022-03-24 DIAGNOSIS — R072 Precordial pain: Secondary | ICD-10-CM

## 2022-03-24 DIAGNOSIS — Z79899 Other long term (current) drug therapy: Secondary | ICD-10-CM | POA: Diagnosis not present

## 2022-03-24 DIAGNOSIS — Z7901 Long term (current) use of anticoagulants: Secondary | ICD-10-CM | POA: Insufficient documentation

## 2022-03-24 LAB — BASIC METABOLIC PANEL
Anion gap: 6 (ref 5–15)
BUN: 12 mg/dL (ref 6–20)
CO2: 29 mmol/L (ref 22–32)
Calcium: 9 mg/dL (ref 8.9–10.3)
Chloride: 103 mmol/L (ref 98–111)
Creatinine, Ser: 1.03 mg/dL (ref 0.61–1.24)
GFR, Estimated: >60 mL/min (ref >60)
Glucose, Bld: 105 mg/dL — ABNORMAL HIGH (ref 70–99)
Potassium: 4.7 mmol/L (ref 3.5–5.1)
Sodium: 138 mmol/L (ref 135–145)

## 2022-03-24 LAB — CBC
HCT: 44.9 % (ref 39.0–52.0)
Hemoglobin: 15.5 g/dL (ref 13.0–17.0)
MCH: 31.7 pg (ref 26.0–34.0)
MCHC: 34.5 g/dL (ref 30.0–36.0)
MCV: 91.8 fL (ref 80.0–100.0)
Platelets: 294 10*3/uL (ref 150–400)
RBC: 4.89 MIL/uL (ref 4.22–5.81)
RDW: 12.5 % (ref 11.5–15.5)
WBC: 11.6 10*3/uL — ABNORMAL HIGH (ref 4.0–10.5)
nRBC: 0 % (ref 0.0–0.2)

## 2022-03-24 LAB — TROPONIN I (HIGH SENSITIVITY)
Troponin I (High Sensitivity): 4 ng/L (ref <18)
Troponin I (High Sensitivity): 4 ng/L (ref <18)

## 2022-03-24 MED ORDER — ONDANSETRON HCL 4 MG/2ML IJ SOLN
4.0000 mg | Freq: Once | INTRAMUSCULAR | Status: AC
  Administered 2022-03-24: 4 mg via INTRAVENOUS
  Filled 2022-03-24: qty 2

## 2022-03-24 MED ORDER — HYDROCODONE-ACETAMINOPHEN 5-325 MG PO TABS: 1.0000 | ORAL_TABLET | Freq: Four times a day (QID) | ORAL | 0 refills | Status: AC | PRN

## 2022-03-24 MED ORDER — CYCLOBENZAPRINE HCL 10 MG PO TABS: 10.0000 mg | ORAL_TABLET | Freq: Three times a day (TID) | ORAL | 0 refills | Status: AC

## 2022-03-24 MED ORDER — MORPHINE SULFATE (PF) 2 MG/ML IV SOLN
2.0000 mg | Freq: Once | INTRAVENOUS | Status: AC
  Administered 2022-03-24: 2 mg via INTRAVENOUS
  Filled 2022-03-24: qty 1

## 2022-03-24 NOTE — ED Triage Notes (Signed)
Left chest sharp pain x 3 days , denies shortness of breath . Nausea . Pain radiating to left arm. Hx MI on 2021

## 2022-03-24 NOTE — ED Provider Notes (Signed)
[facility] EMERGENCY DEPARTMENT Provider Note   CSN: [id number] Arrival date & time: 03/24/22  1757     History  Chief Complaint  Patient presents with   Chest Pain    Dean [name] is a 51 y.o. male.  Patient with a complaint of left substernal chest pain this been constant since Sunday.  Radiates into the left arm.  Patient thought maybe he pulled a muscle.  Some increased discomfort with movement of the arm but it is there even if he does not move.  Some mild nausea but it was pre-existing the onset of the pain.  No shortness of breath.  Patient had 2 stents in 2021.  Patient has a history of atrial fibrillation.  Patient followed by [facility] cardiology.       Home Medications Prior to Admission medications   Medication Sig Start Date End Date Taking? Authorizing Provider  cyclobenzaprine (FLEXERIL) 10 MG tablet Take 1 tablet (10 mg total) by mouth 3 (three) times daily. 03/24/22  Yes Vanetta Mulders, MD  HYDROcodone-acetaminophen (NORCO/VICODIN) 5-325 MG tablet Take 1 tablet by mouth every 6 (six) hours as needed for moderate pain. 03/24/22  Yes Vanetta Mulders, MD  acetaminophen (TYLENOL) 500 MG tablet Take 2 tablets (1,000 mg total) by mouth every 8 (eight) hours as needed for mild pain. 11/14/21   Meuth, Lina Sar, PA-C  alprazolam Prudy Feeler) 2 MG tablet Take 2 mg by mouth 3 (three) times daily.    [provider]  amiodarone (PACERONE) 200 MG tablet TAKE 1 TABLET ONCE DAILY. Patient taking differently: Take 200 mg by mouth daily. 12/14/16   Marinus Maw, MD  apixaban (ELIQUIS) 5 MG TABS tablet Take 1 tablet (5 mg total) by mouth 2 (two) times daily. 11/14/21   Meuth, Brooke A, PA-C  atorvastatin (LIPITOR) 80 MG tablet Take 1 tablet (80 mg total) by mouth daily at 6 PM. Patient taking differently: Take 80 mg by mouth daily at 6 PM. 11/17/19   Arty Baumgartner, NP  bisoprolol (ZEBETA) 5 MG tablet TAKE 1 TABLET ONCE DAILY. Patient taking differently: Take 5 mg by  mouth daily. 05/22/20   Marinus Maw, MD  budesonide-formoterol [facility]) 160-4.5 MCG/ACT inhaler Inhale 2 puffs into the lungs 2 (two) times daily as needed (shortness of breath).    [provider]  cetirizine (ZYRTEC) 10 MG tablet Take 10 mg by mouth daily.    [provider]  diltiazem (CARDIZEM CD) 240 MG 24 hr capsule Take 1 capsule (240 mg total) by mouth daily. 05/22/20   Marinus Maw, MD  isosorbide mononitrate (IMDUR) 60 MG 24 hr tablet Take 1 tablet (60 mg total) by mouth daily. 05/22/20   Marinus Maw, MD  lactulose (CHRONULAC) 10 GM/15ML solution Take 15 mLs by mouth 2 (two) times daily as needed for moderate constipation. 12/21/20   [provider]  levalbuterol Pauline Aus HFA) 45 MCG/ACT inhaler Inhale 2 puffs into the lungs every 6 (six) hours as needed for wheezing or shortness of breath.    [provider]  levalbuterol Pauline Aus) 0.63 MG/3ML nebulizer solution Take 0.63 mg by nebulization every 6 (six) hours as needed for shortness of breath or wheezing. 10/29/21   [provider]  metFORMIN (GLUCOPHAGE) 1000 MG tablet Take 1,000 mg by mouth daily. 08/17/15   [provider]  montelukast (SINGULAIR) 10 MG tablet Take 10 mg by mouth every morning.     [provider]  nitroGLYCERIN (NITROSTAT) 0.4 MG SL  tablet Place 1 tablet (0.4 mg total) under the tongue every 5 (five) minutes x 3 doses as needed for chest pain. 11/17/19   Arty Baumgartner, NP  oxyCODONE 10 MG TABS Take 0.5-1 tablets (5-10 mg total) by mouth every 6 (six) hours as needed for moderate pain or severe pain. 11/14/21   Meuth, Brooke A, PA-C  pantoprazole (PROTONIX) 40 MG tablet Take 40 mg by mouth daily. 09/26/19   [provider]  sucralfate (CARAFATE) 1 g tablet Take 1 tablet (1 g total) by mouth 4 (four) times daily -  with meals and at bedtime. Patient taking differently: Take 1 g by mouth 2 (two) times daily. 07/09/17   Rolan Bucco, MD   tamsulosin (FLOMAX) 0.4 MG CAPS capsule Take 1 capsule (0.4 mg total) by mouth daily after breakfast. 11/15/21   Meuth, Lina Sar, PA-C      Allergies    Prednisone    Review of Systems   Review of Systems  Constitutional:  Negative for chills and fever.  HENT:  Negative for ear pain and sore throat.   Eyes:  Negative for pain and visual disturbance.  Respiratory:  Negative for cough and shortness of breath.   Cardiovascular:  Positive for chest pain. Negative for palpitations.  Gastrointestinal:  Positive for nausea. Negative for abdominal pain and vomiting.  Genitourinary:  Negative for dysuria and hematuria.  Musculoskeletal:  Negative for arthralgias and back pain.  Skin:  Negative for color change and rash.  Neurological:  Negative for seizures and syncope.  All other systems reviewed and are negative.   Physical Exam Updated Vital Signs BP 111/64   Pulse (!) 46   Temp 97.7 F (36.5 C) (Oral)   Resp 10   Ht 2.007 m (6\' 7" )   Wt 113.9 kg   SpO2 97%   BMI 28.28 kg/m  Physical Exam Vitals and nursing note reviewed.  Constitutional:      General: He is not in acute distress.    Appearance: He is well-developed.  HENT:     Head: Normocephalic and atraumatic.  Eyes:     Conjunctiva/sclera: Conjunctivae normal.  Cardiovascular:     Rate and Rhythm: Normal rate and regular rhythm.     Heart sounds: No murmur heard. Pulmonary:     Effort: Pulmonary effort is normal. No respiratory distress.     Breath sounds: Normal breath sounds. No decreased breath sounds, wheezing or rales.  Chest:     Chest wall: No tenderness.  Abdominal:     Palpations: Abdomen is soft.     Tenderness: There is no abdominal tenderness.  Musculoskeletal:        General: No swelling.     Cervical back: Neck supple.     Right lower leg: No edema.     Left lower leg: No edema.  Skin:    General: Skin is warm and dry.     Capillary Refill: Capillary refill takes less than 2 seconds.   Neurological:     Mental Status: He is alert.  Psychiatric:        Mood and Affect: Mood normal.     ED Results / Procedures / Treatments   Labs (all labs ordered are listed, but only abnormal results are displayed) Labs Reviewed  BASIC METABOLIC PANEL - Abnormal; Notable for the following components:      Result Value   Glucose, Bld 105 (*)    All other components within normal limits  CBC - Abnormal;  Notable for the following components:   WBC 11.6 (*)    All other components within normal limits  TROPONIN I (HIGH SENSITIVITY)  TROPONIN I (HIGH SENSITIVITY)    EKG EKG Interpretation  Date/Time:  Tuesday March 24 2022 18:05:46 EDT Ventricular Rate:  48 PR Interval:  202 QRS Duration: 112 QT Interval:  473 QTC Calculation: 423 R Axis:   -77 Text Interpretation: Sinus bradycardia Left anterior fascicular block Probable anteroseptal infarct, old Confirmed by Vanetta Mulders [phone]) on 03/24/2022 6:15:06 PM  Radiology DG Chest Port 1 View  Result Date: 03/24/2022 CLINICAL DATA:  Chest pain EXAM: PORTABLE CHEST 1 VIEW COMPARISON:  02/12/2021 FINDINGS: Poor inspiration. Heart size is normal. Mediastinal shadows are normal. The lungs are clear. The vascularity is normal. No effusions. IMPRESSION: Poor inspiration.  No active disease. Electronically Signed   By: Paulina Fusi M.D.   On: 03/24/2022 18:43    Procedures Procedures    Medications Ordered in ED Medications  ondansetron (ZOFRAN) injection 4 mg (4 mg Intravenous Given 03/24/22 2002)  morphine (PF) 2 MG/ML injection 2 mg (2 mg Intravenous Given 03/24/22 2003)    ED Course/ Medical Decision Making/ A&P                           Medical Decision Making Amount and/or Complexity of Data Reviewed Labs: ordered. Radiology: ordered.  Risk Prescription drug management.  Work-up for the constant chest pain since "Sunday.  Without any changes in the troponins.  Chest x-ray negative labs without any significant  abnormalities.  EKG with sinus bradycardia with left anterior fascicular block.  Probable anterior septal infarct.  No old EKG here for comparison.  Patient can follow-up with cardiology.  We will treat with Flexeril and hydrocodone as if it could be chest wall pain.  Patient's oxygen sats are good no shortness of breath no concerns for pulmonary embolus.  Final Clinical Impression(s) / ED Diagnoses Final diagnoses:  Precordial pain    Rx / DC Orders ED Discharge Orders          Ordered    HYDROcodone-acetaminophen (NORCO/VICODIN) 5-325 MG tablet  Every 6 hours PRN        03/24/22 2100    cyclobenzaprine (FLEXERIL) 10 MG tablet  3 times daily        08" /08/23 2100              2101, MD 03/24/22 2105

## 2022-03-24 NOTE — ED Notes (Signed)
EKG given to EDP.  

## 2022-03-24 NOTE — Discharge Instructions (Signed)
Work-up here today reassuring despite the constant chest pain since Sunday.  Work-up was negative for any acute changes.  Recommend you follow-up with cardiology.  Trial of the hydrocodone and Flexeril in case this is musculoskeletal in nature.  Continue all your current medicines.

## 2022-07-05 IMAGING — RF DG HIP (WITH PELVIS) OPERATIVE*L*
1 series · 4 of 4 positions shown · non-contrast
Comparison: Radiograph 02/06/2021

CLINICAL DATA: Left hip arthroplasty.

EXAM:
OPERATIVE LEFT HIP (WITH PELVIS IF PERFORMED)
TECHNIQUE: Fluoroscopic spot image(s) were submitted for interpretation
post-operatively.

[Series 1: unknown protocol · 0.20mm/px · 4 of 4 slices shown]
[im 1/4]
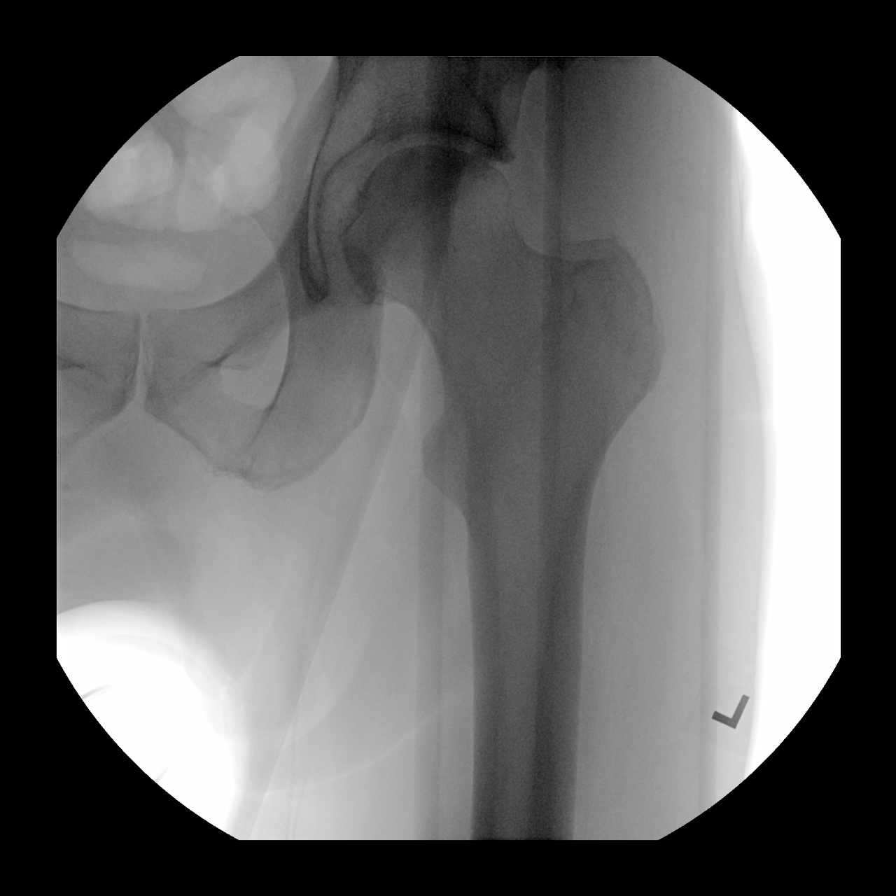
[im 2/4]
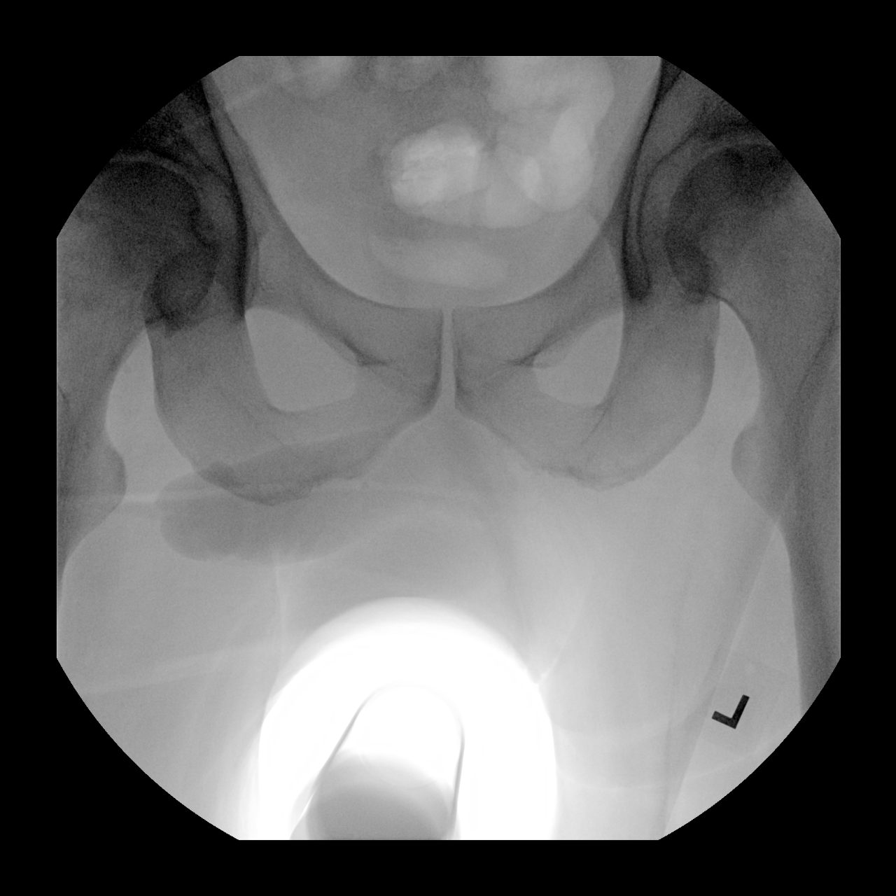
[im 3/4]
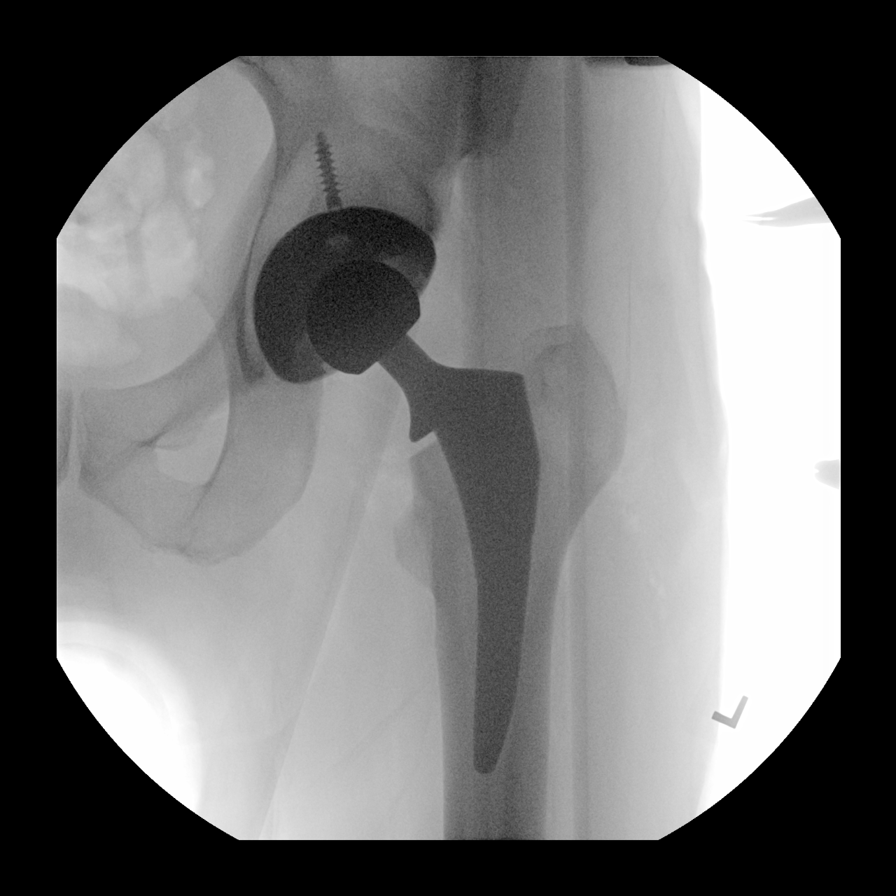
[im 4/4]
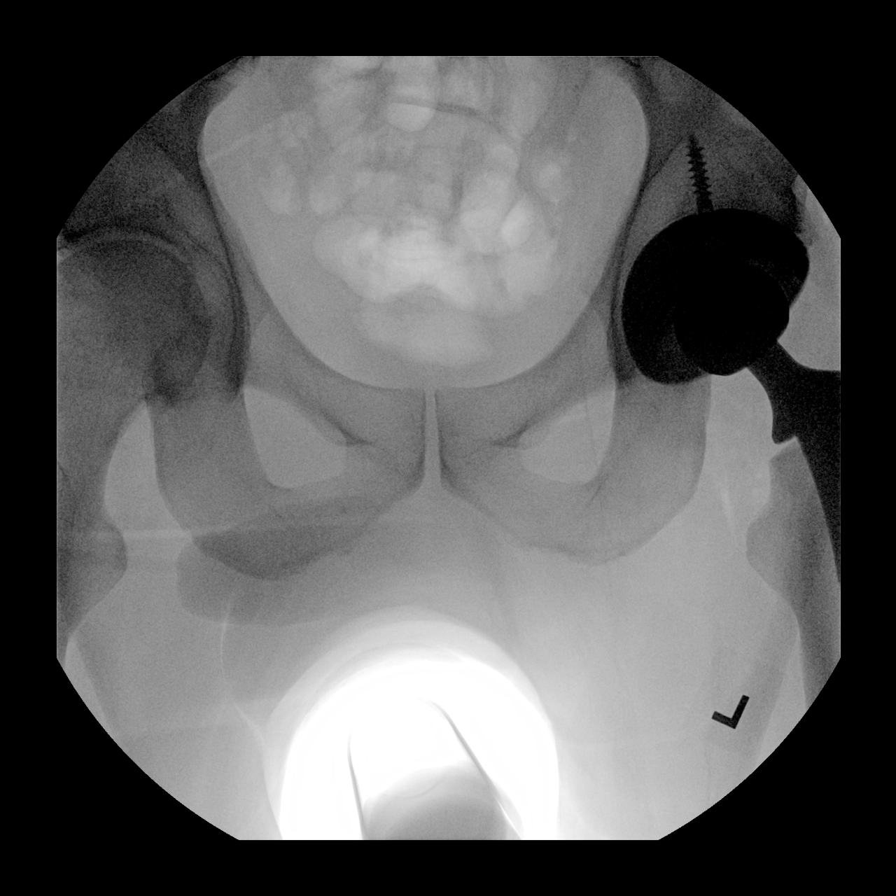

[4 of 4 positions shown; findings below may reference images not displayed]

FINDINGS: Four fluoroscopic spot views of the pelvis and left hip obtained in
the operating room. Left hip arthroplasty placed. Fluoroscopy time
25 seconds. Dose 3.97 mGy.
IMPRESSION: Procedural fluoroscopy during left hip arthroplasty.

## 2022-07-05 IMAGING — DX DG PORTABLE PELVIS
1 series · 1 of 1 positions shown · non-contrast
Comparison: 02/06/2021

CLINICAL DATA: Postop

EXAM:
PORTABLE PELVIS 1-2 VIEWS

[pelvis ap]
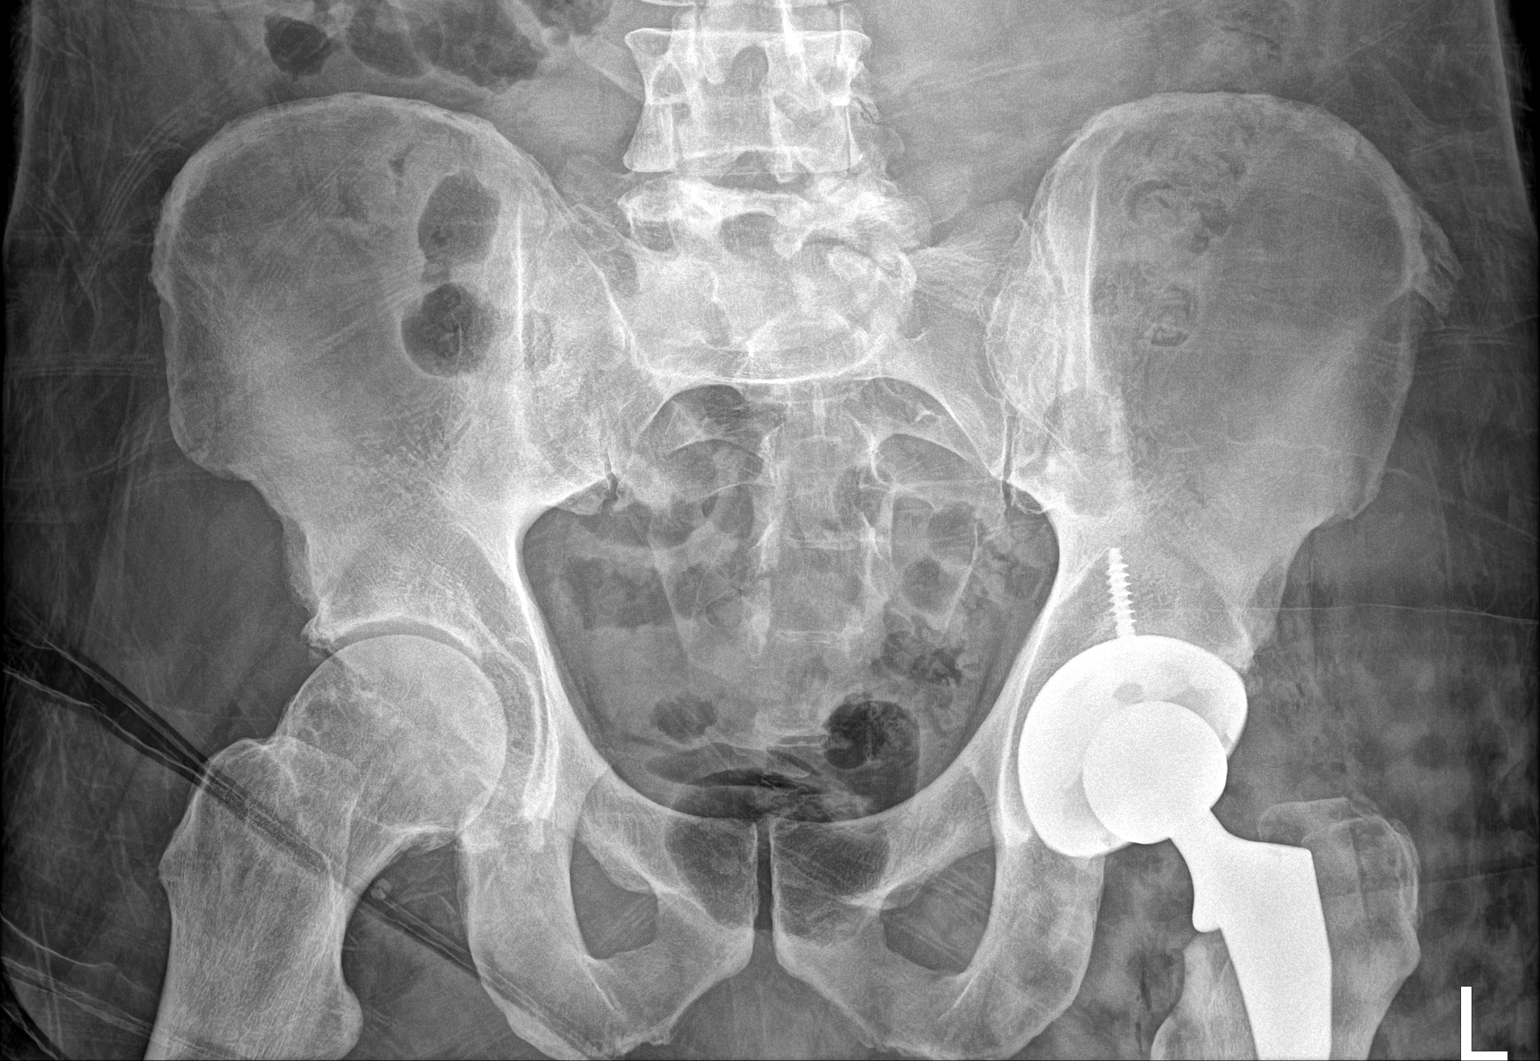

[1 of 1 positions shown; findings below may reference images not displayed]

FINDINGS: Interval left hip replacement with incompletely visualized femoral
stem. Alignment at left hip is normal. Pubic symphysis and rami
appear intact.
IMPRESSION: Status post left hip replacement.

## 2022-07-21 ENCOUNTER — Emergency Department (HOSPITAL_COMMUNITY)
Admission: EM | Admit: 2022-07-21 | Discharge: 2022-07-21 | Discharge status: Against Medical Advice | Payer: Commercial Managed Care - PPO | Attending: Student | Admitting: Student

## 2022-07-21 ENCOUNTER — Other Ambulatory Visit: Payer: Self-pay

## 2022-07-21 DIAGNOSIS — R1032 Left lower quadrant pain: Secondary | ICD-10-CM | POA: Diagnosis present

## 2022-07-21 DIAGNOSIS — R110 Nausea: Secondary | ICD-10-CM | POA: Insufficient documentation

## 2022-07-21 DIAGNOSIS — Z5321 Procedure and treatment not carried out due to patient leaving prior to being seen by health care provider: Secondary | ICD-10-CM | POA: Insufficient documentation

## 2022-07-21 LAB — CBC WITH DIFFERENTIAL/PLATELET
Abs Immature Granulocytes: 0.17 10*3/uL — ABNORMAL HIGH (ref 0.00–0.07)
Basophils Absolute: 0.1 10*3/uL (ref 0.0–0.1)
Basophils Relative: 0 %
Eosinophils Absolute: 0.1 10*3/uL (ref 0.0–0.5)
Eosinophils Relative: 1 %
HCT: 41.2 % (ref 39.0–52.0)
Hemoglobin: 13.6 g/dL (ref 13.0–17.0)
Immature Granulocytes: 1 %
Lymphocytes Relative: 20 %
Lymphs Abs: 2.4 10*3/uL (ref 0.7–4.0)
MCH: 31.4 pg (ref 26.0–34.0)
MCHC: 33 g/dL (ref 30.0–36.0)
MCV: 95.2 fL (ref 80.0–100.0)
Monocytes Absolute: 0.6 10*3/uL (ref 0.1–1.0)
Monocytes Relative: 5 %
Neutro Abs: 9.2 10*3/uL — ABNORMAL HIGH (ref 1.7–7.7)
Neutrophils Relative %: 73 %
Platelets: 261 10*3/uL (ref 150–400)
RBC: 4.33 MIL/uL (ref 4.22–5.81)
RDW: 12.6 % (ref 11.5–15.5)
WBC: 12.5 10*3/uL — ABNORMAL HIGH (ref 4.0–10.5)
nRBC: 0 % (ref 0.0–0.2)

## 2022-07-21 LAB — COMPREHENSIVE METABOLIC PANEL
ALT: 17 U/L (ref 0–44)
AST: 18 U/L (ref 15–41)
Albumin: 4 g/dL (ref 3.5–5.0)
Alkaline Phosphatase: 86 U/L (ref 38–126)
Anion gap: 7 (ref 5–15)
BUN: 10 mg/dL (ref 6–20)
CO2: 28 mmol/L (ref 22–32)
Calcium: 9 mg/dL (ref 8.9–10.3)
Chloride: 105 mmol/L (ref 98–111)
Creatinine, Ser: 0.81 mg/dL (ref 0.61–1.24)
GFR, Estimated: >60 mL/min (ref >60)
Glucose, Bld: 129 mg/dL — ABNORMAL HIGH (ref 70–99)
Potassium: 4.6 mmol/L (ref 3.5–5.1)
Sodium: 140 mmol/L (ref 135–145)
Total Bilirubin: 0.4 mg/dL (ref 0.3–1.2)
Total Protein: 6.9 g/dL (ref 6.5–8.1)

## 2022-07-21 LAB — CBG MONITORING, ED: Glucose-Capillary: 115 mg/dL — ABNORMAL HIGH (ref 70–99)

## 2022-07-21 LAB — LIPASE, BLOOD: Lipase: 24 U/L (ref 11–51)

## 2022-07-21 NOTE — ED Triage Notes (Signed)
Pt reports abdominal pain and nausea for months "since being septic back in March"

## 2022-07-21 NOTE — ED Provider Triage Note (Signed)
Emergency Medicine Provider Triage Evaluation Note  Dean [name] , a 51 y.o. male  was evaluated in triage.  Pt complains of abdominal pain for months since he was septic in March. States he is nauseated daily. States he throws up intermittently. States anytime he eats or wakes up he will be nauseated. Liquids do not make him nauseated. Complains of mild LUQ abdominal pain. Has intermittent chills without objective fever. Denies diarrhea, dysuria, previous abdominal surgeries. Denies chronic ibuprofen/NSAID, tylenol use. Denies chronic alcohol use. 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   Review of Systems  Positive: See above Negative:   Physical Exam  BP (!) 165/87 (BP Location: Left Arm)   Pulse (!) 54   Temp 98.5 F (36.9 C) (Oral)   Resp 18   Ht 6\' 6"  (1.981 m)   Wt 108.9 kg   SpO2 98%   BMI 27.73 kg/m  Gen:   Awake, no distress   Resp:  Normal effort  MSK:   Moves extremities without difficulty  Other:    Medical Decision Making  Medically screening exam initiated at 12:03 PM.  Appropriate orders placed.  Dean [name] was informed that the remainder of the evaluation will be completed by another provider, this initial triage assessment does not replace that evaluation, and the importance of remaining in the ED until their evaluation is complete.     Dean Flood, PA-C 07/21/22 1208
